# Patient Record
Sex: Male | Born: 1945
Health system: Southern US, Community
[De-identification: ages and names within clinical notes are randomized; demographics above are authoritative.]

## PROBLEM LIST (undated history)

## (undated) DIAGNOSIS — R7303 Prediabetes: Secondary | ICD-10-CM

## (undated) DIAGNOSIS — Z973 Presence of spectacles and contact lenses: Secondary | ICD-10-CM

## (undated) DIAGNOSIS — F319 Bipolar disorder, unspecified: Secondary | ICD-10-CM

## (undated) DIAGNOSIS — F32A Depression, unspecified: Secondary | ICD-10-CM

## (undated) DIAGNOSIS — Z8739 Personal history of other diseases of the musculoskeletal system and connective tissue: Secondary | ICD-10-CM

## (undated) DIAGNOSIS — Z9989 Dependence on other enabling machines and devices: Secondary | ICD-10-CM

## (undated) DIAGNOSIS — N401 Enlarged prostate with lower urinary tract symptoms: Secondary | ICD-10-CM

## (undated) DIAGNOSIS — F329 Major depressive disorder, single episode, unspecified: Secondary | ICD-10-CM

## (undated) DIAGNOSIS — C61 Malignant neoplasm of prostate: Secondary | ICD-10-CM

## (undated) DIAGNOSIS — I10 Essential (primary) hypertension: Secondary | ICD-10-CM

## (undated) DIAGNOSIS — G4733 Obstructive sleep apnea (adult) (pediatric): Secondary | ICD-10-CM

## (undated) DIAGNOSIS — G542 Cervical root disorders, not elsewhere classified: Secondary | ICD-10-CM

## (undated) DIAGNOSIS — M1990 Unspecified osteoarthritis, unspecified site: Secondary | ICD-10-CM

## (undated) HISTORY — DX: Essential (primary) hypertension: I10

## (undated) HISTORY — PX: OTHER SURGICAL HISTORY: SHX169

## (undated) HISTORY — PX: CIRCUMCISION: SUR203

## (undated) HISTORY — PX: TONSILLECTOMY: SUR1361

## (undated) HISTORY — PX: PROSTATE BIOPSY: SHX241

---

## 2006-03-04 HISTORY — PX: KNEE SURGERY: SHX244

## 2011-05-27 ENCOUNTER — Encounter: Payer: Self-pay | Admitting: Neurology

## 2011-05-27 ENCOUNTER — Ambulatory Visit (INDEPENDENT_AMBULATORY_CARE_PROVIDER_SITE_OTHER): Payer: Self-pay | Admitting: Neurology

## 2011-05-27 VITALS — BP 148/88 | HR 88 | Wt 213.0 lb

## 2011-05-27 DIAGNOSIS — M5412 Radiculopathy, cervical region: Secondary | ICD-10-CM

## 2011-05-27 DIAGNOSIS — M5410 Radiculopathy, site unspecified: Secondary | ICD-10-CM

## 2011-05-27 NOTE — Patient Instructions (Addendum)
Your NCV/EMG is scheduled at [facility] located at [address] in [place] on Monday, April 15th at 9:30 am. Please arrive 15 minutes prior to your scheduled appointment.  [phone]    Your MRI is scheduled at [facility] located at [address] (beside the hospital) on Wednesday, March 27th at 11:45.  Please arrive 15 minutes prior to your appointment.  [phone].
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
## 2011-05-29 ENCOUNTER — Ambulatory Visit (HOSPITAL_COMMUNITY)
Admission: RE | Admit: 2011-05-29 | Discharge: 2011-05-29 | Disposition: A | Discharge status: Home / Self Care | Payer: Medicare Other | Source: Ambulatory Visit | Attending: Neurology | Admitting: Neurology

## 2011-05-29 ENCOUNTER — Other Ambulatory Visit: Payer: Self-pay | Admitting: Neurology

## 2011-05-29 DIAGNOSIS — Z9889 Other specified postprocedural states: Secondary | ICD-10-CM

## 2011-05-29 DIAGNOSIS — M5020 Other cervical disc displacement, unspecified cervical region: Secondary | ICD-10-CM | POA: Insufficient documentation

## 2011-05-29 DIAGNOSIS — M79609 Pain in unspecified limb: Secondary | ICD-10-CM | POA: Insufficient documentation

## 2011-05-29 DIAGNOSIS — R209 Unspecified disturbances of skin sensation: Secondary | ICD-10-CM | POA: Insufficient documentation

## 2011-05-29 DIAGNOSIS — M5410 Radiculopathy, site unspecified: Secondary | ICD-10-CM

## 2011-06-21 ENCOUNTER — Telehealth: Payer: Self-pay | Admitting: Neurology

## 2011-06-21 NOTE — Progress Notes (Signed)
EMG/NCS revealed left C7-C8 chronic radic.

## 2011-06-21 NOTE — Telephone Encounter (Signed)
Message copied by Benay Spice on Fri Jun 21, 2011  2:52 PM ------      Message from: Milas Gain      Created: Fri Jun 21, 2011  8:37 AM       Let pt know that his MRI of his C-spine revealed arthritis of the neck and EMG confirmed that that arthritis is likely pushing on a nerve root in his neck.  If he is still having pain I would recommend epidural steroid injections - would he like to have [place] set that up.

## 2011-06-21 NOTE — Telephone Encounter (Signed)
Left a message for the patient to return my call.  

## 2011-06-24 ENCOUNTER — Other Ambulatory Visit: Payer: Self-pay | Admitting: Neurology

## 2011-06-24 DIAGNOSIS — M79602 Pain in left arm: Secondary | ICD-10-CM

## 2011-06-24 DIAGNOSIS — R200 Anesthesia of skin: Secondary | ICD-10-CM

## 2011-06-24 NOTE — Telephone Encounter (Signed)
Called and spoke with the patient earlier today. He does wish to proceed with the ESI. Order placed in EPIC and then called Danielle at [facility] to schedule. She will call the patient to set that up and then call me back with the appointment date and time.

## 2011-06-24 NOTE — Telephone Encounter (Signed)
The patient is scheduled for the [facility] tomorrow at 0815.

## 2011-06-25 ENCOUNTER — Other Ambulatory Visit: Payer: Medicare Other

## 2011-06-25 ENCOUNTER — Ambulatory Visit
Admission: RE | Admit: 2011-06-25 | Discharge: 2011-06-25 | Disposition: A | Discharge status: Home / Self Care | Payer: Medicare Other | Source: Ambulatory Visit | Attending: Neurology | Admitting: Neurology

## 2011-06-25 VITALS — BP 144/74 | HR 79

## 2011-06-25 DIAGNOSIS — M79602 Pain in left arm: Secondary | ICD-10-CM

## 2011-06-25 DIAGNOSIS — M5126 Other intervertebral disc displacement, lumbar region: Secondary | ICD-10-CM

## 2011-06-25 DIAGNOSIS — M5020 Other cervical disc displacement, unspecified cervical region: Secondary | ICD-10-CM

## 2011-06-25 DIAGNOSIS — R200 Anesthesia of skin: Secondary | ICD-10-CM

## 2011-06-25 MED ORDER — IOHEXOL 300 MG/ML  SOLN
1.0000 mL | Freq: Once | INTRAMUSCULAR | Status: AC | PRN
  Administered 2011-06-25: 1 mL via EPIDURAL

## 2011-06-25 MED ORDER — TRIAMCINOLONE ACETONIDE 40 MG/ML IJ SUSP (RADIOLOGY)
60.0000 mg | Freq: Once | INTRAMUSCULAR | Status: AC
  Administered 2011-06-25: 60 mg via EPIDURAL

## 2011-06-25 NOTE — Discharge Instructions (Signed)

## 2011-06-26 ENCOUNTER — Encounter: Payer: Self-pay | Admitting: Neurology

## 2011-07-01 ENCOUNTER — Other Ambulatory Visit: Payer: Medicare Other

## 2011-07-10 ENCOUNTER — Encounter: Payer: Self-pay | Admitting: Neurology

## 2011-07-10 ENCOUNTER — Ambulatory Visit (INDEPENDENT_AMBULATORY_CARE_PROVIDER_SITE_OTHER): Payer: Medicare Other | Admitting: Neurology

## 2011-07-10 VITALS — BP 144/70 | HR 92 | Ht 68.5 in | Wt 211.0 lb

## 2011-07-10 DIAGNOSIS — M5412 Radiculopathy, cervical region: Secondary | ICD-10-CM
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
## 2011-08-04 ENCOUNTER — Emergency Department (HOSPITAL_COMMUNITY): Payer: Medicare Other

## 2011-08-04 ENCOUNTER — Encounter (HOSPITAL_COMMUNITY): Payer: Self-pay | Admitting: Emergency Medicine

## 2011-08-04 ENCOUNTER — Emergency Department (HOSPITAL_COMMUNITY)
Admission: EM | Admit: 2011-08-04 | Discharge: 2011-08-04 | Disposition: A | Discharge status: Home / Self Care | Payer: Medicare Other | Attending: Emergency Medicine | Admitting: Emergency Medicine

## 2011-08-04 DIAGNOSIS — Z79899 Other long term (current) drug therapy: Secondary | ICD-10-CM | POA: Insufficient documentation

## 2011-08-04 DIAGNOSIS — R109 Unspecified abdominal pain: Secondary | ICD-10-CM

## 2011-08-04 DIAGNOSIS — I10 Essential (primary) hypertension: Secondary | ICD-10-CM | POA: Insufficient documentation

## 2011-08-04 MED ORDER — PROMETHAZINE HCL 25 MG PO TABS: 25.0000 mg | ORAL_TABLET | Freq: Four times a day (QID) | ORAL | Status: DC | PRN

## 2011-08-04 NOTE — ED Notes (Signed)
Pt c/o abdominal pain and nausea that began around 7pm. Pt states he feels bloated. Pt denies vomiting or diarrhea at this time. Pt states he attempted to have BM but was unable. Pt states he feels weak.

## 2011-08-04 NOTE — Discharge Instructions (Signed)
Drink plenty of fluids (clear liquids) the next 12-24 hours then start the BRAT diet. . Use the phenergan for nausea or vomiting. Take imodium OTC for diarrhea if it occurs. Avoid mild products until the diarrhea is gone. Recheck if you get worse. ]
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
## 2013-03-12 ENCOUNTER — Ambulatory Visit (HOSPITAL_BASED_OUTPATIENT_CLINIC_OR_DEPARTMENT_OTHER): Payer: Medicare Other | Attending: Neurology

## 2013-03-12 VITALS — Ht 68.0 in | Wt 225.0 lb

## 2013-03-12 DIAGNOSIS — R0989 Other specified symptoms and signs involving the circulatory and respiratory systems: Secondary | ICD-10-CM | POA: Insufficient documentation

## 2013-03-12 DIAGNOSIS — Z9989 Dependence on other enabling machines and devices: Secondary | ICD-10-CM

## 2013-03-12 DIAGNOSIS — G4733 Obstructive sleep apnea (adult) (pediatric): Secondary | ICD-10-CM | POA: Insufficient documentation

## 2013-03-12 DIAGNOSIS — R0609 Other forms of dyspnea: Secondary | ICD-10-CM | POA: Insufficient documentation

## 2013-03-14 DIAGNOSIS — G4733 Obstructive sleep apnea (adult) (pediatric): Secondary | ICD-10-CM

## 2013-03-14 NOTE — Sleep Study (Signed)
   NAME: Paul Moon DATE OF BIRTH:  [birth year] MEDICAL RECORD NUMBER [id number]  LOCATION: [facility]  PHYSICIAN: Mahitha Hickling D  DATE OF STUDY: 03/12/2013  SLEEP STUDY TYPE: Nocturnal Polysomnogram               REFERRING PHYSICIAN: Rosamaria Lints, MD  INDICATION FOR STUDY: Hypersomnia with sleep apnea   EPWORTH SLEEPINESS SCORE:   7/24 HEIGHT: 5\' 8"  (172.7 cm)  WEIGHT: 102.059 kg (225 lb)    Body mass index is 34.22 kg/(m^2).  NECK SIZE: 17 in.  MEDICATIONS: Charted for review  SLEEP ARCHITECTURE: Split study protocol. During the diagnostic phase total sleep time 141 minutes with sleep efficiency 87.9%. Stage I was 2.5%, stage II 76.2%, stage III absent, REM 21.3% of total sleep time. Sleep latency 7 minutes, REM latency 62 minutes, awake after sleep onset 12.5 minutes, arousal index 1.7. Bedtime medication: None  RESPIRATORY DATA: Split study protocol. Apnea hypopneas syndrome (AHI) 26.8 per hour. A total of 63 events was scored including 7 obstructive apneas, 1 central apnea, 3 mixed apneas, 52 hypopneas. He events were not positional. REM AHI 62 per hour. CPAP was titrated to 13 CWP, AHI 0 per hour. He wore a medium ResMed air fit F10 mask with heated humidifier.  OXYGEN DATA: Moderate snoring before CPAP with oxygen desaturation to a nadir of 79% on room. With CPAP control, snoring was prevented and mean oxygen saturation of 92.3%  CARDIAC DATA: Sinus rhythm  MOVEMENT/PARASOMNIA: No significant movement disturbance. Bathroom x1  IMPRESSION/ RECOMMENDATION:   1) Moderate obstructive sleep apnea/hypopneassyndrome, AHI 26.8 per hour with non-positional events. Moderate snoring with oxygen desaturation to a nadir of 79% on room air. 2) Successful CPAP titration to 13 CWP, AHI 0 per hour. He wore a medium ResMed air fit F10 fullface mask with heated humidifier. Snoring was prevented and mean oxygen saturation held 92.3% on room air with CPAP.    Signed Baird Lyons M.D. Deneise Lever Diplomate, American Board of Sleep Medicine  ELECTRONICALLY SIGNED ON:  03/14/2013, 4:08 PM [place] PH: [phone]   FX: [phone] [place]

## 2013-03-22 ENCOUNTER — Ambulatory Visit (INDEPENDENT_AMBULATORY_CARE_PROVIDER_SITE_OTHER): Payer: Medicare Other | Admitting: Diagnostic Neuroimaging

## 2013-03-22 ENCOUNTER — Encounter: Payer: Self-pay | Admitting: Diagnostic Neuroimaging

## 2013-03-22 ENCOUNTER — Encounter (INDEPENDENT_AMBULATORY_CARE_PROVIDER_SITE_OTHER): Payer: Self-pay

## 2013-03-22 VITALS — BP 118/66 | HR 70 | Temp 97.7°F | Ht 68.5 in | Wt 231.5 lb

## 2013-03-22 DIAGNOSIS — R51 Headache: Secondary | ICD-10-CM

## 2013-03-22 DIAGNOSIS — R259 Unspecified abnormal involuntary movements: Secondary | ICD-10-CM

## 2013-03-22 DIAGNOSIS — R251 Tremor, unspecified: Secondary | ICD-10-CM

## 2013-03-22 DIAGNOSIS — R519 Headache, unspecified: Secondary | ICD-10-CM | POA: Insufficient documentation

## 2013-03-22 NOTE — Patient Instructions (Signed)
I will check additional testing. Continue propranolol.

## 2013-03-22 NOTE — Progress Notes (Signed)
[facility]  PATIENT: Paul Moon DOB: 08-Apr-1945  REFERRING CLINICIAN: Noah Delaine HISTORY FROM: patient  REASON FOR VISIT: new consult   HISTORICAL  CHIEF COMPLAINT:  Chief Complaint  Patient presents with  . Tremors    HISTORY OF PRESENT ILLNESS:   68 old right-handed male with history of A., hypertension, here for evaluation of tremor.  6-8 weeks ago patient noted intermittent postural and action tremor left hand. 4 weeks ago this has progressed to his right hand. His handwriting, fine coordination, and is very happy affected. He also notes mild voice tremor, especially at the end of a sentence. Voice tremor also worse at end of day. No change with alcohol use. No significant triggering or alleviating factors. He drinks 2 drinks of caffeine per month.  Patient hasn't some mild balance and walking difficulty, such as uneven surfaces. He has a tendency to stumble. Patient has been talking in his sleep more according to his significant other. No change in smell or taste.   Patient having some nonspecific, intermittent headaches, mild to moderate, new onset for the last 2-3 weeks. No photophobia phonophobia nausea or vomiting.   REVIEW OF SYSTEMS: Full 14 system review of systems performed and notable only for headache weakness slurred speech tremor snoring apnea joint swelling skin moles.  ALLERGIES: No Known Allergies  HOME MEDICATIONS: Outpatient Prescriptions Prior to Visit  Medication Sig Dispense Refill  . aspirin 81 MG tablet Take 81 mg by mouth daily.      . Cyanocobalamin (VITAMIN B 12 PO) Take by mouth.      . dutasteride (AVODART) 0.5 MG capsule Take 0.5 mg by mouth daily.      . fish oil-omega-3 fatty acids 1000 MG capsule Take 2 g by mouth daily.      Marland Kitchen losartan (COZAAR) 100 MG tablet Take 100 mg by mouth daily.      . Multiple Vitamin (MULITIVITAMIN WITH MINERALS) TABS Take 1 tablet by mouth daily.      . tadalafil (CIALIS) 5 MG  tablet Take 5 mg by mouth daily.       . verapamil (VERELAN PM) 180 MG 24 hr capsule Take 180 mg by mouth at bedtime.      . promethazine (PHENERGAN) 25 MG tablet Take 1 tablet (25 mg total) by mouth every 6 (six) hours as needed for nausea.  6 tablet  0   No facility-administered medications prior to visit.    PAST MEDICAL HISTORY: Past Medical History  Diagnosis Date  . Hypertension   . Pneumonia   . Enlarged prostate     PAST SURGICAL HISTORY: Past Surgical History  Procedure Laterality Date  . Knee surgery  2008    right  . Tonsillectomy    . Circumcision      FAMILY HISTORY: Family History  Problem Relation Age of Onset  . Renal cancer Father   . Heart attack Paternal Grandmother   . Heart attack Paternal Grandfather     SOCIAL HISTORY:  History   Social History  . Marital Status: Divorced    Spouse Name: N/A    Number of Children: 2  . Years of Education: College   Occupational History  . Semi-Retired    Social History Main Topics  . Smoking status: Former Smoker    Quit date: 05/26/1985  . Smokeless tobacco: Never Used  . Alcohol Use: Yes     Comment: wine from time to time  . Drug Use: Not on file  .  Sexual Activity: Not on file   Other Topics Concern  . Not on file   Social History Narrative   Patient lives at home alone.   Caffeine Use: occasionally     PHYSICAL EXAM  Filed Vitals:   03/22/13 0856  BP: 118/66  Pulse: 70  Temp: 97.7 F (36.5 C)  TempSrc: Oral  Height: 5' 8.5" (1.74 m)  Weight: 231 lb 8 oz (105.008 kg)    Not recorded    Body mass index is 34.68 kg/(m^2).  GENERAL EXAM: Patient is in no distress; well developed, nourished and groomed; neck is supple  CARDIOVASCULAR: Regular rate and rhythm, no murmurs, no carotid bruits  NEUROLOGIC: MENTAL STATUS: awake, alert, oriented to person, place and time, recent and remote memory intact, normal attention and concentration, language fluent, comprehension intact,  naming intact, fund of knowledge appropriate; NO FRONTAL RELEASE SINGS. CRANIAL NERVE: no papilledema on fundoscopic exam, pupils equal and reactive to light, visual fields full to confrontation, extraocular muscles intact, no nystagmus, facial sensation and strength symmetric, hearing intact, palate elevates symmetrically, uvula midline, shoulder shrug symmetric, tongue midline. MOTOR: normal bulk and tone, full strength in the BUE, BLE; MILD BRADYKINESIA IN BUE. HANDWRITING SAMPLE NOTABLE FOR TREMOR, HESITANCY. SENSORY: normal and symmetric to light touch, pinprick, temperature, vibration; EXCEPT SLIGHTLY DECR PP IN LEFT HAND DIGITS 3-5. COORDINATION: finger-nose-finger, fine finger movements normal REFLEXES: deep tendon reflexes TRACE and symmetric GAIT/STATION: narrow based gait; able to walk on toes, heels and tandem; romberg is negative    DIAGNOSTIC DATA (LABS, IMAGING, TESTING) - I reviewed patient records, labs, notes, testing and imaging myself where available.  No results found for this basename: WBC, HGB, HCT, MCV, PLT   No results found for this basename: na, k, cl, co2, glucose, bun, creatinine, calcium, prot, albumin, ast, alt, alkphos, bilitot, gfrnonaa, gfraa   No results found for this basename: CHOL, HDL, LDLCALC, LDLDIRECT, TRIG, CHOLHDL   No results found for this basename: HGBA1C   No results found for this basename: VITAMINB12   No results found for this basename: TSH     ASSESSMENT AND PLAN  68 y.o. year old male here with postural and action tremor (BUE) x 6-8 weeks. Exam notable for action and postural tremor of bilateral upper extremities with mild bradykinesia in upper extremities. Otherwise unremarkable neurologic exam. We'll proceed with additional testing. New-onset headache + tremor needs MRI scan for evaluation. Continue propranolol for now. May need to increase propranolol to the future or possibly add on primidone.  Ddx: essential tremor vs secondary  tremor (CNS vascular, structural, inflamm, medication side effect, metabolic, neuromuscular junction)  PLAN: Orders Placed This Encounter  Procedures  . MR Brain W Wo Contrast  . TSH  . Acetylcholine Receptor, Binding  . Valproic acid level  . Ammonia  . Lamotrigine level   Return in about 2 months (around 05/20/2013).    Penni Bombard, MD 08/18/735, 1:06 AM Certified in Neurology, Neurophysiology and Neuroimaging  [facility] [address] [place], [place] [address] [phone]

## 2013-03-31 ENCOUNTER — Ambulatory Visit
Admission: RE | Admit: 2013-03-31 | Discharge: 2013-03-31 | Disposition: A | Discharge status: Home / Self Care | Payer: Medicare Other | Source: Ambulatory Visit | Attending: Diagnostic Neuroimaging | Admitting: Diagnostic Neuroimaging

## 2013-03-31 DIAGNOSIS — R51 Headache: Secondary | ICD-10-CM

## 2013-03-31 DIAGNOSIS — R259 Unspecified abnormal involuntary movements: Secondary | ICD-10-CM

## 2013-03-31 DIAGNOSIS — R251 Tremor, unspecified: Secondary | ICD-10-CM

## 2013-03-31 MED ORDER — GADOBENATE DIMEGLUMINE 529 MG/ML IV SOLN
20.0000 mL | Freq: Once | INTRAVENOUS | Status: AC | PRN
  Administered 2013-03-31: 20 mL via INTRAVENOUS

## 2013-04-01 LAB — TSH: TSH: 2.62 u[IU]/mL (ref 0.450–4.500)

## 2013-04-01 LAB — ACETYLCHOLINE RECEPTOR, BINDING: ACHR BINDING AB, SERUM: 0.04 nmol/L (ref 0.00–0.24)

## 2013-04-01 LAB — AMMONIA: Ammonia: 105 ug/dL — ABNORMAL HIGH (ref 27–102)

## 2013-04-01 LAB — VALPROIC ACID LEVEL: VALPROIC ACID LVL: 76 ug/mL (ref 50–100)

## 2013-04-01 LAB — LAMOTRIGINE LEVEL: LAMOTRIGINE LVL: 5.5 ug/mL (ref 2.0–20.0)

## 2013-04-09 ENCOUNTER — Encounter: Payer: Self-pay | Admitting: Internal Medicine

## 2013-04-09 ENCOUNTER — Ambulatory Visit (INDEPENDENT_AMBULATORY_CARE_PROVIDER_SITE_OTHER): Payer: Medicare Other | Admitting: Internal Medicine

## 2013-04-09 VITALS — BP 118/80 | HR 62 | Ht 68.0 in | Wt 231.0 lb

## 2013-04-09 DIAGNOSIS — G4733 Obstructive sleep apnea (adult) (pediatric): Secondary | ICD-10-CM | POA: Insufficient documentation

## 2013-04-09 NOTE — Progress Notes (Signed)
Subjective:    Patient ID: Paul Moon, male    DOB: 1945-03-19, 68 y.o.   MRN: [id number]  HPI 04/09/13- 68 yoM former smoker Referred by Dr Dustin Flock for sleep medicine evaluation. Epworth Score: 3/ 24 NPSG 03/12/13- Moderate obstructive sleep apnea, AHI 26.8/ hr, desat to 79%. CPAP titrated to 13/ AHI 0/ hr, weight 225 lbs. He gives a history of chronic loud snoring, witnessed apneas and daytime tiredness. Consent has fall asleep easily. Little caffeine. Bedtime between 10 and 12 PM, sleep latency 15 minutes, waking once or twice before up between 6 and 7 AM. ENT surgery limited to tonsils. Treated for hypertension. He denies heart lung or thyroid problems, or significant nasal congestion. Family history negative for sleep apnea.  Prior to Admission medications   Medication Sig Start Date End Date Taking? Authorizing Provider  aspirin 81 MG tablet Take 81 mg by mouth daily.   Yes Historical Provider, MD  Cyanocobalamin (VITAMIN B 12 PO) Take by mouth.   Yes Historical Provider, MD  divalproex (DEPAKOTE ER) 500 MG 24 hr tablet Take 1 tablet by mouth daily. 03/14/13  Yes Historical Provider, MD  dutasteride (AVODART) 0.5 MG capsule Take 0.5 mg by mouth daily.   Yes Historical Provider, MD  fish oil-omega-3 fatty acids 1000 MG capsule Take 2 g by mouth daily.   Yes Historical Provider, MD  lamoTRIgine (LAMICTAL) 25 MG tablet Take 25 mg by mouth daily.   Yes Historical Provider, MD  losartan (COZAAR) 100 MG tablet Take 100 mg by mouth daily.   Yes Historical Provider, MD  Multiple Vitamin (MULITIVITAMIN WITH MINERALS) TABS Take 1 tablet by mouth daily.   Yes Historical Provider, MD  promethazine (PHENERGAN) 25 MG tablet Take 1 tablet (25 mg total) by mouth every 6 (six) hours as needed for nausea. 08/04/11 04/09/14 Yes Janice Norrie, MD  tadalafil (CIALIS) 5 MG tablet Take 5 mg by mouth daily.    Yes Historical Provider, MD  verapamil (VERELAN PM) 180 MG 24 hr capsule Take 180 mg by mouth at  bedtime.   Yes Historical Provider, MD   Past Medical History  Diagnosis Date  . Hypertension   . Pneumonia   . Enlarged prostate    Past Surgical History  Procedure Laterality Date  . Knee surgery  2008    right  . Tonsillectomy    . Circumcision     Family History  Problem Relation Age of Onset  . Renal cancer Father   . Heart attack Paternal Grandmother   . Heart attack Paternal Grandfather    History   Social History  . Marital Status: Divorced    Spouse Name: N/A    Number of Children: 2  . Years of Education: College   Occupational History  . Semi-Retired    Social History Main Topics  . Smoking status: Former Smoker -- 1.50 packs/day for 20 years    Types: Cigarettes    Quit date: 05/26/1985  . Smokeless tobacco: Never Used  . Alcohol Use: Yes     Comment: wine from time to time  . Drug Use: Not on file  . Sexual Activity: Not on file   Other Topics Concern  . Not on file   Social History Narrative   Patient lives at home alone.   Caffeine Use: occasionally    Review of Systems  Constitutional: Negative for fever, chills, diaphoresis, activity change, appetite change, fatigue and unexpected weight change.  HENT: Negative for congestion,  dental problem, ear discharge, ear pain, facial swelling, hearing loss, mouth sores, nosebleeds, postnasal drip, rhinorrhea, sinus pressure, sneezing, sore throat, tinnitus, trouble swallowing and voice change.   Eyes: Negative for photophobia, discharge, itching and visual disturbance.  Respiratory: Negative for apnea, cough, choking, chest tightness, shortness of breath, wheezing and stridor.   Cardiovascular: Negative for chest pain, palpitations and leg swelling.  Gastrointestinal: Negative for nausea, vomiting, abdominal pain, constipation, blood in stool and abdominal distention.  Genitourinary: Negative for dysuria, urgency, frequency, hematuria, flank pain, decreased urine volume and difficulty urinating.   Musculoskeletal: Negative for arthralgias, back pain, gait problem, joint swelling, myalgias, neck pain and neck stiffness.  Skin: Negative for color change, pallor and rash.  Neurological: Negative for dizziness, tremors, seizures, syncope, speech difficulty, weakness, light-headedness, numbness and headaches.  Hematological: Negative for adenopathy. Does not bruise/bleed easily.  Psychiatric/Behavioral: Negative for confusion, sleep disturbance and agitation. The patient is not nervous/anxious.        Objective:   Physical Exam  OBJ- Physical Exam General- Alert, Oriented, Affect-appropriate/ reserved, Distress- none acute, overweight Skin- rash-none, lesions- none, excoriation- none Lymphadenopathy- none Head- atraumatic            Eyes- Gross vision intact, PERRLA, conjunctivae and secretions clear            Ears- Hearing, canals-normal            Nose- Clear, no-Septal dev, mucus, polyps, erosion, perforation             Throat- Mallampati III , mucosa clear , drainage- none, tonsils- atrophic Neck- flexible , trachea midline, no stridor , thyroid nl, carotid no bruit Chest - symmetrical excursion , unlabored           Heart/CV- RRR , no murmur , no gallop  , no rub, nl s1 s2                           - JVD- none , edema- none, stasis changes- none, varices- none           Lung- + few crackles (recent bronchitis), wheeze- none, cough- none , dullness-none,                  rub- none           Chest wall-  Abd- tender-no, distended-no, bowel sounds-present, HSM- no Br/ Gen/ Rectal- Not done, not indicated Extrem- cyanosis- none, clubbing, none, atrophy- none, strength- nl Neuro- grossly intact to observation    Assessment & Plan:

## 2013-04-09 NOTE — Assessment & Plan Note (Signed)
Extended discussion lasting 30 minutes, revealing physiology of obstructive sleep apnea, recognition issues, medical consequences, good sleep hygiene, responsibility to drive safely, treatment options. He responded well to CPAP at 13. Plan-start home trial of CPAP at 13 CWP as discussed

## 2013-04-09 NOTE — Patient Instructions (Signed)
Order- New DME new CPAP 13, mask of choice, humidifier, supplies   Please call as needed

## 2013-04-20 ENCOUNTER — Telehealth: Payer: Self-pay | Admitting: Diagnostic Neuroimaging

## 2013-04-20 ENCOUNTER — Other Ambulatory Visit: Payer: Self-pay | Admitting: Orthopedic Surgery

## 2013-04-20 NOTE — Telephone Encounter (Signed)
I called patient. No answer. Trying to review lab results. -VRP

## 2013-04-20 NOTE — Progress Notes (Signed)
Please put orders in Epic surgery 04-28-13 pre op visit [phone] Thank you!

## 2013-04-22 ENCOUNTER — Encounter (HOSPITAL_COMMUNITY): Payer: Self-pay | Admitting: Pharmacy Technician

## 2013-04-23 ENCOUNTER — Institutional Professional Consult (permissible substitution): Payer: Medicare Other | Admitting: Internal Medicine

## 2013-04-26 ENCOUNTER — Encounter (HOSPITAL_COMMUNITY)
Admission: RE | Admit: 2013-04-26 | Discharge: 2013-04-26 | Disposition: A | Discharge status: Home / Self Care | Payer: Medicare Other | Source: Ambulatory Visit | Attending: Orthopedic Surgery | Admitting: Orthopedic Surgery

## 2013-04-26 ENCOUNTER — Encounter (HOSPITAL_COMMUNITY): Payer: Self-pay

## 2013-04-26 HISTORY — DX: Unspecified osteoarthritis, unspecified site: M19.90

## 2013-04-26 HISTORY — DX: Cervical root disorders, not elsewhere classified: G54.2

## 2013-04-26 LAB — BASIC METABOLIC PANEL
BUN: 13 mg/dL (ref 6–23)
CO2: 30 mEq/L (ref 19–32)
Calcium: 10 mg/dL (ref 8.4–10.5)
Chloride: 100 mEq/L (ref 96–112)
Creatinine, Ser: 1.44 mg/dL — ABNORMAL HIGH (ref 0.50–1.35)
GFR, EST AFRICAN AMERICAN: 57 mL/min — AB (ref >90)
GFR, EST NON AFRICAN AMERICAN: 49 mL/min — AB (ref >90)
GLUCOSE: 116 mg/dL — AB (ref 70–99)
POTASSIUM: 4.4 meq/L (ref 3.7–5.3)
SODIUM: 140 meq/L (ref 137–147)

## 2013-04-26 LAB — CBC
HCT: 40.2 % (ref 39.0–52.0)
HEMOGLOBIN: 13.1 g/dL (ref 13.0–17.0)
MCH: 30.5 pg (ref 26.0–34.0)
MCHC: 32.6 g/dL (ref 30.0–36.0)
MCV: 93.7 fL (ref 78.0–100.0)
PLATELETS: 273 10*3/uL (ref 150–400)
RBC: 4.29 MIL/uL (ref 4.22–5.81)
RDW: 15.9 % — AB (ref 11.5–15.5)
WBC: 4.4 10*3/uL (ref 4.0–10.5)

## 2013-04-26 NOTE — Pre-Procedure Instructions (Signed)
CXR REPORT IN EPIC FROM 04-05-13. EKG REPORT IN EPIC FROM 06/01/12

## 2013-04-26 NOTE — Patient Instructions (Signed)
   YOUR SURGERY IS SCHEDULED AT [facility]  ON:  Wednesday  2/25  REPORT TO  SHORT STAY CENTER AT:  1:15 PM      PHONE # FOR SHORT STAY IS [phone]  DO NOT EAT  ANYTHING AFTER MIDNIGHT THE NIGHT BEFORE YOUR SURGERY.  YOU MAY BRUSH YOUR TEETH,  NO FOOD, NO CHEWING GUM, NO MINTS, NO CANDIES, NO CHEWING TOBACCO. YOU MAY HAVE CLEAR LIQUIDS TO DRINK FROM MIDNIGHT UNTIL 9:45 AM DAY OF SURGERY  - LIKE WATER,  APPLE , GRAPE OR CRANBERRY JUICE.   NOTHING TO DRINK AFTER 9:45 AM DAY OF SURGERY.  PLEASE TAKE THE FOLLOWING MEDICATIONS THE AM OF YOUR SURGERY WITH A FEW SIPS OF WATER:  DEPAKOTE ( DIVALPROEX ), PROPRANOLOL ( INDERAL ).   IF YOU HAVE SLEEP APNEA AND USE CPAP OR BIPAP--PLEASE BRING THE MASK AND THE TUBING.  DO NOT BRING YOUR MACHINE.  DO NOT BRING VALUABLES, MONEY, CREDIT CARDS.  DO NOT WEAR JEWELRY, MAKE-UP, NAIL POLISH AND NO METAL PINS OR CLIPS IN YOUR HAIR. CONTACT LENS, DENTURES / PARTIALS, GLASSES SHOULD NOT BE WORN TO SURGERY AND IN MOST CASES-HEARING AIDS WILL NEED TO BE REMOVED.  BRING YOUR GLASSES CASE, ANY EQUIPMENT NEEDED FOR YOUR CONTACT LENS. FOR PATIENTS ADMITTED TO THE HOSPITAL--CHECK OUT TIME THE DAY OF DISCHARGE IS 11:00 AM.  ALL INPATIENT ROOMS ARE PRIVATE - WITH BATHROOM, TELEPHONE, TELEVISION AND WIFI INTERNET.  IF YOU ARE BEING DISCHARGED THE SAME DAY OF YOUR SURGERY--YOU CAN NOT DRIVE YOURSELF HOME--AND SHOULD NOT GO HOME ALONE BY TAXI OR BUS.  NO DRIVING OR OPERATING MACHINERY, OR MAKING LEGAL DECISIONS FOR 24 HOURS FOLLOWING ANESTHESIA / PAIN MEDICATIONS.  PLEASE MAKE ARRANGEMENTS FOR SOMEONE TO BE WITH YOU AT HOME THE FIRST 24 HOURS AFTER SURGERY. RESPONSIBLE DRIVER'S NAME / PHONE                     VERNON Masterson - PT'S BROTHER  [place] ANY  FACT SHEETS THAT YOU WERE GIVEN:  INCENTIVE SPIROMETER INFORMATION.  FAILURE TO FOLLOW THESE INSTRUCTIONS MAY RESULT IN THE CANCELLATION OF  YOUR SURGERY. PLEASE BE AWARE THAT YOU MAY NEED ADDITIONAL BLOOD DRAWN DAY OF YOUR SURGERY  PATIENT SIGNATURE_________________________________

## 2013-04-26 NOTE — Pre-Procedure Instructions (Signed)
PT'S BMET REPORT FAXED TO DR. Anne Fu OFFICE - CREATININE 1.44

## 2013-04-27 DIAGNOSIS — S83249A Other tear of medial meniscus, current injury, unspecified knee, initial encounter: Secondary | ICD-10-CM | POA: Diagnosis present

## 2013-04-27 NOTE — Telephone Encounter (Signed)
Called patient to schedule an appt on 05/25/13 per Dr. Leta Baptist. I advised the patient that if he has any other problems, questions or concerns to call the office. Patient verbalized understanding.

## 2013-04-27 NOTE — H&P (Signed)
CC- Paul Moon is a 68 y.o. male who presents with left knee pain.  HPI- . Knee Pain: Patient presents with knee pain involving the  left knee. Onset of the symptoms was several months ago. Inciting event: none known. Current symptoms include giving out, locking, pain located medially and stiffness. Pain is aggravated by lateral movements, pivoting, rising after sitting, squatting and walking.  Patient has had no prior knee problems. Evaluation to date: MRI: abnormal medial meniscal tear. Treatment to date: OTC analgesics which are not very effective.  Past Medical History  Diagnosis Date  . Hypertension   . Enlarged prostate   . Mental disorder     BIPOLAR  . H/O bronchitis JAN 2015    PT FEELS MUCH BETTER - STILL HAS SOME COUGH - BUT NO CHEST CONGESTION  . Pneumonia     2 YRS AGO  . Sleep apnea     USES CPAP  SETTING 4-13  . Arthritis     INDEX FINGER JOINT LEFT HAND  . Pinched cervical nerve root     PT STATES C 7-8 PINCHED NERVE CAUSING NUMBNESS MIDDLE, RING AND LITTLE FINGER LEFT HAND - NO PAIN - STATES HE IS TRYING TO AVOID SURGERY.    Past Surgical History  Procedure Laterality Date  . Knee surgery  2008    right  . Tonsillectomy    . Circumcision      Prior to Admission medications   Medication Sig Start Date End Date Taking? Authorizing Provider  aspirin EC 81 MG tablet Take 81 mg by mouth daily.    Historical Provider, MD  cholecalciferol (VITAMIN D) 1000 UNITS tablet Take 1,000 Units by mouth daily.    Historical Provider, MD  Cyanocobalamin (HM SUPER VITAMIN B12) 2500 MCG CHEW Chew 2,500 mcg by mouth daily.    Historical Provider, MD  divalproex (DEPAKOTE ER) 500 MG 24 hr tablet Take 1 tablet by mouth See admin instructions. Alternates between taking twice daily and three times daily 03/14/13   Historical Provider, MD  dutasteride (AVODART) 0.5 MG capsule Take 0.5 mg by mouth at bedtime.     Historical Provider, MD  lamoTRIgine (LAMICTAL) 200 MG tablet Take 200  mg by mouth every evening.    Historical Provider, MD  losartan (COZAAR) 100 MG tablet Take 100 mg by mouth at bedtime.     Historical Provider, MD  Multiple Vitamin (MULTIVITAMIN WITH MINERALS) TABS tablet Take 1 tablet by mouth daily.    Historical Provider, MD  Omega-3 Fatty Acids (FISH OIL) 1200 MG CAPS Take 1,200 mg by mouth daily.    Historical Provider, MD  propranolol (INDERAL) 60 MG tablet Take 60 mg by mouth 2 (two) times daily.    Historical Provider, MD  tadalafil (CIALIS) 5 MG tablet Take 5 mg by mouth daily as needed for erectile dysfunction.     Historical Provider, MD  verapamil (VERELAN PM) 180 MG 24 hr capsule Take 180 mg by mouth at bedtime.    Historical Provider, MD   KNEE EXAM antalgic gait, soft tissue tenderness over medial joint line, no effusion, negative pivot-shift, collateral ligaments intact  Physical Examination: General appearance - alert, well appearing, and in no distress Mental status - alert, oriented to person, place, and time Chest - clear to auscultation, no wheezes, rales or rhonchi, symmetric air entry Heart - normal rate, regular rhythm, normal S1, S2, no murmurs, rubs, clicks or gallops Abdomen - soft, nontender, nondistended, no masses or organomegaly Neurological - alert,  oriented, normal speech, no focal findings or movement disorder noted   Asessment/Plan--- Left knee medial meniscal tear- - Plan left knee arthroscopy with meniscal debridement. Procedure risks and potential comps discussed with patient who elects to proceed. Goals are decreased pain and increased function with a high likelihood of achieving both

## 2013-04-27 NOTE — Telephone Encounter (Signed)
I called patient with labs and MRI. Borderline elevated ammonia. Needs repeat level at next visit. Please setup follow up visit for 1 month.   Penni Bombard, MD 3/89/3734, 2:87 PM Certified in Neurology, Neurophysiology and Neuroimaging  [facility] [address], [place], [place] [address] [phone]

## 2013-04-28 ENCOUNTER — Encounter (HOSPITAL_COMMUNITY): Payer: Medicare Other | Admitting: *Deleted

## 2013-04-28 ENCOUNTER — Ambulatory Visit (HOSPITAL_COMMUNITY): Payer: Medicare Other | Admitting: *Deleted

## 2013-04-28 ENCOUNTER — Ambulatory Visit (HOSPITAL_COMMUNITY)
Admission: RE | Admit: 2013-04-28 | Discharge: 2013-04-28 | Disposition: A | Discharge status: Home / Self Care | Payer: Medicare Other | Source: Ambulatory Visit | Attending: Orthopedic Surgery | Admitting: Orthopedic Surgery

## 2013-04-28 ENCOUNTER — Encounter (HOSPITAL_COMMUNITY)
Admission: RE | Disposition: A | Discharge status: Home / Self Care | Payer: Self-pay | Source: Ambulatory Visit | Attending: Orthopedic Surgery

## 2013-04-28 ENCOUNTER — Encounter (HOSPITAL_COMMUNITY): Payer: Self-pay | Admitting: *Deleted

## 2013-04-28 DIAGNOSIS — I10 Essential (primary) hypertension: Secondary | ICD-10-CM | POA: Insufficient documentation

## 2013-04-28 DIAGNOSIS — S83249A Other tear of medial meniscus, current injury, unspecified knee, initial encounter: Secondary | ICD-10-CM | POA: Diagnosis present

## 2013-04-28 DIAGNOSIS — G4730 Sleep apnea, unspecified: Secondary | ICD-10-CM | POA: Insufficient documentation

## 2013-04-28 DIAGNOSIS — G589 Mononeuropathy, unspecified: Secondary | ICD-10-CM | POA: Insufficient documentation

## 2013-04-28 DIAGNOSIS — R059 Cough, unspecified: Secondary | ICD-10-CM | POA: Insufficient documentation

## 2013-04-28 DIAGNOSIS — Z87891 Personal history of nicotine dependence: Secondary | ICD-10-CM | POA: Insufficient documentation

## 2013-04-28 DIAGNOSIS — X58XXXA Exposure to other specified factors, initial encounter: Secondary | ICD-10-CM | POA: Insufficient documentation

## 2013-04-28 DIAGNOSIS — Z79899 Other long term (current) drug therapy: Secondary | ICD-10-CM | POA: Insufficient documentation

## 2013-04-28 DIAGNOSIS — F319 Bipolar disorder, unspecified: Secondary | ICD-10-CM | POA: Insufficient documentation

## 2013-04-28 DIAGNOSIS — IMO0002 Reserved for concepts with insufficient information to code with codable children: Secondary | ICD-10-CM | POA: Insufficient documentation

## 2013-04-28 DIAGNOSIS — M2240 Chondromalacia patellae, unspecified knee: Secondary | ICD-10-CM | POA: Insufficient documentation

## 2013-04-28 DIAGNOSIS — Z7982 Long term (current) use of aspirin: Secondary | ICD-10-CM | POA: Insufficient documentation

## 2013-04-28 DIAGNOSIS — Z8701 Personal history of pneumonia (recurrent): Secondary | ICD-10-CM | POA: Insufficient documentation

## 2013-04-28 DIAGNOSIS — M19049 Primary osteoarthritis, unspecified hand: Secondary | ICD-10-CM | POA: Insufficient documentation

## 2013-04-28 DIAGNOSIS — N400 Enlarged prostate without lower urinary tract symptoms: Secondary | ICD-10-CM | POA: Insufficient documentation

## 2013-04-28 DIAGNOSIS — R05 Cough: Secondary | ICD-10-CM | POA: Insufficient documentation

## 2013-04-28 HISTORY — PX: KNEE ARTHROSCOPY: SHX127

## 2013-04-28 SURGERY — ARTHROSCOPY, KNEE
Anesthesia: General | Site: Knee | Laterality: Left

## 2013-04-28 MED ORDER — ONDANSETRON HCL 4 MG/2ML IJ SOLN
INTRAMUSCULAR | Status: AC
  Filled 2013-04-28: qty 2

## 2013-04-28 MED ORDER — CEFAZOLIN SODIUM-DEXTROSE 2-3 GM-% IV SOLR
INTRAVENOUS | Status: AC
  Filled 2013-04-28: qty 50

## 2013-04-28 MED ORDER — DEXAMETHASONE SODIUM PHOSPHATE 10 MG/ML IJ SOLN: 10.0000 mg | Freq: Once | INTRAMUSCULAR | Status: DC

## 2013-04-28 MED ORDER — BUPIVACAINE-EPINEPHRINE 0.25% -1:200000 IJ SOLN
INTRAMUSCULAR | Status: DC | PRN
  Administered 2013-04-28: 20 mL

## 2013-04-28 MED ORDER — CEFAZOLIN SODIUM-DEXTROSE 2-3 GM-% IV SOLR
2.0000 g | INTRAVENOUS | Status: AC
  Administered 2013-04-28: 2 g via INTRAVENOUS

## 2013-04-28 MED ORDER — ACETAMINOPHEN 10 MG/ML IV SOLN
1000.0000 mg | Freq: Once | INTRAVENOUS | Status: AC
  Administered 2013-04-28: 1000 mg via INTRAVENOUS
  Filled 2013-04-28: qty 100

## 2013-04-28 MED ORDER — PROPOFOL 10 MG/ML IV BOLUS
INTRAVENOUS | Status: AC
  Filled 2013-04-28: qty 20

## 2013-04-28 MED ORDER — MIDAZOLAM HCL 2 MG/2ML IJ SOLN
INTRAMUSCULAR | Status: AC
  Filled 2013-04-28: qty 2

## 2013-04-28 MED ORDER — BUPIVACAINE-EPINEPHRINE PF 0.25-1:200000 % IJ SOLN
INTRAMUSCULAR | Status: AC
  Filled 2013-04-28: qty 30

## 2013-04-28 MED ORDER — HYDROCODONE-ACETAMINOPHEN 5-325 MG PO TABS: 1.0000 | ORAL_TABLET | Freq: Four times a day (QID) | ORAL | Status: DC | PRN

## 2013-04-28 MED ORDER — LACTATED RINGERS IR SOLN
Status: DC | PRN
  Administered 2013-04-28: 2

## 2013-04-28 MED ORDER — PROPOFOL 10 MG/ML IV BOLUS
INTRAVENOUS | Status: DC | PRN
  Administered 2013-04-28: 200 mg via INTRAVENOUS

## 2013-04-28 MED ORDER — EPHEDRINE SULFATE 50 MG/ML IJ SOLN
INTRAMUSCULAR | Status: DC | PRN
  Administered 2013-04-28: 10 mg via INTRAVENOUS

## 2013-04-28 MED ORDER — CHLORHEXIDINE GLUCONATE 4 % EX LIQD: 60.0000 mL | Freq: Once | CUTANEOUS | Status: DC

## 2013-04-28 MED ORDER — ONDANSETRON HCL 4 MG/2ML IJ SOLN
INTRAMUSCULAR | Status: DC | PRN
  Administered 2013-04-28: 4 mg via INTRAVENOUS

## 2013-04-28 MED ORDER — LIDOCAINE HCL 1 % IJ SOLN
INTRAMUSCULAR | Status: DC | PRN
  Administered 2013-04-28: 50 mg via INTRADERMAL

## 2013-04-28 MED ORDER — SODIUM CHLORIDE 0.9 % IV SOLN: INTRAVENOUS | Status: DC

## 2013-04-28 MED ORDER — FENTANYL CITRATE 0.05 MG/ML IJ SOLN
INTRAMUSCULAR | Status: AC
  Filled 2013-04-28: qty 2

## 2013-04-28 MED ORDER — FENTANYL CITRATE 0.05 MG/ML IJ SOLN: 25.0000 ug | INTRAMUSCULAR | Status: DC | PRN

## 2013-04-28 MED ORDER — LIDOCAINE HCL (CARDIAC) 20 MG/ML IV SOLN
INTRAVENOUS | Status: AC
  Filled 2013-04-28: qty 5

## 2013-04-28 MED ORDER — FENTANYL CITRATE 0.05 MG/ML IJ SOLN
INTRAMUSCULAR | Status: DC | PRN
  Administered 2013-04-28: 100 ug via INTRAVENOUS

## 2013-04-28 MED ORDER — LACTATED RINGERS IV SOLN
INTRAVENOUS | Status: DC | PRN
  Administered 2013-04-28 (×2): via INTRAVENOUS

## 2013-04-28 MED ORDER — MIDAZOLAM HCL 5 MG/5ML IJ SOLN
INTRAMUSCULAR | Status: DC | PRN
  Administered 2013-04-28: 2 mg via INTRAVENOUS

## 2013-04-28 MED ORDER — LACTATED RINGERS IV SOLN
INTRAVENOUS | Status: DC
  Administered 2013-04-28: 1000 mL via INTRAVENOUS

## 2013-04-28 MED ORDER — METHOCARBAMOL 500 MG PO TABS: 500.0000 mg | ORAL_TABLET | Freq: Four times a day (QID) | ORAL | Status: DC

## 2013-04-28 MED ORDER — PHENYLEPHRINE HCL 10 MG/ML IJ SOLN
INTRAMUSCULAR | Status: DC | PRN
  Administered 2013-04-28: 80 ug via INTRAVENOUS

## 2013-04-28 MED ORDER — DEXAMETHASONE SODIUM PHOSPHATE 10 MG/ML IJ SOLN
INTRAMUSCULAR | Status: AC
  Filled 2013-04-28: qty 1

## 2013-04-28 SURGICAL SUPPLY — 29 items
BANDAGE ELASTIC 6 VELCRO ST LF (GAUZE/BANDAGES/DRESSINGS) ×2 IMPLANT
BLADE 4.2CUDA (BLADE) ×2 IMPLANT
CLOTH BEACON ORANGE TIMEOUT ST (SAFETY) ×2 IMPLANT
COUNTER NEEDLE 20 DBL MAG RED (NEEDLE) ×2 IMPLANT
CUFF TOURN SGL QUICK 34 (TOURNIQUET CUFF) ×1
CUFF TRNQT CYL 34X4X40X1 (TOURNIQUET CUFF) ×1 IMPLANT
DRAPE U-SHAPE 47X51 STRL (DRAPES) ×2 IMPLANT
DRSG ADAPTIC 3X8 NADH LF (GAUZE/BANDAGES/DRESSINGS) ×2 IMPLANT
DRSG EMULSION OIL 3X3 NADH (GAUZE/BANDAGES/DRESSINGS) ×2 IMPLANT
DURAPREP 26ML APPLICATOR (WOUND CARE) ×2 IMPLANT
GLOVE BIO SURGEON STRL SZ8 (GLOVE) ×2 IMPLANT
GLOVE BIOGEL PI IND STRL 8 (GLOVE) ×1 IMPLANT
GLOVE BIOGEL PI INDICATOR 8 (GLOVE) ×1
GOWN STRL REUS W/TWL LRG LVL3 (GOWN DISPOSABLE) ×2 IMPLANT
MANIFOLD NEPTUNE II (INSTRUMENTS) ×2 IMPLANT
PACK ARTHROSCOPY WL (CUSTOM PROCEDURE TRAY) ×2 IMPLANT
PACK ICE MAXI GEL EZY WRAP (MISCELLANEOUS) ×6 IMPLANT
PAD ABD 8X10 STRL (GAUZE/BANDAGES/DRESSINGS) ×2 IMPLANT
PADDING CAST ABS 6INX4YD NS (CAST SUPPLIES) ×1
PADDING CAST ABS COTTON 6X4 NS (CAST SUPPLIES) ×1 IMPLANT
PADDING CAST COTTON 6X4 STRL (CAST SUPPLIES) ×4 IMPLANT
POSITIONER SURGICAL ARM (MISCELLANEOUS) ×2 IMPLANT
SET ARTHROSCOPY TUBING (MISCELLANEOUS) ×1
SET ARTHROSCOPY TUBING LN (MISCELLANEOUS) ×1 IMPLANT
SPONGE GAUZE 4X4 12PLY (GAUZE/BANDAGES/DRESSINGS) ×2 IMPLANT
SUT ETHILON 4 0 PS 2 18 (SUTURE) ×2 IMPLANT
TOWEL OR 17X26 10 PK STRL BLUE (TOWEL DISPOSABLE) ×2 IMPLANT
WAND 90 DEG TURBOVAC W/CORD (SURGICAL WAND) IMPLANT
WRAP KNEE MAXI GEL POST OP (GAUZE/BANDAGES/DRESSINGS) ×2 IMPLANT

## 2013-04-28 NOTE — Op Note (Signed)
Preoperative diagnosis-  Left knee medial meniscal tear  Postoperative diagnosis Left- knee medial meniscal tear plus Left medial femoral chondral defect  Procedure- Left knee arthroscopy with medial meniscal debridement and chondroplasty   Surgeon- Dione Plover. Paul Vancuren, MD  Anesthesia-General  EBL-  minimal Complications- None  Condition- PACU - hemodynamically stable.  Brief clinical note- -Paul Moon is a 68 y.o.  male with a several month history of left knee pain and mechanical symptoms. Exam and history suggested medial meniscal tear confirmed by MRI. The patient presents now for arthroscopy and debridement   Procedure in detail -       After successful administration of General anesthetic, a tourmiquet is placed high on the Left  thigh and the Left lower extremity is prepped and draped in the usual sterile fashion. Time out is performed by the surgical team. Standard superomedial and inferolateral portal sites are marked and incisions made with an 11 blade. The inflow cannula is passed through the superomedial portal and camera through the inferolateral portal and inflow is initiated. Arthroscopic visualization proceeds.      The undersurface of the patella and trochlea are visualized and there is mild chondromalacia of the trochlea without any unstable defects. The medial and lateral gutters are visualized and there are  no loose bodies. Flexion and valgus force is applied to the knee and the medial compartment is entered. A spinal needle is passed into the joint through the site marked for the inferomedial portal. A small incision is made and the dilator passed into the joint. The findings for the medial compartment are unstable tear body and posterior horn medial meniscus and a 1 x 1 cm chondral defect medial femoral condyle . The tear is debrided to a stable base with baskets and a shaver and sealed off with the Arthrocare. The shaver is used to debride the unstable cartilage to a  stable cartilaginous base with stable edges. It is probed and found to be stable.    The intercondylar notch is visualized and the ACL appears normal. The lateral compartment is entered and the findings are normal .       The joint is again inspected and there are no other tears, defects or loose bodies identified. The arthroscopic equipment is then removed from the inferior portals which are closed with interrupted 4-0 nylon. 20 ml of .25% Marcaine with epinephrine are injected through the inflow cannula and the cannula is then removed and the portal closed with nylon. The incisions are cleaned and dried and a bulky sterile dressing is applied. The patient is then awakened and transported to recovery in stable condition.   04/28/2013, 4:48 PM

## 2013-04-28 NOTE — Anesthesia Preprocedure Evaluation (Addendum)
Anesthesia Evaluation  Patient identified by MRN, date of birth, ID band Patient awake    Reviewed: Allergy & Precautions, H&P , NPO status , Patient's Chart, lab work & pertinent test results  Airway Mallampati: II TM Distance: >3 FB Neck ROM: Full    Dental no notable dental hx.    Pulmonary sleep apnea and Continuous Positive Airway Pressure Ventilation , pneumonia -, former smoker,  breath sounds clear to auscultation  Pulmonary exam normal       Cardiovascular hypertension, negative cardio ROS  Rhythm:Regular Rate:Normal     Neuro/Psych  Headaches, PSYCHIATRIC DISORDERS  Neuromuscular disease    GI/Hepatic negative GI ROS, Neg liver ROS,   Endo/Other  negative endocrine ROS  Renal/GU negative Renal ROS  negative genitourinary   Musculoskeletal negative musculoskeletal ROS (+)   Abdominal (+) + obese,   Peds negative pediatric ROS (+)  Hematology negative hematology ROS (+)   Anesthesia Other Findings   Reproductive/Obstetrics negative OB ROS                          Anesthesia Physical Anesthesia Plan  ASA: II  Anesthesia Plan: General   Post-op Pain Management:    Induction: Intravenous  Airway Management Planned: LMA  Additional Equipment:   Intra-op Plan:   Post-operative Plan: Extubation in OR  Informed Consent: I have reviewed the patients History and Physical, chart, labs and discussed the procedure including the risks, benefits and alternatives for the proposed anesthesia with the patient or authorized representative who has indicated his/her understanding and acceptance.   Dental advisory given  Plan Discussed with: CRNA  Anesthesia Plan Comments:         Anesthesia Quick Evaluation

## 2013-04-28 NOTE — Anesthesia Postprocedure Evaluation (Signed)
  Anesthesia Post-op Note  Patient: Paul Moon  Procedure(s) Performed: Procedure(s) (LRB): LEFT ARTHROSCOPY KNEE WITH DEBRIDEMENT meniscal debridement (Left)  Patient Location: PACU  Anesthesia Type: General  Level of Consciousness: awake and alert   Airway and Oxygen Therapy: Patient Spontanous Breathing  Post-op Pain: mild  Post-op Assessment: Post-op Vital signs reviewed, Patient's Cardiovascular Status Stable, Respiratory Function Stable, Patent Airway and No signs of Nausea or vomiting  Last Vitals:  Filed Vitals:   04/28/13 1827  BP: 123/67  Pulse: 64  Temp: 36.2 C  Resp: 16    Post-op Vital Signs: stable   Complications: No apparent anesthesia complications

## 2013-04-28 NOTE — Discharge Instructions (Signed)

## 2013-04-28 NOTE — Interval H&P Note (Signed)
History and Physical Interval Note:  04/28/2013 3:51 PM  Paul Moon  has presented today for surgery, with the diagnosis of LEFT MEDIAL MENISCUS TEAR  The various methods of treatment have been discussed with the patient and family. After consideration of risks, benefits and other options for treatment, the patient has consented to  Procedure(s): LEFT ARTHROSCOPY KNEE WITH DEBRIDEMENT (Left) as a surgical intervention .  The patient's history has been reviewed, patient examined, no change in status, stable for surgery.  I have reviewed the patient's chart and labs.  Questions were answered to the patient's satisfaction.     Gearlean Alf

## 2013-04-28 NOTE — Transfer of Care (Signed)
Immediate Anesthesia Transfer of Care Note  Patient: Paul Moon  Procedure(s) Performed: Procedure(s): LEFT ARTHROSCOPY KNEE WITH DEBRIDEMENT meniscal debridement (Left)  Patient Location: PACU  Anesthesia Type:General  Level of Consciousness: oriented, sedated and patient cooperative  Airway & Oxygen Therapy: Patient Spontanous Breathing and Patient connected to face mask oxygen  Post-op Assessment: Report given to PACU RN, Post -op Vital signs reviewed and stable and Patient moving all extremities  Post vital signs: Reviewed and stable  Complications: No apparent anesthesia complications

## 2013-04-30 ENCOUNTER — Encounter (HOSPITAL_COMMUNITY): Payer: Self-pay | Admitting: Orthopedic Surgery

## 2013-05-21 ENCOUNTER — Encounter: Payer: Self-pay | Admitting: Internal Medicine

## 2013-05-21 ENCOUNTER — Ambulatory Visit (INDEPENDENT_AMBULATORY_CARE_PROVIDER_SITE_OTHER): Payer: Medicare Other | Admitting: Internal Medicine

## 2013-05-21 VITALS — BP 124/68 | HR 66 | Ht 68.0 in | Wt 228.8 lb

## 2013-05-21 DIAGNOSIS — G4733 Obstructive sleep apnea (adult) (pediatric): Secondary | ICD-10-CM

## 2013-05-21 NOTE — Patient Instructions (Signed)
Order- DME [facility] reduce CPAP to 12   DME  Consider trying otc Biotene treatment for dry mouth- ask at drug store  Please call as needed

## 2013-05-21 NOTE — Progress Notes (Signed)
Subjective:    Patient ID: Paul Moon, male    DOB: [birth year], 68 y.o.   MRN: [id number]  HPI 04/09/13- 32 yoM former smoker Referred by Dr Dustin Flock for sleep medicine evaluation. Epworth Score: 3/ 24 NPSG 03/12/13- Moderate obstructive sleep apnea, AHI 26.8/ hr, desat to 79%. CPAP titrated to 13/ AHI 0/ hr, weight 225 lbs. He gives a history of chronic loud snoring, witnessed apneas and daytime tiredness. Consent has fall asleep easily. Little caffeine. Bedtime between 10 and 12 PM, sleep latency 15 minutes, waking once or twice before up between 6 and 7 AM. ENT surgery limited to tonsils. Treated for hypertension. He denies heart lung or thyroid problems, or significant nasal congestion. Family history negative for sleep apnea.  05/21/13- 67 yoM former smoker followed for OSA, complicated by HTN, bipolar FOLLOWS FOR:  Pt states he is back to wearing his CPAP 13/ [facility]  every night(he had lost his cord after knee surgery) Ends up taking off during th enight as his mouth gets dry(feels like cotton). Recent arthroscopy without respiratory complication. Not snoring with CPAP. We are reducing pressure to 12 because of complaint of dry mouth  ROS-see HPI Constitutional:   No-   weight loss, night sweats, fevers, chills, fatigue, lassitude. HEENT:   No-  headaches, difficulty swallowing, tooth/dental problems, sore throat,       No-  sneezing, itching, ear ache, nasal congestion, post nasal drip,  CV:  No-   chest pain, orthopnea, PND, swelling in lower extremities, anasarca,                                  dizziness, palpitations Resp: No-   shortness of breath with exertion or at rest.              No-   productive cough,  No non-productive cough,  No- coughing up of blood.              No-   change in color of mucus.  No- wheezing.   Skin: No-   rash or lesions. GI:  No-   heartburn, indigestion, abdominal pain, nausea, vomiting,  GU:  MS:  No-   joint pain or swelling.   . Neuro-     nothing unusual Psych:  No- change in mood or affect. No depression or anxiety.  No memory loss.  Objective:   Physical Exam  OBJ- Physical Exam General- Alert, Oriented, Affect-appropriate/ reserved, Distress- none acute, overweight Skin- rash-none, lesions- none, excoriation- none Lymphadenopathy- none Head- atraumatic            Eyes- Gross vision intact, PERRLA, conjunctivae and secretions clear            Ears- Hearing, canals-normal            Nose- Clear, no-Septal dev, mucus, polyps, erosion, perforation             Throat- Mallampati III , mucosa clear- Not dry , drainage- none, tonsils- atrophic Neck- flexible , trachea midline, no stridor , thyroid nl, carotid no bruit Chest - symmetrical excursion , unlabored           Heart/CV- RRR , no murmur , no gallop  , no rub, nl s1 s2                           - JVD- none ,  edema- none, stasis changes- none, varices- none           Lung- + few crackles (recent bronchitis), wheeze- none, cough- none , dullness-none,                  rub- none           Chest wall-  Abd-  Br/ Gen/ Rectal- Not done, not indicated Extrem- cyanosis- none, clubbing, none, atrophy- none, strength- nl Neuro- grossly intact to observation    Assessment & Plan:

## 2013-05-25 ENCOUNTER — Ambulatory Visit: Payer: Self-pay | Admitting: Diagnostic Neuroimaging

## 2013-06-12 NOTE — Assessment & Plan Note (Signed)
Good compliance and control with CPAP 13/[facility]. Because he complains of dry mouth we will try to reduce flow little, down to 12. Also encouraged trial of Biotene.

## 2013-06-21 ENCOUNTER — Ambulatory Visit: Payer: Medicare Other | Admitting: Diagnostic Neuroimaging

## 2013-08-05 ENCOUNTER — Encounter: Payer: Self-pay | Admitting: Internal Medicine

## 2013-08-25 ENCOUNTER — Ambulatory Visit: Payer: Medicare Other | Admitting: Internal Medicine

## 2014-02-18 ENCOUNTER — Ambulatory Visit (INDEPENDENT_AMBULATORY_CARE_PROVIDER_SITE_OTHER): Payer: Medicare Other | Admitting: Neurology

## 2014-02-18 ENCOUNTER — Encounter: Payer: Self-pay | Admitting: Neurology

## 2014-02-18 VITALS — BP 142/83 | HR 80 | Ht 69.0 in | Wt 221.0 lb

## 2014-02-18 DIAGNOSIS — G5711 Meralgia paresthetica, right lower limb: Secondary | ICD-10-CM | POA: Insufficient documentation
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
## 2014-03-29 DIAGNOSIS — I10 Essential (primary) hypertension: Secondary | ICD-10-CM | POA: Diagnosis not present

## 2014-03-29 DIAGNOSIS — E559 Vitamin D deficiency, unspecified: Secondary | ICD-10-CM | POA: Diagnosis not present

## 2014-03-29 DIAGNOSIS — E538 Deficiency of other specified B group vitamins: Secondary | ICD-10-CM | POA: Diagnosis not present

## 2014-04-15 DIAGNOSIS — E559 Vitamin D deficiency, unspecified: Secondary | ICD-10-CM | POA: Diagnosis not present

## 2014-04-15 DIAGNOSIS — N182 Chronic kidney disease, stage 2 (mild): Secondary | ICD-10-CM | POA: Diagnosis not present

## 2014-04-15 DIAGNOSIS — I10 Essential (primary) hypertension: Secondary | ICD-10-CM | POA: Diagnosis not present

## 2014-04-15 DIAGNOSIS — G4733 Obstructive sleep apnea (adult) (pediatric): Secondary | ICD-10-CM | POA: Diagnosis not present

## 2014-07-04 DIAGNOSIS — J209 Acute bronchitis, unspecified: Secondary | ICD-10-CM | POA: Diagnosis not present

## 2014-07-04 DIAGNOSIS — R05 Cough: Secondary | ICD-10-CM | POA: Diagnosis not present

## 2014-07-14 DIAGNOSIS — D81818 Other biotin-dependent carboxylase deficiency: Secondary | ICD-10-CM | POA: Diagnosis not present

## 2014-07-14 DIAGNOSIS — E559 Vitamin D deficiency, unspecified: Secondary | ICD-10-CM | POA: Diagnosis not present

## 2014-07-14 DIAGNOSIS — I10 Essential (primary) hypertension: Secondary | ICD-10-CM | POA: Diagnosis not present

## 2014-07-14 DIAGNOSIS — Z23 Encounter for immunization: Secondary | ICD-10-CM | POA: Diagnosis not present

## 2014-07-14 DIAGNOSIS — Z1389 Encounter for screening for other disorder: Secondary | ICD-10-CM | POA: Diagnosis not present

## 2014-07-14 DIAGNOSIS — Z0000 Encounter for general adult medical examination without abnormal findings: Secondary | ICD-10-CM | POA: Diagnosis not present

## 2014-07-14 DIAGNOSIS — Z125 Encounter for screening for malignant neoplasm of prostate: Secondary | ICD-10-CM | POA: Diagnosis not present

## 2014-07-14 DIAGNOSIS — R739 Hyperglycemia, unspecified: Secondary | ICD-10-CM | POA: Diagnosis not present

## 2014-07-14 DIAGNOSIS — Z13820 Encounter for screening for osteoporosis: Secondary | ICD-10-CM | POA: Diagnosis not present

## 2014-07-21 DIAGNOSIS — L918 Other hypertrophic disorders of the skin: Secondary | ICD-10-CM | POA: Diagnosis not present

## 2014-07-21 DIAGNOSIS — R938 Abnormal findings on diagnostic imaging of other specified body structures: Secondary | ICD-10-CM | POA: Diagnosis not present

## 2014-07-21 DIAGNOSIS — E559 Vitamin D deficiency, unspecified: Secondary | ICD-10-CM | POA: Diagnosis not present

## 2014-07-21 DIAGNOSIS — G4733 Obstructive sleep apnea (adult) (pediatric): Secondary | ICD-10-CM | POA: Diagnosis not present

## 2014-07-21 DIAGNOSIS — N182 Chronic kidney disease, stage 2 (mild): Secondary | ICD-10-CM | POA: Diagnosis not present

## 2014-07-21 DIAGNOSIS — R7309 Other abnormal glucose: Secondary | ICD-10-CM | POA: Diagnosis not present

## 2014-07-21 DIAGNOSIS — J189 Pneumonia, unspecified organism: Secondary | ICD-10-CM | POA: Diagnosis not present

## 2014-07-21 DIAGNOSIS — I10 Essential (primary) hypertension: Secondary | ICD-10-CM | POA: Diagnosis not present

## 2014-08-25 DIAGNOSIS — H25813 Combined forms of age-related cataract, bilateral: Secondary | ICD-10-CM | POA: Diagnosis not present

## 2014-09-20 DIAGNOSIS — N182 Chronic kidney disease, stage 2 (mild): Secondary | ICD-10-CM | POA: Diagnosis not present

## 2014-09-20 DIAGNOSIS — F319 Bipolar disorder, unspecified: Secondary | ICD-10-CM | POA: Diagnosis not present

## 2014-09-20 DIAGNOSIS — I10 Essential (primary) hypertension: Secondary | ICD-10-CM | POA: Diagnosis not present

## 2015-01-16 DIAGNOSIS — E559 Vitamin D deficiency, unspecified: Secondary | ICD-10-CM | POA: Diagnosis not present

## 2015-01-16 DIAGNOSIS — E538 Deficiency of other specified B group vitamins: Secondary | ICD-10-CM | POA: Diagnosis not present

## 2015-01-16 DIAGNOSIS — R7309 Other abnormal glucose: Secondary | ICD-10-CM | POA: Diagnosis not present

## 2015-01-16 DIAGNOSIS — I10 Essential (primary) hypertension: Secondary | ICD-10-CM | POA: Diagnosis not present

## 2015-01-20 DIAGNOSIS — R7309 Other abnormal glucose: Secondary | ICD-10-CM | POA: Diagnosis not present

## 2015-01-20 DIAGNOSIS — F319 Bipolar disorder, unspecified: Secondary | ICD-10-CM | POA: Diagnosis not present

## 2015-01-20 DIAGNOSIS — N182 Chronic kidney disease, stage 2 (mild): Secondary | ICD-10-CM | POA: Diagnosis not present

## 2015-01-20 DIAGNOSIS — F17211 Nicotine dependence, cigarettes, in remission: Secondary | ICD-10-CM | POA: Diagnosis not present

## 2015-01-20 DIAGNOSIS — I129 Hypertensive chronic kidney disease with stage 1 through stage 4 chronic kidney disease, or unspecified chronic kidney disease: Secondary | ICD-10-CM | POA: Diagnosis not present

## 2015-02-23 DIAGNOSIS — I509 Heart failure, unspecified: Secondary | ICD-10-CM | POA: Diagnosis not present

## 2015-03-01 DIAGNOSIS — R600 Localized edema: Secondary | ICD-10-CM | POA: Diagnosis not present

## 2015-03-01 DIAGNOSIS — I509 Heart failure, unspecified: Secondary | ICD-10-CM | POA: Diagnosis not present

## 2015-03-23 DIAGNOSIS — J209 Acute bronchitis, unspecified: Secondary | ICD-10-CM | POA: Diagnosis not present

## 2015-04-28 DIAGNOSIS — R350 Frequency of micturition: Secondary | ICD-10-CM | POA: Diagnosis not present

## 2015-04-28 DIAGNOSIS — R7309 Other abnormal glucose: Secondary | ICD-10-CM | POA: Diagnosis not present

## 2015-06-06 DIAGNOSIS — R1030 Lower abdominal pain, unspecified: Secondary | ICD-10-CM | POA: Diagnosis not present

## 2015-06-13 DIAGNOSIS — N419 Inflammatory disease of prostate, unspecified: Secondary | ICD-10-CM | POA: Diagnosis not present

## 2015-06-13 DIAGNOSIS — N400 Enlarged prostate without lower urinary tract symptoms: Secondary | ICD-10-CM | POA: Diagnosis not present

## 2015-06-19 DIAGNOSIS — Z1212 Encounter for screening for malignant neoplasm of rectum: Secondary | ICD-10-CM | POA: Diagnosis not present

## 2015-07-11 DIAGNOSIS — R7309 Other abnormal glucose: Secondary | ICD-10-CM | POA: Diagnosis not present

## 2015-07-11 DIAGNOSIS — R1030 Lower abdominal pain, unspecified: Secondary | ICD-10-CM | POA: Diagnosis not present

## 2015-07-11 DIAGNOSIS — N419 Inflammatory disease of prostate, unspecified: Secondary | ICD-10-CM | POA: Diagnosis not present

## 2015-07-21 DIAGNOSIS — Z125 Encounter for screening for malignant neoplasm of prostate: Secondary | ICD-10-CM | POA: Diagnosis not present

## 2015-07-21 DIAGNOSIS — R7309 Other abnormal glucose: Secondary | ICD-10-CM | POA: Diagnosis not present

## 2015-07-21 DIAGNOSIS — E538 Deficiency of other specified B group vitamins: Secondary | ICD-10-CM | POA: Diagnosis not present

## 2015-07-21 DIAGNOSIS — E559 Vitamin D deficiency, unspecified: Secondary | ICD-10-CM | POA: Diagnosis not present

## 2015-07-21 DIAGNOSIS — I129 Hypertensive chronic kidney disease with stage 1 through stage 4 chronic kidney disease, or unspecified chronic kidney disease: Secondary | ICD-10-CM | POA: Diagnosis not present

## 2015-07-25 DIAGNOSIS — Z0000 Encounter for general adult medical examination without abnormal findings: Secondary | ICD-10-CM | POA: Diagnosis not present

## 2015-07-25 DIAGNOSIS — Z1389 Encounter for screening for other disorder: Secondary | ICD-10-CM | POA: Diagnosis not present

## 2015-08-01 DIAGNOSIS — D709 Neutropenia, unspecified: Secondary | ICD-10-CM | POA: Diagnosis not present

## 2015-08-01 DIAGNOSIS — I5032 Chronic diastolic (congestive) heart failure: Secondary | ICD-10-CM | POA: Diagnosis not present

## 2015-08-01 DIAGNOSIS — I129 Hypertensive chronic kidney disease with stage 1 through stage 4 chronic kidney disease, or unspecified chronic kidney disease: Secondary | ICD-10-CM | POA: Diagnosis not present

## 2015-08-14 DIAGNOSIS — B029 Zoster without complications: Secondary | ICD-10-CM | POA: Diagnosis not present

## 2015-08-21 DIAGNOSIS — B029 Zoster without complications: Secondary | ICD-10-CM | POA: Diagnosis not present

## 2015-08-28 DIAGNOSIS — R9431 Abnormal electrocardiogram [ECG] [EKG]: Secondary | ICD-10-CM | POA: Diagnosis not present

## 2015-08-30 DIAGNOSIS — H04123 Dry eye syndrome of bilateral lacrimal glands: Secondary | ICD-10-CM | POA: Diagnosis not present

## 2015-08-30 DIAGNOSIS — E119 Type 2 diabetes mellitus without complications: Secondary | ICD-10-CM | POA: Diagnosis not present

## 2015-08-30 DIAGNOSIS — H2513 Age-related nuclear cataract, bilateral: Secondary | ICD-10-CM | POA: Diagnosis not present

## 2015-09-26 DIAGNOSIS — Z8619 Personal history of other infectious and parasitic diseases: Secondary | ICD-10-CM | POA: Diagnosis not present

## 2015-09-26 DIAGNOSIS — N400 Enlarged prostate without lower urinary tract symptoms: Secondary | ICD-10-CM | POA: Diagnosis not present

## 2015-09-26 DIAGNOSIS — Z7689 Persons encountering health services in other specified circumstances: Secondary | ICD-10-CM | POA: Diagnosis not present

## 2015-09-26 DIAGNOSIS — Z125 Encounter for screening for malignant neoplasm of prostate: Secondary | ICD-10-CM | POA: Diagnosis not present

## 2015-10-26 DIAGNOSIS — R350 Frequency of micturition: Secondary | ICD-10-CM | POA: Diagnosis not present

## 2015-10-26 DIAGNOSIS — N401 Enlarged prostate with lower urinary tract symptoms: Secondary | ICD-10-CM | POA: Diagnosis not present

## 2015-10-26 DIAGNOSIS — R972 Elevated prostate specific antigen [PSA]: Secondary | ICD-10-CM | POA: Diagnosis not present

## 2015-12-15 DIAGNOSIS — R972 Elevated prostate specific antigen [PSA]: Secondary | ICD-10-CM | POA: Diagnosis not present

## 2015-12-15 DIAGNOSIS — C61 Malignant neoplasm of prostate: Secondary | ICD-10-CM | POA: Diagnosis not present

## 2015-12-22 DIAGNOSIS — C61 Malignant neoplasm of prostate: Secondary | ICD-10-CM | POA: Diagnosis not present

## 2016-01-18 DIAGNOSIS — R102 Pelvic and perineal pain: Secondary | ICD-10-CM | POA: Diagnosis not present

## 2016-01-22 DIAGNOSIS — R102 Pelvic and perineal pain: Secondary | ICD-10-CM | POA: Diagnosis not present

## 2016-01-22 DIAGNOSIS — C61 Malignant neoplasm of prostate: Secondary | ICD-10-CM | POA: Diagnosis not present

## 2016-01-29 DIAGNOSIS — E559 Vitamin D deficiency, unspecified: Secondary | ICD-10-CM | POA: Diagnosis not present

## 2016-01-29 DIAGNOSIS — R7309 Other abnormal glucose: Secondary | ICD-10-CM | POA: Diagnosis not present

## 2016-01-29 DIAGNOSIS — I129 Hypertensive chronic kidney disease with stage 1 through stage 4 chronic kidney disease, or unspecified chronic kidney disease: Secondary | ICD-10-CM | POA: Diagnosis not present

## 2016-01-29 DIAGNOSIS — N400 Enlarged prostate without lower urinary tract symptoms: Secondary | ICD-10-CM | POA: Diagnosis not present

## 2016-01-29 DIAGNOSIS — E538 Deficiency of other specified B group vitamins: Secondary | ICD-10-CM | POA: Diagnosis not present

## 2016-01-31 DIAGNOSIS — Z87891 Personal history of nicotine dependence: Secondary | ICD-10-CM | POA: Diagnosis not present

## 2016-01-31 DIAGNOSIS — R51 Headache: Secondary | ICD-10-CM | POA: Diagnosis not present

## 2016-01-31 DIAGNOSIS — S199XXA Unspecified injury of neck, initial encounter: Secondary | ICD-10-CM | POA: Diagnosis not present

## 2016-01-31 DIAGNOSIS — I10 Essential (primary) hypertension: Secondary | ICD-10-CM | POA: Diagnosis not present

## 2016-01-31 DIAGNOSIS — M542 Cervicalgia: Secondary | ICD-10-CM | POA: Diagnosis not present

## 2016-01-31 DIAGNOSIS — S139XXA Sprain of joints and ligaments of unspecified parts of neck, initial encounter: Secondary | ICD-10-CM | POA: Diagnosis not present

## 2016-01-31 DIAGNOSIS — I739 Peripheral vascular disease, unspecified: Secondary | ICD-10-CM | POA: Diagnosis not present

## 2016-01-31 DIAGNOSIS — R202 Paresthesia of skin: Secondary | ICD-10-CM | POA: Diagnosis not present

## 2016-01-31 DIAGNOSIS — S0990XA Unspecified injury of head, initial encounter: Secondary | ICD-10-CM | POA: Diagnosis not present

## 2016-01-31 DIAGNOSIS — M5136 Other intervertebral disc degeneration, lumbar region: Secondary | ICD-10-CM | POA: Diagnosis not present

## 2016-01-31 DIAGNOSIS — M5412 Radiculopathy, cervical region: Secondary | ICD-10-CM | POA: Diagnosis not present

## 2016-01-31 DIAGNOSIS — G319 Degenerative disease of nervous system, unspecified: Secondary | ICD-10-CM | POA: Diagnosis not present

## 2016-02-01 DIAGNOSIS — I10 Essential (primary) hypertension: Secondary | ICD-10-CM | POA: Diagnosis not present

## 2016-02-01 DIAGNOSIS — Z23 Encounter for immunization: Secondary | ICD-10-CM | POA: Diagnosis not present

## 2016-02-01 DIAGNOSIS — I5030 Unspecified diastolic (congestive) heart failure: Secondary | ICD-10-CM | POA: Diagnosis not present

## 2016-02-01 DIAGNOSIS — E119 Type 2 diabetes mellitus without complications: Secondary | ICD-10-CM | POA: Diagnosis not present

## 2016-03-04 DIAGNOSIS — Z8739 Personal history of other diseases of the musculoskeletal system and connective tissue: Secondary | ICD-10-CM

## 2016-03-04 HISTORY — DX: Personal history of other diseases of the musculoskeletal system and connective tissue: Z87.39

## 2016-04-04 DIAGNOSIS — C61 Malignant neoplasm of prostate: Secondary | ICD-10-CM | POA: Diagnosis not present

## 2016-05-21 DIAGNOSIS — M25531 Pain in right wrist: Secondary | ICD-10-CM | POA: Diagnosis not present

## 2016-05-21 DIAGNOSIS — M79645 Pain in left finger(s): Secondary | ICD-10-CM | POA: Diagnosis not present

## 2016-05-24 DIAGNOSIS — D075 Carcinoma in situ of prostate: Secondary | ICD-10-CM | POA: Diagnosis not present

## 2016-05-24 DIAGNOSIS — R42 Dizziness and giddiness: Secondary | ICD-10-CM | POA: Diagnosis not present

## 2016-05-24 DIAGNOSIS — C61 Malignant neoplasm of prostate: Secondary | ICD-10-CM | POA: Diagnosis not present

## 2016-05-30 DIAGNOSIS — C61 Malignant neoplasm of prostate: Secondary | ICD-10-CM | POA: Diagnosis not present

## 2016-06-18 ENCOUNTER — Encounter: Payer: Self-pay | Admitting: Radiation Oncology

## 2016-06-18 DIAGNOSIS — C61 Malignant neoplasm of prostate: Secondary | ICD-10-CM | POA: Insufficient documentation

## 2016-06-18 NOTE — Progress Notes (Addendum)
GU Location of Tumor / Histology: prostatic adenocarcinoma  If Prostate Cancer, Gleason Score is (3 + 3) and PSA is (5.0). Prostate volume:   [name] was evaluated by Delmar Landau, NP at his PCP's office following an episode of urgency. Reports he was treated for proctitis three times before being referred to Dr. Alyson Ingles at [facility] in September 2017.   Biopsies of prostate (if applicable) revealed:    Past/Anticipated interventions by urology, if any: biopsy, referral to Dr. Tammi Klippel  Past/Anticipated interventions by medical oncology, if any: no  Weight changes, if any: no  Bowel/Bladder complaints, if any: Difficulty emptying his bladder, urinary frequency, intermittent stream, urgency, weak stream, hesitancy, nocturia x 2. Denies dysuria, hematuria, leakage, or incontinence.  Nausea/Vomiting, if any: no  Pain issues, if any:  Heaviness or pressure in low abdomen  SAFETY ISSUES:  Prior radiation? no  Pacemaker/ICD? no  Possible current pregnancy? no  Is the patient on methotrexate? no  Current Complaints / other details:  71 year old male. Divorced. One son and one daughter. Retired/some real estate.  Scheduled for a consultation at [facility] for arthritis in fingers.

## 2016-06-19 ENCOUNTER — Ambulatory Visit
Admission: RE | Admit: 2016-06-19 | Discharge: 2016-06-19 | Disposition: A | Discharge status: Home / Self Care | Payer: Medicare Other | Source: Ambulatory Visit | Attending: Radiation Oncology | Admitting: Radiation Oncology

## 2016-06-19 ENCOUNTER — Encounter: Payer: Self-pay | Admitting: Radiation Oncology

## 2016-06-19 ENCOUNTER — Encounter: Payer: Self-pay | Admitting: Medical Oncology

## 2016-06-19 DIAGNOSIS — Z9889 Other specified postprocedural states: Secondary | ICD-10-CM | POA: Diagnosis not present

## 2016-06-19 DIAGNOSIS — Z7982 Long term (current) use of aspirin: Secondary | ICD-10-CM | POA: Insufficient documentation

## 2016-06-19 DIAGNOSIS — C61 Malignant neoplasm of prostate: Secondary | ICD-10-CM

## 2016-06-19 DIAGNOSIS — Z79899 Other long term (current) drug therapy: Secondary | ICD-10-CM | POA: Insufficient documentation

## 2016-06-19 DIAGNOSIS — I10 Essential (primary) hypertension: Secondary | ICD-10-CM | POA: Diagnosis not present

## 2016-06-19 DIAGNOSIS — Z8249 Family history of ischemic heart disease and other diseases of the circulatory system: Secondary | ICD-10-CM | POA: Diagnosis not present

## 2016-06-19 DIAGNOSIS — Z808 Family history of malignant neoplasm of other organs or systems: Secondary | ICD-10-CM | POA: Diagnosis not present

## 2016-06-19 DIAGNOSIS — Z87891 Personal history of nicotine dependence: Secondary | ICD-10-CM | POA: Insufficient documentation

## 2016-06-19 DIAGNOSIS — R972 Elevated prostate specific antigen [PSA]: Secondary | ICD-10-CM | POA: Diagnosis not present

## 2016-06-19 HISTORY — DX: Malignant neoplasm of prostate: C61

## 2016-06-19 NOTE — Progress Notes (Signed)
Radiation Oncology         [phone] ________________________________  Initial Outpatient Consultation  Name: Paul Moon MRN: [id number]  Date: 06/19/2016  DOB: 1945/06/21  RX:[id number], MD  Thressa Sheller, MD   REFERRING PHYSICIAN: Thressa Sheller, MD  DIAGNOSIS: 71 y.o. gentleman with Stage T1c adenocarcinoma of the prostate with Gleason Score of 3+4, and PSA of 13.10    ICD-9-CM ICD-10-CM   1. Prostate cancer ([place]) [facility] referral to Social Work   HISTORY OF PRESENT ILLNESS: Paul Moon is a 71 y.o. male with a diagnosis of prostate cancer. He was noted to have an elevated PSA of 5.0 by his primary care physician, Dr. Thressa Sheller.  Accordingly, he was referred for evaluation in urology by Dr. [name],  digital rectal examination was performed at that time and per patient, revealed no nodules.  He had a TRUSPBx on 12/15/15 which revealed Gleason 3+3 in 2/12 cores.  He opted to proceed with active surveillance.  Repeat PSA on 04/04/16 was 13.10. The patient proceeded to repeat transrectal ultrasound with 12 biopsies of the prostate on 05/24/16.  The prostate volume measured 65.5 cc.  Out of 12 core biopsies, 4 were positive.  The maximum Gleason score was 3+3, and this was seen in the left base, left mid gland, right apex and right apex lateral.  Of note regarding the biopsies obtained on 05/24/16: The prostate diagram showed Gleason score 3+3=6 of the left base, but the diagnosis section mentioned 3+4=7 of the left base.  CT A/P on 01/22/16 revealed grossly normal prostate and seminal vesicles and small, sub-centimeter iliac lymph nodes.  PSA 06/2015: 4.8 07/2015: 5.0 09/2015: 4.9 04/2016: 13.1  The patient reviewed the biopsy results with his urologist and he has kindly been referred today for discussion of potential radiation treatment options.   PREVIOUS RADIATION THERAPY: No  PAST MEDICAL HISTORY:  Past Medical History:  Diagnosis  Date  . Arthritis    INDEX FINGER JOINT LEFT HAND  . Enlarged prostate   . H/O bronchitis JAN 2015   PT FEELS MUCH BETTER - STILL HAS SOME COUGH - BUT NO CHEST CONGESTION  . Hypertension   . Mental disorder    BIPOLAR  . Pinched cervical nerve root    PT STATES C 7-8 PINCHED NERVE CAUSING NUMBNESS MIDDLE, RING AND LITTLE FINGER LEFT HAND - NO PAIN - STATES HE IS TRYING TO AVOID SURGERY.  . Pneumonia    2 YRS AGO  . Prostate cancer ([place])   . Sleep apnea    USES CPAP  SETTING 4-13      PAST SURGICAL HISTORY: Past Surgical History:  Procedure Laterality Date  . CIRCUMCISION    . KNEE ARTHROSCOPY Left 04/28/2013   Procedure: LEFT ARTHROSCOPY KNEE WITH DEBRIDEMENT meniscal debridement;  Surgeon: Gearlean Alf, MD;  Location: [facility];  Service: Orthopedics;  Laterality: Left;  . KNEE SURGERY  2008   right  . PROSTATE BIOPSY    . TONSILLECTOMY      FAMILY HISTORY:  Family History  Problem Relation Age of Onset  . Renal cancer Father   . Heart attack Paternal Grandmother   . Heart attack Paternal Grandfather     SOCIAL HISTORY:  Social History   Social History  . Marital status: Divorced    Spouse name: N/A  . Number of children: 2  . Years of education: College   Occupational History  . Semi-Retired    Social History Main Topics  .  Smoking status: Former Smoker    Packs/day: 1.50    Years: 20.00    Types: Cigarettes    Quit date: 05/26/1985  . Smokeless tobacco: Never Used  . Alcohol use Yes     Comment: wine from time to time  . Drug use: No  . Sexual activity: Yes   Other Topics Concern  . Not on file   Social History Narrative   Patient lives at home alone.   Caffeine Use: occasionally    ALLERGIES: Hctz [hydrochlorothiazide]; Lactose intolerance (gi); Lisinopril; Risperdal [risperidone]; Seroquel [quetiapine fumarate]; and Verapamil  MEDICATIONS:  Current Outpatient Prescriptions  Medication Sig Dispense Refill  . aspirin EC 81 MG tablet Take 81  mg by mouth daily.    . cholecalciferol (VITAMIN D) 1000 UNITS tablet Take 1,000 Units by mouth daily.    . finasteride (PROSCAR) 5 MG tablet Take 5 mg by mouth.    Marland Kitchen GARLIC PO Take by mouth.    . losartan (COZAAR) 100 MG tablet Take 100 mg by mouth at bedtime.     . Multiple Vitamin (MULTIVITAMIN WITH MINERALS) TABS tablet Take 1 tablet by mouth daily.    . Nutritional Supplements (JUICE PLUS FIBRE PO) Take by mouth.    . Omega-3 Fatty Acids (FISH OIL) 1200 MG CAPS Take 1,200 mg by mouth daily.    . Cyanocobalamin (HM SUPER VITAMIN B12) 2500 MCG CHEW Chew 2,500 mcg by mouth daily.    . tadalafil (CIALIS) 5 MG tablet Take 5 mg by mouth daily as needed for erectile dysfunction.      No current facility-administered medications for this encounter.     REVIEW OF SYSTEMS:  On review of systems, the patient reports that he is doing well overall. He denies any chest pain, shortness of breath, cough, fevers, chills, night sweats, unintended weight changes. He denies any bowel disturbances, and denies abdominal pain, nausea or vomiting. He denies any new musculoskeletal or joint aches or pains. His IPSS was 3, indicating mild urinary symptoms. Urinary symptoms include difficulty emptying his bladder, urinary frequency, intermittent stream, urgency, weak stream, hesitancy, and nocturia x2. He denies dysuria, hematuria, leakage, or incontinence. His SHIM was 20; mild ED. He uses Cialis for ED and is able to complete sexual activity with almost all attempts. A complete review of systems is obtained and is otherwise negative.  PHYSICAL EXAM:  Wt Readings from Last 3 Encounters:  06/19/16 219 lb 9.6 oz (99.6 kg)  02/18/14 221 lb (100.2 kg)  05/21/13 228 lb 12.8 oz (103.8 kg)   Temp Readings from Last 3 Encounters:  04/28/13 97.2 F (36.2 C) (Oral)  04/26/13 97.6 F (36.4 C) (Oral)  03/22/13 97.7 F (36.5 C) (Oral)   BP Readings from Last 3 Encounters:  06/19/16 (!) 145/76  02/18/14 (!) 142/83    05/21/13 124/68   Pulse Readings from Last 3 Encounters:  06/19/16 74  02/18/14 80  05/21/13 66   Pain Assessment Pain Score: 0-No pain/10  In general this is a well appearing African-American male in no acute distress. He is alert and oriented x4 and appropriate throughout the examination. HEENT reveals that the patient is normocephalic, atraumatic. EOMs are intact. PERRLA. Skin is intact without any evidence of gross lesions. Cardiovascular exam reveals a regular rate and rhythm, no clicks rubs or murmurs are auscultated. Chest is clear to auscultation bilaterally. Lymphatic assessment is performed and does not reveal any adenopathy in the cervical, supraclavicular, axillary, or inguinal chains. Abdomen has active bowel  sounds in all quadrants and is intact. The abdomen is soft, non tender, non distended. Lower extremities are negative for pretibial pitting edema, deep calf tenderness, cyanosis or clubbing.  KPS = 100  100 - Normal; no complaints; no evidence of disease. 90   - Able to carry on normal activity; minor signs or symptoms of disease. 80   - Normal activity with effort; some signs or symptoms of disease. 42   - Cares for self; unable to carry on normal activity or to do active work. 60   - Requires occasional assistance, but is able to care for most of his personal needs. 50   - Requires considerable assistance and frequent medical care. 26   - Disabled; requires special care and assistance. 36   - Severely disabled; hospital admission is indicated although death not imminent. 46   - Very sick; hospital admission necessary; active supportive treatment necessary. 10   - Moribund; fatal processes progressing rapidly. 0     - Dead  Karnofsky DA, Abelmann T[place], Craver LS and Burchenal [place] [phone]) The use of the nitrogen mustards in the palliative treatment of carcinoma: with particular reference to bronchogenic carcinoma Cancer 1 634-56  LABORATORY DATA:  Lab Results  Component  Value Date   WBC 4.4 04/26/2013   HGB 13.1 04/26/2013   HCT 40.2 04/26/2013   MCV 93.7 04/26/2013   PLT 273 04/26/2013   Lab Results  Component Value Date   NA 140 04/26/2013   K 4.4 04/26/2013   CL 100 04/26/2013   CO2 30 04/26/2013   No results found for: ALT, AST, GGT, ALKPHOS, BILITOT   RADIOGRAPHY: No results found.    IMPRESSION/PLAN: 1. 71 y.o. gentleman with Stage T1c adenocarcinoma of the prostate with Gleason Score of 3+4, and PSA of 13.10  We discussed the patient's workup and outlined the nature of prostate cancer in this setting. The patient's T stage, Gleason's score, and PSA put him into the intermediate risk group. Accordingly he is eligible for a variety of potential treatment options including external radiation, or brachytherapy. We discussed the available radiation techniques, and focused on the details of logistics and delivery. The patient may not be an ideal candidate for brachytherapy with a prostate volume of 65.5 cc, however, his urinary symptoms are minimal, suggesting that implant may be well tolerated. We discussed and outlined the risks, benefits, short and long-term effects associated with radiotherapy and compared and contrasted these with prostatectomy.   At the end of the conversation the patient is very interested in moving forward with brachytherapy, but, he has not finalized his decision. We will share our discussion with Dr. Alyson Ingles and await the patient decision.  In the interim, pathology will be contacted regarding the left base biopsy diagnosis question from 05/24/16.   We spent 60 minutes face to face with the patient and more than 50% of that time was spent in counseling and/or coordination of care.     Nicholos Johns, PA-C    Tyler Pita, MD  [facility] Direct Dial: [phone]  Fax: [phone] .com  Skype  LinkedIn   This document serves as a record of services personally performed by  Freeman Caldron, PA-C and Tyler Pita, MD. It was created on their behalf by Darcus Austin, a trained medical scribe. The creation of this record is based on the scribe's personal observations and the providers' statements to them. This document has been checked and approved by the attending provider.

## 2016-06-19 NOTE — Progress Notes (Signed)
See progress note under physician encounter. 

## 2016-06-25 DIAGNOSIS — M19031 Primary osteoarthritis, right wrist: Secondary | ICD-10-CM | POA: Diagnosis not present

## 2016-06-25 DIAGNOSIS — M19041 Primary osteoarthritis, right hand: Secondary | ICD-10-CM | POA: Diagnosis not present

## 2016-06-25 DIAGNOSIS — M19039 Primary osteoarthritis, unspecified wrist: Secondary | ICD-10-CM | POA: Diagnosis not present

## 2016-06-25 DIAGNOSIS — M19042 Primary osteoarthritis, left hand: Secondary | ICD-10-CM | POA: Diagnosis not present

## 2016-06-27 ENCOUNTER — Encounter: Payer: Self-pay | Admitting: *Deleted

## 2016-06-27 NOTE — Progress Notes (Signed)
[facility] Psychosocial Distress Screening Clinical Social Work  Clinical Social Work was referred by distress screening protocol.  The patient scored a 5 on the Psychosocial Distress Thermometer which indicates moderate distress. Clinical Social Worker contacted patient to assess for distress and other psychosocial needs.  Patient shared he was coping well with recent diagnosis of prostate cancer.  He reported he has been very pleased with the support and information provided by the cancer care team.  He requested a referral to [facility] for a second opinion before making a treatment decision.  CSW explained CSW role, but stated would relay that request to radiation oncology.   CSW briefly shared information on supportive services and encouraged patient to call as needed.  ONCBCN DISTRESS SCREENING 06/19/2016  Screening Type Initial Screening  Distress experienced in past week (1-10) 5  Practical problem type Work/school  Emotional problem type Nervousness/Anxiety  Spiritual/Religous concerns type Facing my mortality  Information Concerns Type Lack of info about treatment;Lack of info about complementary therapy choices  Physician notified of physical symptoms Yes  Referral to clinical psychology No  Referral to clinical social work Yes  Referral to dietition No  Referral to financial advocate No  Referral to support programs No  Referral to palliative care No    [place], MSW, LCSW, OSW-C Clinical Social Worker [place] [phone]

## 2016-06-28 ENCOUNTER — Encounter: Payer: Self-pay | Admitting: Urology

## 2016-06-28 ENCOUNTER — Other Ambulatory Visit: Payer: Self-pay | Admitting: Urology

## 2016-06-28 DIAGNOSIS — C61 Malignant neoplasm of prostate: Secondary | ICD-10-CM | POA: Diagnosis not present

## 2016-06-28 NOTE — Progress Notes (Signed)
Patient requests a referral to [facility] for second opinion.  Referral placed.

## 2016-07-02 ENCOUNTER — Telehealth: Payer: Self-pay | Admitting: Medical Oncology

## 2016-07-02 NOTE — Progress Notes (Signed)
Spoke with Paul Moon to see if he has gotten an appointment for second opinion regarding his prostate cancer. He states that he has not heard anything as of today. I will follow up and call him back.

## 2016-07-02 NOTE — Progress Notes (Signed)
I called Paul Moon to inform him that I did speak with scheduling at [facility] and they are working on the appointment for second opinion. He voiced understanding and thanked me for following up.

## 2016-07-02 NOTE — Progress Notes (Signed)
Called [facility] regarding second opinion for prostate cancer. I was informed they are working on this appointment and will contact patient as soon as possible.

## 2016-07-05 DIAGNOSIS — M19031 Primary osteoarthritis, right wrist: Secondary | ICD-10-CM | POA: Diagnosis not present

## 2016-07-09 DIAGNOSIS — M19032 Primary osteoarthritis, left wrist: Secondary | ICD-10-CM | POA: Diagnosis not present

## 2016-07-09 DIAGNOSIS — M19031 Primary osteoarthritis, right wrist: Secondary | ICD-10-CM | POA: Diagnosis not present

## 2016-07-11 ENCOUNTER — Telehealth: Payer: Self-pay | Admitting: Urology

## 2016-07-11 NOTE — Telephone Encounter (Signed)
I returned the patient's call and he is inquiring about his referral for 2nd opinion at [facility].  I advised that I will follow up with Cira Rue, Nurse navigator to see where we stand with this referral.  I will request that Robin follow up with patient directly regarding an update on status.

## 2016-07-12 ENCOUNTER — Telehealth: Payer: Self-pay | Admitting: Medical Oncology

## 2016-07-12 ENCOUNTER — Encounter: Payer: Self-pay | Admitting: Urology

## 2016-07-12 NOTE — Progress Notes (Signed)
Gave Mr. Greenberger appointment with Dr. Truman Hayward at [facility] 08/07/16 at 1:00 pm. He voiced understanding.

## 2016-07-12 NOTE — Progress Notes (Signed)
Patient has an appointment for second opinion at [facility]. Onc) for 08/07/16.  Patient was notified by Cira Rue, prostate nurse navigator.  I will plan to follow up with the patient shortly after this appointment to inquire about his final decision regarding treatment preference.

## 2016-07-12 NOTE — Progress Notes (Signed)
I called [facility] regarding a second opinion appointment for Mr. Nazzaro. I spoke with them on 07/02/16 and they were going to contact patient. As of today he has not been contacted. I spoke with Judeen Hammans at [facility] and received appointment for June 6 at 1:00 with Dr. Truman Hayward

## 2016-07-30 DIAGNOSIS — I129 Hypertensive chronic kidney disease with stage 1 through stage 4 chronic kidney disease, or unspecified chronic kidney disease: Secondary | ICD-10-CM | POA: Diagnosis not present

## 2016-07-30 DIAGNOSIS — Z0000 Encounter for general adult medical examination without abnormal findings: Secondary | ICD-10-CM | POA: Diagnosis not present

## 2016-07-30 DIAGNOSIS — E538 Deficiency of other specified B group vitamins: Secondary | ICD-10-CM | POA: Diagnosis not present

## 2016-07-30 DIAGNOSIS — E559 Vitamin D deficiency, unspecified: Secondary | ICD-10-CM | POA: Diagnosis not present

## 2016-08-05 DIAGNOSIS — Z0000 Encounter for general adult medical examination without abnormal findings: Secondary | ICD-10-CM | POA: Diagnosis not present

## 2016-08-05 DIAGNOSIS — D709 Neutropenia, unspecified: Secondary | ICD-10-CM | POA: Diagnosis not present

## 2016-08-05 DIAGNOSIS — I5032 Chronic diastolic (congestive) heart failure: Secondary | ICD-10-CM | POA: Diagnosis not present

## 2016-08-05 DIAGNOSIS — Z125 Encounter for screening for malignant neoplasm of prostate: Secondary | ICD-10-CM | POA: Diagnosis not present

## 2016-08-07 DIAGNOSIS — C61 Malignant neoplasm of prostate: Secondary | ICD-10-CM | POA: Diagnosis not present

## 2016-08-12 DIAGNOSIS — C61 Malignant neoplasm of prostate: Secondary | ICD-10-CM | POA: Diagnosis not present

## 2016-08-12 DIAGNOSIS — R972 Elevated prostate specific antigen [PSA]: Secondary | ICD-10-CM | POA: Diagnosis not present

## 2016-08-12 DIAGNOSIS — N401 Enlarged prostate with lower urinary tract symptoms: Secondary | ICD-10-CM | POA: Diagnosis not present

## 2016-08-20 ENCOUNTER — Other Ambulatory Visit: Payer: Self-pay | Admitting: Urology

## 2016-08-20 DIAGNOSIS — C61 Malignant neoplasm of prostate: Secondary | ICD-10-CM

## 2016-09-03 ENCOUNTER — Ambulatory Visit
Admission: RE | Admit: 2016-09-03 | Discharge: 2016-09-03 | Disposition: A | Discharge status: Home / Self Care | Payer: Medicare Other | Source: Ambulatory Visit | Attending: Urology | Admitting: Urology

## 2016-09-03 ENCOUNTER — Other Ambulatory Visit: Payer: Self-pay | Admitting: Urology

## 2016-09-03 DIAGNOSIS — C61 Malignant neoplasm of prostate: Secondary | ICD-10-CM

## 2016-09-03 MED ORDER — GADOBENATE DIMEGLUMINE 529 MG/ML IV SOLN
20.0000 mL | Freq: Once | INTRAVENOUS | Status: AC | PRN
  Administered 2016-09-03: 20 mL via INTRAVENOUS

## 2016-09-07 ENCOUNTER — Ambulatory Visit
Admission: RE | Admit: 2016-09-07 | Discharge: 2016-09-07 | Disposition: A | Discharge status: Home / Self Care | Payer: Medicare Other | Source: Ambulatory Visit | Attending: Urology | Admitting: Urology

## 2016-09-07 DIAGNOSIS — C61 Malignant neoplasm of prostate: Secondary | ICD-10-CM

## 2016-09-10 ENCOUNTER — Telehealth: Payer: Self-pay | Admitting: Medical Oncology

## 2016-09-10 NOTE — Telephone Encounter (Signed)
Spoke with Paul Moon to follow up with him regarding treatment plans. He saw Dr. Truman Hayward in June for second opinion at [facility]. Dr. Truman Hayward has recommended to continue active surveillance after reviewing his records and receiving his latest PSA. He states that he had a PSA drawn prior to his consult but results were not available that day. He states PSA was 5, decreasing from 13 that was drawn in February. He questioned if a mistake was made and I discussed things that could cause an elevation in the PSA.He also informed me that he has also sought another opinion and had a MRI of the prostate 09/07/16 but has not been contacted with results. I asked if he going to continue his care with Dr. Alyson Ingles at [facility] or with another physician. He states he is going to continue care with Dr. Alyson Ingles. I stressed to him the importance of being followed with monitoring of his PSA.  He voiced understanding and asked him to call me with questions or concerns.

## 2016-09-27 DIAGNOSIS — R609 Edema, unspecified: Secondary | ICD-10-CM | POA: Diagnosis not present

## 2016-09-27 DIAGNOSIS — M109 Gout, unspecified: Secondary | ICD-10-CM | POA: Diagnosis not present

## 2016-09-27 DIAGNOSIS — R5383 Other fatigue: Secondary | ICD-10-CM | POA: Diagnosis not present

## 2016-09-27 DIAGNOSIS — M1990 Unspecified osteoarthritis, unspecified site: Secondary | ICD-10-CM | POA: Diagnosis not present

## 2016-10-01 DIAGNOSIS — M19032 Primary osteoarthritis, left wrist: Secondary | ICD-10-CM | POA: Diagnosis not present

## 2016-10-01 DIAGNOSIS — M19031 Primary osteoarthritis, right wrist: Secondary | ICD-10-CM | POA: Diagnosis not present

## 2016-10-08 DIAGNOSIS — C61 Malignant neoplasm of prostate: Secondary | ICD-10-CM | POA: Diagnosis not present

## 2016-10-14 DIAGNOSIS — C61 Malignant neoplasm of prostate: Secondary | ICD-10-CM | POA: Diagnosis not present

## 2016-11-20 DIAGNOSIS — Z79899 Other long term (current) drug therapy: Secondary | ICD-10-CM | POA: Diagnosis not present

## 2016-12-04 DIAGNOSIS — R7303 Prediabetes: Secondary | ICD-10-CM | POA: Diagnosis not present

## 2016-12-04 DIAGNOSIS — I10 Essential (primary) hypertension: Secondary | ICD-10-CM | POA: Diagnosis not present

## 2016-12-04 DIAGNOSIS — Z23 Encounter for immunization: Secondary | ICD-10-CM | POA: Diagnosis not present

## 2016-12-10 DIAGNOSIS — C61 Malignant neoplasm of prostate: Secondary | ICD-10-CM | POA: Diagnosis not present

## 2016-12-10 DIAGNOSIS — Z01818 Encounter for other preprocedural examination: Secondary | ICD-10-CM | POA: Diagnosis not present

## 2016-12-10 DIAGNOSIS — R972 Elevated prostate specific antigen [PSA]: Secondary | ICD-10-CM | POA: Diagnosis not present

## 2016-12-10 DIAGNOSIS — N400 Enlarged prostate without lower urinary tract symptoms: Secondary | ICD-10-CM | POA: Diagnosis not present

## 2016-12-10 DIAGNOSIS — Z136 Encounter for screening for cardiovascular disorders: Secondary | ICD-10-CM | POA: Diagnosis not present

## 2016-12-27 DIAGNOSIS — H04123 Dry eye syndrome of bilateral lacrimal glands: Secondary | ICD-10-CM | POA: Diagnosis not present

## 2016-12-27 DIAGNOSIS — H25813 Combined forms of age-related cataract, bilateral: Secondary | ICD-10-CM | POA: Diagnosis not present

## 2016-12-27 DIAGNOSIS — E119 Type 2 diabetes mellitus without complications: Secondary | ICD-10-CM | POA: Diagnosis not present

## 2017-01-17 DIAGNOSIS — C61 Malignant neoplasm of prostate: Secondary | ICD-10-CM | POA: Diagnosis not present

## 2017-01-21 ENCOUNTER — Encounter: Payer: Self-pay | Admitting: Radiation Oncology

## 2017-02-04 NOTE — Progress Notes (Signed)
GU Location of Tumor / Histology: prostatic adenocarcinoma  If Prostate Cancer, Gleason Score is (4 + 3) and PSA is (5.4-5.9). Prostate volume: 45.5 cm on 09/07/2016  Paul Moon was evaluated by Delmar Landau, NP at his PCP's office following an episode of urgency. Reports he was treated for proctitis three times before being referred to Dr. Alyson Ingles at [facility] in September 2017.     Biopsies of prostate (if applicable) revealed:     Past/Anticipated interventions by urology, if any: biopsy, referral back to Dr. Tammi Klippel  Past/Anticipated interventions by medical oncology, if any: no  Weight changes, if any: no  Bowel/Bladder complaints, if any: Difficulty emptying his bladder, urinary frequency, intermittent stream, urgency, weak stream, hesitancy, nocturia x 1- 2. Denies dysuria, hematuria, leakage, or incontinence. IPSS 10. Mild ED with aid of Viagra. Reports urinary leakage prior to arousal. Denies blood in semen.   Nausea/Vomiting, if any: no  Pain issues, if any:  Heaviness or pressure in low abdomen has resolved. Reports an occasional "twinge" in his "urethra." Reports feeling abnormal (unable to describe) in low abdomen.   SAFETY ISSUES:  Prior radiation? no  Pacemaker/ICD? no  Possible current pregnancy? no  Is the patient on methotrexate? no  Current Complaints / other details:  71 year old male. Divorced. One son and one daughter. Retired/some real estate.  Father with hx of renal cancer. Paternal 1/2 aunt with hx of breast ca. Reports his maternal grandmother is living at the age of 56. Reports mother is active and drives at the age of 42.

## 2017-02-05 ENCOUNTER — Other Ambulatory Visit: Payer: Self-pay

## 2017-02-05 ENCOUNTER — Ambulatory Visit
Admission: RE | Admit: 2017-02-05 | Discharge: 2017-02-05 | Disposition: A | Discharge status: Home / Self Care | Payer: Medicare Other | Source: Ambulatory Visit | Attending: Radiation Oncology | Admitting: Radiation Oncology

## 2017-02-05 ENCOUNTER — Encounter: Payer: Self-pay | Admitting: Radiation Oncology

## 2017-02-05 VITALS — BP 134/66 | HR 89 | Resp 18 | Ht 67.0 in | Wt 218.8 lb

## 2017-02-05 DIAGNOSIS — C61 Malignant neoplasm of prostate: Secondary | ICD-10-CM

## 2017-02-05 DIAGNOSIS — G4730 Sleep apnea, unspecified: Secondary | ICD-10-CM | POA: Insufficient documentation

## 2017-02-05 DIAGNOSIS — Z87891 Personal history of nicotine dependence: Secondary | ICD-10-CM | POA: Diagnosis not present

## 2017-02-05 DIAGNOSIS — I10 Essential (primary) hypertension: Secondary | ICD-10-CM | POA: Insufficient documentation

## 2017-02-05 DIAGNOSIS — R972 Elevated prostate specific antigen [PSA]: Secondary | ICD-10-CM | POA: Diagnosis not present

## 2017-02-05 NOTE — Progress Notes (Signed)
See progress note under physician encounter. 

## 2017-02-05 NOTE — Progress Notes (Signed)
Radiation Oncology         [phone] ________________________________  Outpatient Follow-Up New Visit  Name: Paul Moon MRN: [id number]  Date: 02/05/2017  DOB: [birth year]  [id number], Paul Edelman, MD  Paul Ingles Candee Furbish, MD   REFERRING PHYSICIAN: Cleon Gustin, MD  DIAGNOSIS: 71 y.o. gentleman with Stage T1c adenocarcinoma of the prostate with Gleason Score of 4+3, and PSA of 5.49    ICD-10-CM   1. Malignant neoplasm of prostate ([place]) C61   2. Prostate cancer [facility]) C61    HISTORY OF PRESENT ILLNESS: Paul Moon is a 71 y.o. male with a diagnosis of prostate cancer. He was initially noted to have an elevated PSA of 5.0 by his primary care physician, Dr. Thressa Sheller.  Accordingly, he was referred for evaluation in urology by Dr. [name],  digital rectal examination was performed at that time and per patient, revealed no nodules.  He had a TRUSPBx on 12/15/15 which revealed Gleason 3+3 in 2/12 cores.  He opted to proceed with active surveillance.  Repeat PSA on 04/04/16 was 13.10. The patient proceeded to repeat transrectal ultrasound with 12 biopsies of the prostate on 05/24/16.  The prostate volume measured 65.5 cc.  Out of 12 core biopsies, 4 were positive.  The maximum Gleason score was 3+3, and this was seen in the left base, left mid gland, right apex and right apex lateral.  Of note regarding the biopsies obtained on 05/24/16: The prostate diagram showed Gleason score 3+3=6 of the left base, but the diagnosis section mentioned 3+4=7 of the left base.  CT A/P on 01/22/16 revealed grossly normal prostate and seminal vesicles and small, sub-centimeter iliac lymph nodes.  PSA 06/2015: 4.8 07/2015: 5.0 09/2015: 4.9 04/2016: 13.1  INTERVAL HISTORY: Paul Moon returns today after seeking multiple second opinions from [place] [facility] since initially seeing [place] in April 2018. Most recently, he had an MRI fusion biopsy at [facility] on 12/12/2016 which  revealed 4 out of 16 cores positive with Gleason 4+3 in two of those cores. The volume of his prostate measured 45 cc shown on MRI in July 2018. He was hoping to have focal laser ablation at [facility] but was not found to be a candidate based on his most recent pathology. He has kindly been referred back to [place] today for discussion of potential radiation treatment options. His most recent PSA of record from 10/08/2016 was 5.49, though patient reports having a repeat PSA in 12/2016 with the [facility] clinic that was in the 5s as well.   PREVIOUS RADIATION THERAPY: No  PAST MEDICAL HISTORY:  Past Medical History:  Diagnosis Date  . Arthritis    INDEX FINGER JOINT LEFT HAND  . Enlarged prostate   . H/O bronchitis JAN 2015   PT FEELS MUCH BETTER - STILL HAS SOME COUGH - BUT NO CHEST CONGESTION  . Hypertension   . Mental disorder    BIPOLAR  . Pinched cervical nerve root    PT STATES C 7-8 PINCHED NERVE CAUSING NUMBNESS MIDDLE, RING AND LITTLE FINGER LEFT HAND - NO PAIN - STATES HE IS TRYING TO AVOID SURGERY.  . Pneumonia    2 YRS AGO  . Prostate cancer ([place])   . Sleep apnea    USES CPAP  SETTING 4-13      PAST SURGICAL HISTORY: Past Surgical History:  Procedure Laterality Date  . CIRCUMCISION    . KNEE ARTHROSCOPY Left 04/28/2013   Procedure: LEFT ARTHROSCOPY KNEE  WITH DEBRIDEMENT meniscal debridement;  Surgeon: Gearlean Alf, MD;  Location: [facility];  Service: Orthopedics;  Laterality: Left;  . KNEE SURGERY  2008   right  . PROSTATE BIOPSY    . TONSILLECTOMY      FAMILY HISTORY:  Family History  Problem Relation Age of Onset  . Renal cancer Father   . Heart attack Paternal Grandmother   . Heart attack Paternal Grandfather   . Cancer Paternal Aunt        breast    SOCIAL HISTORY:  Social History   Socioeconomic History  . Marital status: Divorced    Spouse name: Not on file  . Number of children: 2  . Years of education: College  . Highest education level: Not on file    Social Needs  . Financial resource strain: Not on file  . Food insecurity - worry: Not on file  . Food insecurity - inability: Not on file  . Transportation needs - medical: Not on file  . Transportation needs - non-medical: Not on file  Occupational History  . Occupation: Semi-Retired  Tobacco Use  . Smoking status: Former Smoker    Packs/day: 1.50    Years: 20.00    Pack years: 30.00    Types: Cigarettes    Last attempt to quit: 05/26/1985    Years since quitting: 31.7  . Smokeless tobacco: Never Used  Substance and Sexual Activity  . Alcohol use: Yes    Comment: wine from time to time  . Drug use: No  . Sexual activity: Yes  Other Topics Concern  . Not on file  Social History Narrative   Patient lives at home alone.   Caffeine Use: occasionally    ALLERGIES: Hctz [hydrochlorothiazide]; Lactose intolerance (gi); Lisinopril; Risperdal [risperidone]; Seroquel [quetiapine fumarate]; and Verapamil  MEDICATIONS:  Current Outpatient Medications  Medication Sig Dispense Refill  . cholecalciferol (VITAMIN D) 1000 UNITS tablet Take 1,000 Units by mouth daily.    . finasteride (PROSCAR) 5 MG tablet Take 5 mg by mouth.    Marland Kitchen GARLIC PO Take by mouth.    . losartan (COZAAR) 100 MG tablet Take 100 mg by mouth at bedtime.     . Omega-3 Fatty Acids (FISH OIL) 1200 MG CAPS Take 1,200 mg by mouth daily.    . sildenafil (REVATIO) 20 MG tablet Take 20 mg by mouth 3 (three) times daily. Reports taking 40-60 mg as needed.     No current facility-administered medications for this encounter.     REVIEW OF SYSTEMS:  On review of systems, the patient reports that he is doing well overall. He denies any chest pain, shortness of breath, cough, fevers, chills, night sweats, or unintended weight changes. He denies any bowel disturbances, and denies nausea or vomiting. He reports an abnormal feeling of heaviness/pressure in his low abdomen. He denies any new musculoskeletal or joint aches or pains.  His IPSS was 10, indicating moderate urinary symptoms with incomplete emptying, frequency, intermittency, urgency, weak stream, hesitancy, and nocturia x1-2. He denies dysuria, hematuria, leakage, or incontinence. He reports an occasional "twinge" in his urethra. He has ED but is able to complete sexual activity with Viagra. He reports urinary leakage prior to arousal. He denies hematospermia. A complete review of systems is obtained and is otherwise negative.   PHYSICAL EXAM:  Wt Readings from Last 3 Encounters:  02/05/17 218 lb 12.8 oz (99.2 kg)  06/19/16 219 lb 9.6 oz (99.6 kg)  02/18/14 221 lb (100.2 kg)  Temp Readings from Last 3 Encounters:  04/28/13 97.2 F (36.2 C) (Oral)  04/26/13 97.6 F (36.4 C) (Oral)  03/22/13 97.7 F (36.5 C) (Oral)   BP Readings from Last 3 Encounters:  02/05/17 134/66  06/19/16 (!) 145/76  02/18/14 (!) 142/83   Pulse Readings from Last 3 Encounters:  02/05/17 89  06/19/16 74  02/18/14 80   Pain Assessment Pain Score: 0-No pain/10  In general this is a well appearing African-American man in no acute distress. He is alert and oriented x4 and appropriate throughout the examination. HEENT reveals that the patient is normocephalic, atraumatic. Skin is intact without any evidence of gross lesions. Cardiovascular exam reveals a regular rate and rhythm, no clicks rubs or murmurs are auscultated. Chest is clear to auscultation bilaterally. Lymphatic assessment is performed and does not reveal any adenopathy in the cervical, supraclavicular, axillary, or inguinal chains. Abdomen is soft and nontender to palpation with normal BS in all 4 quadrants.  Lower extremities are negative for pretibial pitting edema, deep calf tenderness, cyanosis or clubbing.   KPS = 100  100 - Normal; no complaints; no evidence of disease. 90   - Able to carry on normal activity; minor signs or symptoms of disease. 80   - Normal activity with effort; some signs or symptoms of  disease. 70   - Cares for self; unable to carry on normal activity or to do active work. 60   - Requires occasional assistance, but is able to care for most of his personal needs. 50   - Requires considerable assistance and frequent medical care. 12   - Disabled; requires special care and assistance. 82   - Severely disabled; hospital admission is indicated although death not imminent. 40   - Very sick; hospital admission necessary; active supportive treatment necessary. 10   - Moribund; fatal processes progressing rapidly. 0     - Dead  Karnofsky DA, Abelmann [place], Craver LS and Burchenal [facility] [phone]) The use of the nitrogen mustards in the palliative treatment of carcinoma: with particular reference to bronchogenic carcinoma Cancer 1 634-56  LABORATORY DATA:  Lab Results  Component Value Date   WBC 4.4 04/26/2013   HGB 13.1 04/26/2013   HCT 40.2 04/26/2013   MCV 93.7 04/26/2013   PLT 273 04/26/2013   Lab Results  Component Value Date   NA 140 04/26/2013   K 4.4 04/26/2013   CL 100 04/26/2013   CO2 30 04/26/2013   No results found for: ALT, AST, GGT, ALKPHOS, BILITOT   RADIOGRAPHY: No results found.    IMPRESSION/PLAN: 1. 71 y.o. gentleman with Stage T1c adenocarcinoma of the prostate with Gleason Score of 4+3, and PSA of 5.49.  We discussed the patient's workup and outlined the nature of prostate cancer in this setting. The patient's T stage, Gleason's score, and PSA put him into the intermediate risk group. Accordingly, he is eligible for a variety of potential treatment options including brachytherapy, 8 weeks of external radiation or 5 weeks of external radiation followed by a brachytherapy boost. We discussed the available radiation techniques, and focused on the details and logistics and delivery. We discussed and outlined the risks, benefits, short and long-term effects associated with radiotherapy and compared and contrasted these with prostatectomy. We also discussed the role  of SpaceOAR in reducing the rectal toxicity associated with radiotherapy.   At the conclusion of our conversation, the patient is interested in moving forward with brachytherapy and use of SpaceOAR to reduce rectal toxicity  from radiotherapy.  We will share our discussion with Dr. Alyson Ingles and move forward with scheduling his CT [facility] planning appointment in the future.  He is most interested in having his procedure in 05/2017, as he is expecting a new granddaughter and grandson in 03/2017.  The patient met briefly with Romie Jumper in our office who will be working closely with him to coordinate OR scheduling and pre and post procedure appointments.  We will contact the pharmaceutical rep to ensure that [place] is available at the time of procedure.  He will have a prostate MRI following his post-seed CT SIM to confirm appropriate distribution of the [place].   We spent 65 minutes face to face with the patient and more than 50% of that time was spent in counseling and/or coordination of care.   Nicholos Johns, PA-C    Tyler Pita, MD  [facility] Direct Dial: [phone]  Fax: [phone] [email or web address]  Skype  [email or web address]  This document serves as a record of services personally performed by Tyler Pita, MD and Freeman Caldron, PA-C. It was created on their behalf by Rae Lips, a trained medical scribe. The creation of this record is based on the scribe's personal observations and the providers' statements to them. This document has been checked and approved by the attending providers.

## 2017-03-03 DIAGNOSIS — C61 Malignant neoplasm of prostate: Secondary | ICD-10-CM | POA: Diagnosis not present

## 2017-03-03 DIAGNOSIS — R350 Frequency of micturition: Secondary | ICD-10-CM | POA: Diagnosis not present

## 2017-03-19 ENCOUNTER — Telehealth: Payer: Self-pay | Admitting: *Deleted

## 2017-03-19 NOTE — Telephone Encounter (Signed)
Called patient to inform of implant date, spoke with patient and he is aware of this date. 

## 2017-03-24 ENCOUNTER — Encounter: Payer: Self-pay | Admitting: Medical Oncology

## 2017-03-25 ENCOUNTER — Other Ambulatory Visit: Payer: Self-pay | Admitting: Urology

## 2017-04-03 ENCOUNTER — Telehealth: Payer: Self-pay | Admitting: *Deleted

## 2017-04-03 NOTE — Telephone Encounter (Signed)
CALLED PATIENT TO REMIND OF APPTS. FOR 04-04-17, SPOKE WITH PATIENT AND HE IS AWARE OF THESE APPTS.

## 2017-04-04 ENCOUNTER — Other Ambulatory Visit: Payer: Self-pay | Admitting: Urology

## 2017-04-04 ENCOUNTER — Encounter (HOSPITAL_COMMUNITY)
Admission: RE | Admit: 2017-04-04 | Discharge: 2017-04-04 | Disposition: A | Discharge status: Home / Self Care | Payer: Medicare Other | Source: Ambulatory Visit | Attending: Urology | Admitting: Urology

## 2017-04-04 ENCOUNTER — Ambulatory Visit (HOSPITAL_COMMUNITY)
Admission: RE | Admit: 2017-04-04 | Discharge: 2017-04-04 | Disposition: A | Discharge status: Home / Self Care | Payer: Medicare Other | Source: Ambulatory Visit | Attending: Urology | Admitting: Urology

## 2017-04-04 ENCOUNTER — Ambulatory Visit
Admission: RE | Admit: 2017-04-04 | Discharge: 2017-04-04 | Disposition: A | Discharge status: Home / Self Care | Payer: Medicare Other | Source: Ambulatory Visit | Attending: Radiation Oncology | Admitting: Radiation Oncology

## 2017-04-04 ENCOUNTER — Other Ambulatory Visit (HOSPITAL_COMMUNITY): Payer: Self-pay

## 2017-04-04 ENCOUNTER — Encounter: Payer: Self-pay | Admitting: Medical Oncology

## 2017-04-04 DIAGNOSIS — Z01818 Encounter for other preprocedural examination: Secondary | ICD-10-CM | POA: Diagnosis not present

## 2017-04-04 DIAGNOSIS — C61 Malignant neoplasm of prostate: Secondary | ICD-10-CM | POA: Insufficient documentation

## 2017-04-04 DIAGNOSIS — Z01812 Encounter for preprocedural laboratory examination: Secondary | ICD-10-CM | POA: Insufficient documentation

## 2017-04-04 NOTE — Progress Notes (Signed)
Mr. Hands states he was seen at [facility] and the [facility] for 2nd opinions. He had a repeat biopsy at [facility] and PSA. He was hoping to have ablation therapy at [facility] but is not a candidate. He states he saw Dr. Tammi Klippel 02/05/17 to discuss brachytherapy with SpaceOar. He attend the prostate support group here at [facility] and after discussion among members he is reassured that he has chosen  the best treatment for him. I will continue to follow and asked him to call me with questions or concerns. He voiced understanding.

## 2017-04-04 NOTE — Progress Notes (Signed)
  Radiation Oncology         [phone] ________________________________  Name: Paul Moon MRN: [id number]  Date: 04/04/2017  DOB: [birth year]  SIMULATION AND TREATMENT PLANNING NOTE PUBIC ARCH STUDY  [id number], Aaron Edelman, MD  Cleon Gustin, MD  DIAGNOSIS: 72 y.o. gentleman with Stage T1c adenocarcinoma of the prostate with Gleason Score of 4+3, and PSA of 5.49.    ICD-10-CM   1. Prostate cancer ([place]) C61     COMPLEX SIMULATION:  The patient presented today for evaluation for possible prostate seed implant. He was brought to the radiation planning suite and placed supine on the CT couch. A 3-dimensional image study set was obtained in upload to the planning computer. There, on each axial slice, I contoured the prostate gland. Then, using three-dimensional radiation planning tools I reconstructed the prostate in view of the structures from the transperineal needle pathway to assess for possible pubic arch interference. In doing so, I did not appreciate any pubic arch interference. Also, the patient's prostate volume was estimated based on the drawn structure. The volume was 44 cc.  Given the pubic arch appearance and prostate volume, patient remains a good candidate to proceed with prostate seed implant. Today, he freely provided informed written consent to proceed.    PLAN: The patient will undergo prostate seed implant to 145 Gy.   ________________________________  Sheral Apley. Tammi Klippel, M.D.     This document serves as a record of services personally performed by Tyler Pita MD. It was created on his behalf by Delton Coombes, a trained medical scribe. The creation of this record is based on the scribe's personal observations and the provider's statements to them.

## 2017-05-05 DIAGNOSIS — R7309 Other abnormal glucose: Secondary | ICD-10-CM | POA: Diagnosis not present

## 2017-05-05 DIAGNOSIS — C61 Malignant neoplasm of prostate: Secondary | ICD-10-CM | POA: Diagnosis not present

## 2017-05-05 DIAGNOSIS — I10 Essential (primary) hypertension: Secondary | ICD-10-CM | POA: Diagnosis not present

## 2017-05-05 DIAGNOSIS — N400 Enlarged prostate without lower urinary tract symptoms: Secondary | ICD-10-CM | POA: Diagnosis not present

## 2017-05-05 DIAGNOSIS — I129 Hypertensive chronic kidney disease with stage 1 through stage 4 chronic kidney disease, or unspecified chronic kidney disease: Secondary | ICD-10-CM | POA: Diagnosis not present

## 2017-05-05 DIAGNOSIS — N182 Chronic kidney disease, stage 2 (mild): Secondary | ICD-10-CM | POA: Diagnosis not present

## 2017-05-05 DIAGNOSIS — R1030 Lower abdominal pain, unspecified: Secondary | ICD-10-CM | POA: Diagnosis not present

## 2017-05-05 DIAGNOSIS — R7303 Prediabetes: Secondary | ICD-10-CM | POA: Diagnosis not present

## 2017-05-13 DIAGNOSIS — C61 Malignant neoplasm of prostate: Secondary | ICD-10-CM | POA: Diagnosis not present

## 2017-05-16 ENCOUNTER — Telehealth: Payer: Self-pay | Admitting: *Deleted

## 2017-05-16 NOTE — Telephone Encounter (Signed)
Called patient to remind of lab work for 05-19-17 for implant on 05-26-17, pt. to arrive @ 9:45 am @ [facility], spoke with patient and he is aware of this and good with this

## 2017-05-19 ENCOUNTER — Encounter (HOSPITAL_COMMUNITY)
Admission: RE | Admit: 2017-05-19 | Discharge: 2017-05-19 | Disposition: A | Discharge status: Home / Self Care | Payer: Medicare Other | Source: Ambulatory Visit | Attending: Urology | Admitting: Urology

## 2017-05-19 ENCOUNTER — Encounter (HOSPITAL_BASED_OUTPATIENT_CLINIC_OR_DEPARTMENT_OTHER): Payer: Self-pay | Admitting: *Deleted

## 2017-05-19 DIAGNOSIS — Z01818 Encounter for other preprocedural examination: Secondary | ICD-10-CM | POA: Insufficient documentation

## 2017-05-19 LAB — COMPREHENSIVE METABOLIC PANEL
ALBUMIN: 3.8 g/dL (ref 3.5–5.0)
ALT: 25 U/L (ref 17–63)
AST: 26 U/L (ref 15–41)
Alkaline Phosphatase: 72 U/L (ref 38–126)
Anion gap: 7 (ref 5–15)
BILIRUBIN TOTAL: 0.8 mg/dL (ref 0.3–1.2)
BUN: 13 mg/dL (ref 6–20)
CHLORIDE: 105 mmol/L (ref 101–111)
CO2: 29 mmol/L (ref 22–32)
Calcium: 9.2 mg/dL (ref 8.9–10.3)
Creatinine, Ser: 1.19 mg/dL (ref 0.61–1.24)
GFR calc Af Amer: >60 mL/min (ref >60)
GFR calc non Af Amer: 60 mL/min — ABNORMAL LOW (ref >60)
GLUCOSE: 168 mg/dL — AB (ref 65–99)
POTASSIUM: 4 mmol/L (ref 3.5–5.1)
Sodium: 141 mmol/L (ref 135–145)
Total Protein: 7.3 g/dL (ref 6.5–8.1)

## 2017-05-19 LAB — CBC
HCT: 41.6 % (ref 39.0–52.0)
Hemoglobin: 13.7 g/dL (ref 13.0–17.0)
MCH: 31.1 pg (ref 26.0–34.0)
MCHC: 32.9 g/dL (ref 30.0–36.0)
MCV: 94.5 fL (ref 78.0–100.0)
Platelets: 288 10*3/uL (ref 150–400)
RBC: 4.4 MIL/uL (ref 4.22–5.81)
RDW: 15.6 % — ABNORMAL HIGH (ref 11.5–15.5)
WBC: 3.6 10*3/uL — AB (ref 4.0–10.5)

## 2017-05-19 LAB — APTT: aPTT: 30 seconds (ref 24–36)

## 2017-05-19 LAB — PROTIME-INR
INR: 1
Prothrombin Time: 13.1 seconds (ref 11.4–15.2)

## 2017-05-20 ENCOUNTER — Encounter (HOSPITAL_BASED_OUTPATIENT_CLINIC_OR_DEPARTMENT_OTHER): Payer: Self-pay | Admitting: *Deleted

## 2017-05-20 ENCOUNTER — Other Ambulatory Visit: Payer: Self-pay

## 2017-05-20 NOTE — Progress Notes (Addendum)
SPOKE W/ PT VIA PHONE FOR PRE-OP INTERVIEW.  NPO AFTER MN W/ EXCEPTION CLEAR LIQUIDS UNTIL 0700 (NO CREAM/MILK PRODUCTS).  ARRIVE AT 1100.  CURRENT LAB RESULTS (DATED 05-19-2017), CXR, AND EKG IN CHART AND Epic.  WILL TAKE FINASTERIDE AM DOS W/ SIPS OF WATER AND DO ONE FLEET ENEMA.  PT TO BRING CPAP.

## 2017-05-22 ENCOUNTER — Telehealth: Payer: Self-pay | Admitting: *Deleted

## 2017-05-22 NOTE — Telephone Encounter (Signed)
CALLED PATIENT TO REMIND OF IMPLANT FOR 05-26-17 , SPOKE WITH PATIENT AND HE IS AWARE OF THIS PROCEDURE.

## 2017-05-26 ENCOUNTER — Ambulatory Visit (HOSPITAL_BASED_OUTPATIENT_CLINIC_OR_DEPARTMENT_OTHER): Payer: Medicare Other | Admitting: Anesthesiology

## 2017-05-26 ENCOUNTER — Encounter (HOSPITAL_BASED_OUTPATIENT_CLINIC_OR_DEPARTMENT_OTHER): Payer: Self-pay

## 2017-05-26 ENCOUNTER — Encounter (HOSPITAL_BASED_OUTPATIENT_CLINIC_OR_DEPARTMENT_OTHER)
Admission: RE | Disposition: A | Discharge status: Home / Self Care | Payer: Self-pay | Source: Ambulatory Visit | Attending: Urology

## 2017-05-26 ENCOUNTER — Ambulatory Visit (HOSPITAL_COMMUNITY): Payer: Medicare Other

## 2017-05-26 ENCOUNTER — Other Ambulatory Visit: Payer: Self-pay

## 2017-05-26 ENCOUNTER — Ambulatory Visit (HOSPITAL_BASED_OUTPATIENT_CLINIC_OR_DEPARTMENT_OTHER)
Admission: RE | Admit: 2017-05-26 | Discharge: 2017-05-26 | Disposition: A | Discharge status: Home / Self Care | Payer: Medicare Other | Source: Ambulatory Visit | Attending: Urology | Admitting: Urology

## 2017-05-26 DIAGNOSIS — Z87891 Personal history of nicotine dependence: Secondary | ICD-10-CM | POA: Diagnosis not present

## 2017-05-26 DIAGNOSIS — C61 Malignant neoplasm of prostate: Secondary | ICD-10-CM | POA: Diagnosis not present

## 2017-05-26 DIAGNOSIS — M1712 Unilateral primary osteoarthritis, left knee: Secondary | ICD-10-CM | POA: Insufficient documentation

## 2017-05-26 DIAGNOSIS — Z6833 Body mass index (BMI) 33.0-33.9, adult: Secondary | ICD-10-CM | POA: Diagnosis not present

## 2017-05-26 DIAGNOSIS — E669 Obesity, unspecified: Secondary | ICD-10-CM | POA: Insufficient documentation

## 2017-05-26 DIAGNOSIS — I10 Essential (primary) hypertension: Secondary | ICD-10-CM | POA: Insufficient documentation

## 2017-05-26 DIAGNOSIS — Z888 Allergy status to other drugs, medicaments and biological substances status: Secondary | ICD-10-CM | POA: Insufficient documentation

## 2017-05-26 DIAGNOSIS — M5412 Radiculopathy, cervical region: Secondary | ICD-10-CM | POA: Diagnosis not present

## 2017-05-26 DIAGNOSIS — M19042 Primary osteoarthritis, left hand: Secondary | ICD-10-CM | POA: Diagnosis not present

## 2017-05-26 DIAGNOSIS — F319 Bipolar disorder, unspecified: Secondary | ICD-10-CM | POA: Diagnosis not present

## 2017-05-26 DIAGNOSIS — E739 Lactose intolerance, unspecified: Secondary | ICD-10-CM | POA: Insufficient documentation

## 2017-05-26 DIAGNOSIS — G4733 Obstructive sleep apnea (adult) (pediatric): Secondary | ICD-10-CM | POA: Insufficient documentation

## 2017-05-26 DIAGNOSIS — Z9989 Dependence on other enabling machines and devices: Secondary | ICD-10-CM | POA: Insufficient documentation

## 2017-05-26 DIAGNOSIS — Z8249 Family history of ischemic heart disease and other diseases of the circulatory system: Secondary | ICD-10-CM | POA: Diagnosis not present

## 2017-05-26 DIAGNOSIS — R7303 Prediabetes: Secondary | ICD-10-CM | POA: Insufficient documentation

## 2017-05-26 DIAGNOSIS — R51 Headache: Secondary | ICD-10-CM | POA: Diagnosis not present

## 2017-05-26 HISTORY — PX: RADIOACTIVE SEED IMPLANT: SHX5150

## 2017-05-26 HISTORY — DX: Benign prostatic hyperplasia with lower urinary tract symptoms: N40.1

## 2017-05-26 HISTORY — PX: SPACE OAR INSTILLATION: SHX6769

## 2017-05-26 HISTORY — DX: Bipolar disorder, unspecified: F31.9

## 2017-05-26 HISTORY — DX: Prediabetes: R73.03

## 2017-05-26 HISTORY — DX: Personal history of other diseases of the musculoskeletal system and connective tissue: Z87.39

## 2017-05-26 HISTORY — DX: Major depressive disorder, single episode, unspecified: F32.9

## 2017-05-26 HISTORY — DX: Dependence on other enabling machines and devices: Z99.89

## 2017-05-26 HISTORY — DX: Depression, unspecified: F32.A

## 2017-05-26 HISTORY — DX: Presence of spectacles and contact lenses: Z97.3

## 2017-05-26 HISTORY — DX: Obstructive sleep apnea (adult) (pediatric): G47.33

## 2017-05-26 SURGERY — INSERTION, RADIATION SOURCE, PROSTATE
Anesthesia: General | Site: Prostate

## 2017-05-26 MED ORDER — MIDAZOLAM HCL 5 MG/5ML IJ SOLN
INTRAMUSCULAR | Status: DC | PRN
  Administered 2017-05-26: 2 mg via INTRAVENOUS

## 2017-05-26 MED ORDER — MIDAZOLAM HCL 2 MG/2ML IJ SOLN
INTRAMUSCULAR | Status: AC
  Filled 2017-05-26: qty 2

## 2017-05-26 MED ORDER — FENTANYL CITRATE (PF) 100 MCG/2ML IJ SOLN
INTRAMUSCULAR | Status: AC
  Filled 2017-05-26: qty 2

## 2017-05-26 MED ORDER — MEPERIDINE HCL 25 MG/ML IJ SOLN
6.2500 mg | INTRAMUSCULAR | Status: DC | PRN
  Filled 2017-05-26: qty 1

## 2017-05-26 MED ORDER — CIPROFLOXACIN IN D5W 400 MG/200ML IV SOLN
INTRAVENOUS | Status: AC
  Filled 2017-05-26: qty 200

## 2017-05-26 MED ORDER — KETOROLAC TROMETHAMINE 30 MG/ML IJ SOLN
INTRAMUSCULAR | Status: AC
  Filled 2017-05-26: qty 1

## 2017-05-26 MED ORDER — LACTATED RINGERS IV SOLN
INTRAVENOUS | Status: DC
  Administered 2017-05-26 (×2): via INTRAVENOUS
  Filled 2017-05-26: qty 1000

## 2017-05-26 MED ORDER — DEXAMETHASONE SODIUM PHOSPHATE 4 MG/ML IJ SOLN
INTRAMUSCULAR | Status: DC | PRN
  Administered 2017-05-26: 10 mg via INTRAVENOUS

## 2017-05-26 MED ORDER — KETOROLAC TROMETHAMINE 30 MG/ML IJ SOLN
INTRAMUSCULAR | Status: DC | PRN
  Administered 2017-05-26: 30 mg via INTRAVENOUS

## 2017-05-26 MED ORDER — EPHEDRINE SULFATE-NACL 50-0.9 MG/10ML-% IV SOSY
PREFILLED_SYRINGE | INTRAVENOUS | Status: DC | PRN
  Administered 2017-05-26 (×3): 10 mg via INTRAVENOUS

## 2017-05-26 MED ORDER — IOHEXOL 300 MG/ML  SOLN
INTRAMUSCULAR | Status: DC | PRN
  Administered 2017-05-26: 7 mL

## 2017-05-26 MED ORDER — FLEET ENEMA 7-19 GM/118ML RE ENEM
1.0000 | ENEMA | Freq: Once | RECTAL | Status: DC
  Filled 2017-05-26: qty 1

## 2017-05-26 MED ORDER — FENTANYL CITRATE (PF) 100 MCG/2ML IJ SOLN
25.0000 ug | INTRAMUSCULAR | Status: DC | PRN
  Filled 2017-05-26: qty 1

## 2017-05-26 MED ORDER — PROPOFOL 10 MG/ML IV BOLUS
INTRAVENOUS | Status: DC | PRN
  Administered 2017-05-26: 20 mg via INTRAVENOUS
  Administered 2017-05-26: 30 mg via INTRAVENOUS
  Administered 2017-05-26: 200 mg via INTRAVENOUS

## 2017-05-26 MED ORDER — CIPROFLOXACIN IN D5W 400 MG/200ML IV SOLN
400.0000 mg | INTRAVENOUS | Status: AC
  Administered 2017-05-26: 400 mg via INTRAVENOUS
  Filled 2017-05-26: qty 200

## 2017-05-26 MED ORDER — ONDANSETRON HCL 4 MG/2ML IJ SOLN
INTRAMUSCULAR | Status: DC | PRN
  Administered 2017-05-26: 4 mg via INTRAVENOUS

## 2017-05-26 MED ORDER — LIDOCAINE 2% (20 MG/ML) 5 ML SYRINGE
INTRAMUSCULAR | Status: DC | PRN
  Administered 2017-05-26: 100 mg via INTRAVENOUS

## 2017-05-26 MED ORDER — ONDANSETRON HCL 4 MG/2ML IJ SOLN
INTRAMUSCULAR | Status: AC
  Filled 2017-05-26: qty 2

## 2017-05-26 MED ORDER — HYDROCODONE-ACETAMINOPHEN 5-325 MG PO TABS: 1.0000 | ORAL_TABLET | ORAL | 0 refills | Status: DC | PRN

## 2017-05-26 MED ORDER — EPHEDRINE 5 MG/ML INJ
INTRAVENOUS | Status: AC
  Filled 2017-05-26: qty 10

## 2017-05-26 MED ORDER — FENTANYL CITRATE (PF) 100 MCG/2ML IJ SOLN
INTRAMUSCULAR | Status: DC | PRN
  Administered 2017-05-26 (×8): 25 ug via INTRAVENOUS

## 2017-05-26 MED ORDER — DEXAMETHASONE SODIUM PHOSPHATE 10 MG/ML IJ SOLN
INTRAMUSCULAR | Status: AC
  Filled 2017-05-26: qty 1

## 2017-05-26 MED ORDER — LIDOCAINE 2% (20 MG/ML) 5 ML SYRINGE
INTRAMUSCULAR | Status: AC
  Filled 2017-05-26: qty 5

## 2017-05-26 MED ORDER — PROPOFOL 10 MG/ML IV BOLUS
INTRAVENOUS | Status: AC
  Filled 2017-05-26: qty 40

## 2017-05-26 SURGICAL SUPPLY — 28 items
BAG URINE DRAINAGE (UROLOGICAL SUPPLIES) ×2 IMPLANT
BLADE CLIPPER SURG (BLADE) ×2 IMPLANT
CATH FOLEY 2WAY SLVR  5CC 16FR (CATHETERS) ×1
CATH FOLEY 2WAY SLVR 5CC 16FR (CATHETERS) ×1 IMPLANT
CATH ROBINSON RED A/P 20FR (CATHETERS) ×2 IMPLANT
CLOTH BEACON ORANGE TIMEOUT ST (SAFETY) ×2 IMPLANT
COVER BACK TABLE 60X90IN (DRAPES) ×2 IMPLANT
COVER MAYO STAND STRL (DRAPES) ×2 IMPLANT
DRSG TEGADERM 4X4.75 (GAUZE/BANDAGES/DRESSINGS) ×2 IMPLANT
DRSG TEGADERM 8X12 (GAUZE/BANDAGES/DRESSINGS) ×2 IMPLANT
GAUZE SPONGE 4X4 12PLY STRL LF (GAUZE/BANDAGES/DRESSINGS) ×2 IMPLANT
GLOVE BIO SURGEON STRL SZ8 (GLOVE) ×2 IMPLANT
GLOVE ECLIPSE 8.0 STRL XLNG CF (GLOVE) ×8 IMPLANT
GOWN STRL REUS W/ TWL XL LVL3 (GOWN DISPOSABLE) ×2 IMPLANT
GOWN STRL REUS W/TWL XL LVL3 (GOWN DISPOSABLE) ×4 IMPLANT
HOLDER FOLEY CATH W/STRAP (MISCELLANEOUS) ×2 IMPLANT
IMPL SPACEOAR SYSTEM 10ML (MISCELLANEOUS) ×1 IMPLANT
IMPLANT SPACEOAR SYSTEM 10ML (MISCELLANEOUS) ×2
IV NS 1000ML (IV SOLUTION) ×1
IV NS 1000ML BAXH (IV SOLUTION) ×1 IMPLANT
KIT TURNOVER CYSTO (KITS) ×2 IMPLANT
PACK CYSTO (CUSTOM PROCEDURE TRAY) ×2 IMPLANT
SURGILUBE 2OZ TUBE FLIPTOP (MISCELLANEOUS) ×2 IMPLANT
SUT BONE WAX W31G (SUTURE) ×2 IMPLANT
SYRINGE 10CC LL (SYRINGE) ×4 IMPLANT
UNDERPAD 30X30 (UNDERPADS AND DIAPERS) ×4 IMPLANT
WATER STERILE IRR 500ML POUR (IV SOLUTION) ×2 IMPLANT
selectSeed I-125 ×146 IMPLANT

## 2017-05-26 NOTE — Op Note (Addendum)
PRE-OPERATIVE DIAGNOSIS:  Adenocarcinoma of the prostate  POST-OPERATIVE DIAGNOSIS:  Same  PROCEDURE:  Procedure(s): 1. I-125 radioactive seed implantation 2. Cystoscopy 3. Placement of SpaceOAR  SURGEON:  Surgeon(s): [name], MD  Radiation oncologist: Tyler Pita, MD  ANESTHESIA:  General  EBL:  Minimal  DRAINS: 1 French Foley catheter  INDICATION: Paul Moon is a 72 year old with a history of T1c prostate cancer. After discussing treatment options he has elected to proceed with brachytherapy  Description of procedure: After informed consent the patient was brought to the major OR, placed on the table and administered general anesthesia. He was then moved to the modified lithotomy position with his perineum perpendicular to the floor. His perineum and genitalia were then sterilely prepped. An official timeout was then performed. A 16 French Foley catheter was then placed in the bladder and filled with dilute contrast, a rectal tube was placed in the rectum and the transrectal ultrasound probe was placed in the rectum and affixed to the stand. He was then sterilely draped.  Real time ultrasonography was used along with the seed planning software Oncentra Prostate vs. 4.2.21. This was used to develop the seed plan including the number of needles as well as number of seeds required for complete and adequate coverage. Real-time ultrasonography was then used along with the previously developed plan and the Nucletron device to implant a total of 73 seeds using 21 needles. This proceeded without difficulty or complication.  A Foley catheter was then removed as well as the transrectal ultrasound probe and rectal probe. Flexible cystoscopy was then performed using the 17 French flexible scope which revealed a normal urethra throughout its length down to the sphincter which appeared intact. The prostatic urethra revealed bilobar hypertrophy but no evidence of obstruction, seeds,  spacers or lesions. The bladder was then entered and fully and systematically inspected. The ureteral orifices were noted to be of normal configuration and position. The mucosa revealed no evidence of tumors. There were also no stones identified within the bladder. I noted no seeds or spacers on the floor of the bladder and retroflexion of the scope revealed no seeds protruding from the base of the prostate.  The cystoscope was then removed and a new 38 French Foley catheter was then inserted and the balloon was filled with 10 cc of sterile water. This was connected to closed system drainage and the patient was awakened and taken to recovery room in stable and satisfactory condition. He tolerated procedure well and there were no intraoperative complications.

## 2017-05-26 NOTE — Anesthesia Postprocedure Evaluation (Signed)
Anesthesia Post Note  Patient: Paul Moon  Procedure(s) Performed: RADIOACTIVE SEED IMPLANT/BRACHYTHERAPY IMPLANT (N/A Prostate) SPACE OAR INSTILLATION (N/A Prostate)     Patient location during evaluation: PACU Anesthesia Type: General Level of consciousness: awake and alert Pain management: pain level controlled Vital Signs Assessment: post-procedure vital signs reviewed and stable Respiratory status: spontaneous breathing, nonlabored ventilation, respiratory function stable and patient connected to nasal cannula oxygen Cardiovascular status: blood pressure returned to baseline and stable Postop Assessment: no apparent nausea or vomiting Anesthetic complications: no    Last Vitals:  Vitals:   05/26/17 1545 05/26/17 1621  BP:  (!) 151/73  Pulse: 76 79  Resp: 17 16  Temp:  36.8 C  SpO2: 95% 95%    Last Pain:  Vitals:   05/26/17 1541  TempSrc:   PainSc: 0-No pain                 Clarece Drzewiecki S

## 2017-05-26 NOTE — Transfer of Care (Signed)
  Last Vitals:  Vitals Value Taken Time  BP 117/71 05/26/2017  2:41 PM  Temp    Pulse 81 05/26/2017  2:44 PM  Resp 17 05/26/2017  2:44 PM  SpO2 97 % 05/26/2017  2:44 PM  Vitals shown include unvalidated device data.  Last Pain:  Vitals:   05/26/17 1129  TempSrc:   PainSc: 1       Patients Stated Pain Goal: 5 (05/26/17 1129)  Immediate Anesthesia Transfer of Care Note  Patient: Paul Moon  Procedure(s) Performed: Procedure(s) (LRB): RADIOACTIVE SEED IMPLANT/BRACHYTHERAPY IMPLANT (N/A) SPACE OAR INSTILLATION (N/A)  Patient Location: PACU  Anesthesia Type: General  Level of Consciousness: awake, alert  and oriented  Airway & Oxygen Therapy: Patient Spontanous Breathing and Patient connected to nasal cannula oxygen  Post-op Assessment: Report given to PACU RN and Post -op Vital signs reviewed and stable  Post vital signs: Reviewed and stable  Complications: No apparent anesthesia complications

## 2017-05-26 NOTE — Progress Notes (Signed)
  Radiation Oncology         [phone] ________________________________  Name: Paul Moon MRN: [id number]  Date: 05/26/2017  DOB: 1945-10-06       Prostate Seed Implant  CC:Kim, Jeneen Rinks, MD  No ref. provider found  DIAGNOSIS: 72 y.o. gentleman with Stage T1c adenocarcinoma of the prostate with Gleason Score of 4+3, and PSA of 5.49.  PROCEDURE: Insertion of radioactive I-125 seeds into the prostate gland.  RADIATION DOSE: 145 Gy, definitive therapy.  TECHNIQUE: Paul Moon was brought to the operating room with the urologist. He was placed in the dorsolithotomy position. He was catheterized and a rectal tube was inserted. The perineum was shaved, prepped and draped. The ultrasound probe was then introduced into the rectum to see the prostate gland.  TREATMENT DEVICE: A needle grid was attached to the ultrasound probe stand and anchor needles were placed.  3D PLANNING: The prostate was imaged in 3D using a sagittal sweep of the prostate probe. These images were transferred to the planning computer. There, the prostate, urethra and rectum were defined on each axial reconstructed image. Then, the software created an optimized 3D plan and a few seed positions were adjusted. The quality of the plan was reviewed using [facility] information for the target and the following two organs at risk:  Urethra and Rectum.  Then the accepted plan was uploaded to the seed Selectron afterloading unit.  PROSTATE VOLUME STUDY:  Using transrectal ultrasound the volume of the prostate was verified to be 52.2 cc.  SPECIAL TREATMENT PROCEDURE/SUPERVISION AND HANDLING: The Nucletron FIRST system was used to place the needles under sagittal guidance. A total of 21 needles were used to deposit 73 seeds in the prostate gland. The individual seed activity was 0.521 mCi.  SpaceOAR:  Yes  COMPLEX SIMULATION: At the end of the procedure, an anterior radiograph of the pelvis was obtained to document seed positioning and  count. Cystoscopy was performed to check the urethra and bladder.  MICRODOSIMETRY: At the end of the procedure, the patient was emitting 0.12 mR/hr at 1 meter. Accordingly, he was considered safe for hospital discharge.  PLAN: The patient will return to the radiation oncology clinic for post implant CT dosimetry in three weeks.   ________________________________  Sheral Apley Tammi Klippel, M.D.

## 2017-05-26 NOTE — H&P (Signed)
Urology Admission H&P  Chief Complaint: prostate cancer  History of Present Illness: [name] is a 72yo with a h xof T1c intermediate risk prostate cancer here for definitive therapy,. No significant LUTS.  Past Medical History:  Diagnosis Date  . Arthritis    INDEX FINGER JOINT LEFT HAND, right knee  . Bipolar disorder ([place])   . Depression   . History of gout 2018   right hand 2 fingers and right wrist-- 05-20-2017 per pt resolved  . Hyperplasia of prostate with lower urinary tract symptoms (LUTS)   . Hypertension   . OSA on CPAP    per study 03-22-2013  moderate OSA w/ AHI 26.8/hr  . Pinched cervical nerve root    PT STATES C 7-8 PINCHED NERVE CAUSING NUMBNESS MIDDLE, RING AND LITTLE FINGER LEFT HAND - NO PAIN - STATES HE IS TRYING TO AVOID SURGERY.  . Pre-diabetes    followed by pcp  . Prostate cancer [facility]) urologist-  dr Alyson Ingles  oncologist-  dr Tammi Klippel   dx 05-24-2016  Stage T1c, Gleason 4+3, PSA 5.49, vol 65.5cc/  pt seek second opinion's at [facility] and [facility] not candidate for focal laser ablation/  scheduled for radiactive seed implants 05-26-2017  . Wears glasses    Past Surgical History:  Procedure Laterality Date  . CIRCUMCISION  1973 approx.  Marland Kitchen KNEE ARTHROSCOPY Left 04/28/2013   Procedure: LEFT ARTHROSCOPY KNEE WITH DEBRIDEMENT meniscal debridement;  Surgeon: Gearlean Alf, MD;  Location: [facility];  Service: Orthopedics;  Laterality: Left;  . KNEE SURGERY Right 2008  . MR GUIDED PROSTATE BIOPSY  12-12-2016    [facility], [place])   w/ general anesthesia  . PROSTATE BIOPSY  05-29-2016   dr Alyson Ingles office  . TONSILLECTOMY  1970s    Home Medications:  Current Facility-Administered Medications  Medication Dose Route Frequency Provider Last Rate Last Dose  . ciprofloxacin (CIPRO) IVPB 400 mg  400 mg Intravenous 60 min Pre-Op Cleon Gustin, MD      . lactated ringers infusion   Intravenous Continuous Janeece Riggers, MD 50 mL/hr at 05/26/17 1154    .  [START ON 05/27/2017] sodium phosphate (FLEET) 7-19 GM/118ML enema 1 enema  1 enema Rectal Once Jayon Matton, Candee Furbish, MD       Allergies:  Allergies  Allergen Reactions  . Hctz [Hydrochlorothiazide]     Unknown reaction  . Lactose Intolerance (Gi)   . Lisinopril Cough  . Risperdal [Risperidone] Other (See Comments)    "not be lucid, be foggy"  . Seroquel [Quetiapine Fumarate] Other (See Comments)    Joint swelling    Family History  Problem Relation Age of Onset  . Renal cancer Father   . Heart attack Paternal Grandmother   . Heart attack Paternal Grandfather   . Cancer Paternal Aunt        breast   Social History:  reports that he quit smoking about 32 years ago. His smoking use included cigarettes. He has a 30.00 pack-year smoking history. He has never used smokeless tobacco. He reports that he drinks alcohol. He reports that he does not use drugs.  Review of Systems  All other systems reviewed and are negative.   Physical Exam:  Vital signs in last 24 hours: Temp:  [97.7 F (36.5 C)] 97.7 F (36.5 C) (03/25 1052) Pulse Rate:  [83] 83 (03/25 1052) Resp:  [16] 16 (03/25 1052) BP: (143)/(78) 143/78 (03/25 1052) SpO2:  [98 %] 98 % (03/25 1052) Weight:  [  98.6 kg (217 lb 4.8 oz)] 98.6 kg (217 lb 4.8 oz) (03/25 1052) Physical Exam  Constitutional: He is oriented to person, place, and time. He appears well-developed and well-nourished.  HENT:  Head: Normocephalic and atraumatic.  Eyes: Pupils are equal, round, and reactive to light. EOM are normal.  Neck: Normal range of motion. No thyromegaly present.  Cardiovascular: Normal rate and regular rhythm.  Respiratory: Effort normal. No respiratory distress.  GI: Soft. He exhibits no distension.  Musculoskeletal: Normal range of motion. He exhibits no edema.  Neurological: He is alert and oriented to person, place, and time.  Skin: Skin is warm and dry.  Psychiatric: He has a normal mood and affect. His behavior is normal.  Judgment and thought content normal.    Laboratory Data:  No results found for this or any previous visit (from the past 24 hour(s)). No results found for this or any previous visit (from the past 240 hour(s)). Creatinine: No results for input(s): CREATININE in the last 168 hours. Baseline Creatinine: unknwon  Impression/Assessment:  71yo with T1c prostate cancer  Plan:  The risks/benefits/alternatives to brachytherapy and SpaceOAR was explained to the patient and he understands and wishes to proceed with surgery  [name] 05/26/2017, 12:31 PM

## 2017-05-26 NOTE — Anesthesia Procedure Notes (Signed)
Procedure Name: LMA Insertion Date/Time: 05/26/2017 12:57 PM Performed by: Janeece Riggers, MD Pre-anesthesia Checklist: Patient identified, Emergency Drugs available, Suction available and Patient being monitored Patient Re-evaluated:Patient Re-evaluated prior to induction Oxygen Delivery Method: Circle system utilized Preoxygenation: Pre-oxygenation with 100% oxygen Induction Type: IV induction Ventilation: Mask ventilation without difficulty LMA: LMA inserted LMA Size: 5.0 Number of attempts: 1 Airway Equipment and Method: Bite block Placement Confirmation: positive ETCO2 Tube secured with: Tape Dental Injury: Teeth and Oropharynx as per pre-operative assessment

## 2017-05-26 NOTE — Anesthesia Preprocedure Evaluation (Signed)
Anesthesia Evaluation  Patient identified by MRN, date of birth, ID band Patient awake    Reviewed: Allergy & Precautions, H&P , NPO status , Patient's Chart, lab work & pertinent test results  Airway Mallampati: II  TM Distance: >3 FB Neck ROM: Full    Dental no notable dental hx.    Pulmonary sleep apnea and Continuous Positive Airway Pressure Ventilation , pneumonia, resolved, former smoker,    Pulmonary exam normal breath sounds clear to auscultation       Cardiovascular hypertension, Pt. on medications Normal cardiovascular exam Rhythm:Regular Rate:Normal     Neuro/Psych  Headaches, PSYCHIATRIC DISORDERS Bipolar Disorder  Neuromuscular disease    GI/Hepatic negative GI ROS, Neg liver ROS,   Endo/Other  negative endocrine ROS  Renal/GU negative Renal ROS  negative genitourinary   Musculoskeletal  (+) Arthritis , Osteoarthritis,    Abdominal (+) + obese,   Peds negative pediatric ROS (+)  Hematology negative hematology ROS (+)   Anesthesia Other Findings   Reproductive/Obstetrics negative OB ROS                             Anesthesia Physical  Anesthesia Plan  ASA: II  Anesthesia Plan: General   Post-op Pain Management:    Induction: Intravenous  PONV Risk Score and Plan:   Airway Management Planned: LMA  Additional Equipment:   Intra-op Plan:   Post-operative Plan: Extubation in OR  Informed Consent: I have reviewed the patients History and Physical, chart, labs and discussed the procedure including the risks, benefits and alternatives for the proposed anesthesia with the patient or authorized representative who has indicated his/her understanding and acceptance.   Dental advisory given  Plan Discussed with: CRNA  Anesthesia Plan Comments:         Anesthesia Quick Evaluation

## 2017-05-26 NOTE — Discharge Instructions (Signed)

## 2017-05-27 ENCOUNTER — Encounter (HOSPITAL_BASED_OUTPATIENT_CLINIC_OR_DEPARTMENT_OTHER): Payer: Self-pay | Admitting: Urology

## 2017-06-05 DIAGNOSIS — G4733 Obstructive sleep apnea (adult) (pediatric): Secondary | ICD-10-CM | POA: Diagnosis not present

## 2017-06-05 DIAGNOSIS — R7309 Other abnormal glucose: Secondary | ICD-10-CM | POA: Diagnosis not present

## 2017-06-05 DIAGNOSIS — I10 Essential (primary) hypertension: Secondary | ICD-10-CM | POA: Diagnosis not present

## 2017-06-05 DIAGNOSIS — R413 Other amnesia: Secondary | ICD-10-CM | POA: Diagnosis not present

## 2017-06-09 DIAGNOSIS — C61 Malignant neoplasm of prostate: Secondary | ICD-10-CM | POA: Diagnosis not present

## 2017-06-11 ENCOUNTER — Telehealth: Payer: Self-pay | Admitting: *Deleted

## 2017-06-11 NOTE — Telephone Encounter (Signed)
CALLED PATIENT TO REMIND OF POST SEED APPTS. FOR 06-12-17 AND HIS MRI ON 06-13-17, LVM FOR A RETURN CALL

## 2017-06-12 ENCOUNTER — Ambulatory Visit (HOSPITAL_COMMUNITY)
Admission: EM | Admit: 2017-06-12 | Discharge: 2017-06-12 | Disposition: A | Discharge status: Home / Self Care | Payer: Medicare Other | Attending: Internal Medicine | Admitting: Internal Medicine

## 2017-06-12 ENCOUNTER — Encounter: Payer: Self-pay | Admitting: Medical Oncology

## 2017-06-12 ENCOUNTER — Ambulatory Visit
Admission: RE | Admit: 2017-06-12 | Discharge: 2017-06-12 | Disposition: A | Discharge status: Home / Self Care | Payer: Medicare Other | Source: Ambulatory Visit | Attending: Radiation Oncology | Admitting: Radiation Oncology

## 2017-06-12 ENCOUNTER — Encounter (HOSPITAL_COMMUNITY): Payer: Self-pay | Admitting: Emergency Medicine

## 2017-06-12 ENCOUNTER — Other Ambulatory Visit: Payer: Self-pay

## 2017-06-12 ENCOUNTER — Encounter: Payer: Self-pay | Admitting: Radiation Oncology

## 2017-06-12 VITALS — BP 154/77 | HR 105 | Temp 99.1°F | Resp 18 | Wt 215.0 lb

## 2017-06-12 DIAGNOSIS — J069 Acute upper respiratory infection, unspecified: Secondary | ICD-10-CM | POA: Diagnosis not present

## 2017-06-12 DIAGNOSIS — C61 Malignant neoplasm of prostate: Secondary | ICD-10-CM | POA: Insufficient documentation

## 2017-06-12 DIAGNOSIS — Z923 Personal history of irradiation: Secondary | ICD-10-CM | POA: Insufficient documentation

## 2017-06-12 DIAGNOSIS — Z888 Allergy status to other drugs, medicaments and biological substances status: Secondary | ICD-10-CM | POA: Insufficient documentation

## 2017-06-12 DIAGNOSIS — Z510 Encounter for antineoplastic radiation therapy: Secondary | ICD-10-CM | POA: Diagnosis not present

## 2017-06-12 DIAGNOSIS — Z79899 Other long term (current) drug therapy: Secondary | ICD-10-CM | POA: Insufficient documentation

## 2017-06-12 MED ORDER — AZITHROMYCIN 250 MG PO TABS: ORAL_TABLET | ORAL | 0 refills | Status: AC

## 2017-06-12 NOTE — ED Provider Notes (Signed)
[place]    CSN: [id number] Arrival date & time: 06/12/17  1307     History   Chief Complaint Chief Complaint  Patient presents with  . URI    HPI Paul Moon is a 72 y.o. male.   Paul Moon presents with complaints of 4 days of productive cough, shortness of breath , nasal drainage, sore throat, headache. Low grade temps, yesterday 100.3, today 99.1. Still with headache although maybe some mild improvement in symptoms today. Denies gi/gu symptoms. States his S/O was treated with azithromycin recently for cough symptoms. No other known ill contacts. Has been taking tylenol, last immediately prior to arrival to clinic, mucinex dm as well as allergy medication for symptoms which have not helped. Hx gout, htn, osa, prostate cancer.    ROS per HPI.      Past Medical History:  Diagnosis Date  . Arthritis    INDEX FINGER JOINT LEFT HAND, right knee  . Bipolar disorder ([place])   . Depression   . History of gout 2018   right hand 2 fingers and right wrist-- 05-20-2017 per pt resolved  . Hyperplasia of prostate with lower urinary tract symptoms (LUTS)   . Hypertension   . OSA on CPAP    per study 03-22-2013  moderate OSA w/ AHI 26.8/hr  . Pinched cervical nerve root    PT STATES C 7-8 PINCHED NERVE CAUSING NUMBNESS MIDDLE, RING AND LITTLE FINGER LEFT HAND - NO PAIN - STATES HE IS TRYING TO AVOID SURGERY.  . Pre-diabetes    followed by pcp  . Prostate cancer [facility]) urologist-  dr Alyson Ingles  oncologist-  dr Tammi Klippel   dx 05-24-2016  Stage T1c, Gleason 4+3, PSA 5.49, vol 65.5cc/  pt seek second opinion's at [facility] and [facility] not candidate for focal laser ablation/  scheduled for radiactive seed implants 05-26-2017  . Wears glasses     Patient Active Problem List   Diagnosis Date Noted  . Prostate cancer ([place]) 06/18/2016  . Meralgia paresthetica of right side 02/18/2014  . Acute medial meniscal tear 04/27/2013  . Obstructive sleep apnea 04/09/2013  . Tremor  03/22/2013  . Headache(784.0) 03/22/2013  . Cervical radiculopathy 07/10/2011    Past Surgical History:  Procedure Laterality Date  . CIRCUMCISION  1973 approx.  Marland Kitchen KNEE ARTHROSCOPY Left 04/28/2013   Procedure: LEFT ARTHROSCOPY KNEE WITH DEBRIDEMENT meniscal debridement;  Surgeon: Gearlean Alf, MD;  Location: [facility];  Service: Orthopedics;  Laterality: Left;  . KNEE SURGERY Right 2008  . MR GUIDED PROSTATE BIOPSY  12-12-2016    [facility], [place])   w/ general anesthesia  . PROSTATE BIOPSY  05-29-2016   dr Alyson Ingles office  . RADIOACTIVE SEED IMPLANT N/A 05/26/2017   Procedure: RADIOACTIVE SEED IMPLANT/BRACHYTHERAPY IMPLANT;  Surgeon: Cleon Gustin, MD;  Location: [facility];  Service: Urology;  Laterality: N/A;  . SPACE OAR INSTILLATION N/A 05/26/2017   Procedure: SPACE OAR INSTILLATION;  Surgeon: Cleon Gustin, MD;  Location: [facility];  Service: Urology;  Laterality: N/A;  . TONSILLECTOMY  1970s       Home Medications    Prior to Admission medications   Medication Sig Start Date End Date Taking? Authorizing Provider  cholecalciferol (VITAMIN D) 1000 UNITS tablet Take 1,000 Units by mouth daily.   Yes [provider]  finasteride (PROSCAR) 5 MG tablet Take 5 mg by mouth every morning.    Yes [provider]  losartan (COZAAR) 100 MG  tablet Take 100 mg by mouth at bedtime.    Yes [provider]  Mirabegron (MYRBETRIQ PO) Take by mouth.   Yes [provider]  sildenafil (REVATIO) 20 MG tablet Take 20 mg by mouth 3 (three) times daily as needed (ed). Reports taking 40-60 mg as needed.    Yes [provider]  azithromycin (ZITHROMAX) 250 MG tablet Take 2 tablets (500 mg total) by mouth daily for 1 day, THEN 1 tablet (250 mg total) daily for 4 days. 06/12/17 06/17/17  Zigmund Gottron, NP  GARLIC PO Take 1 capsule by mouth daily.     [provider]  HYDROcodone-acetaminophen  (NORCO) 5-325 MG tablet Take 1 tablet by mouth every 4 (four) hours as needed for moderate pain. Patient not taking: Reported on 06/12/2017 05/26/17   Cleon Gustin, MD  Omega-3 Fatty Acids (FISH OIL) 1200 MG CAPS Take 1,200 mg by mouth daily.    [provider]    Family History Family History  Problem Relation Age of Onset  . Renal cancer Father   . Heart attack Paternal Grandmother   . Heart attack Paternal Grandfather   . Cancer Paternal Aunt        breast    Social History Social History   Tobacco Use  . Smoking status: Former Smoker    Packs/day: 1.50    Years: 20.00    Pack years: 30.00    Types: Cigarettes    Last attempt to quit: 05/26/1985    Years since quitting: 32.0  . Smokeless tobacco: Never Used  Substance Use Topics  . Alcohol use: Yes    Comment: occasional wine  . Drug use: No     Allergies   Hctz [hydrochlorothiazide]; Lactose intolerance (gi); Lisinopril; Risperdal [risperidone]; and Seroquel [quetiapine fumarate]   Review of Systems Review of Systems   Physical Exam Triage Vital Signs ED Triage Vitals [06/12/17 1406]  Enc Vitals Group     BP (!) 168/79     Pulse Rate 84     Resp 16     Temp 98.5 F (36.9 C)     Temp src      SpO2 98 %     Weight      Height      Head Circumference      Peak Flow      Pain Score      Pain Loc      Pain Edu?      Excl. in [place]?    No data found.  Updated Vital Signs BP (!) 168/79   Pulse 84   Temp 98.5 F (36.9 C)   Resp 16   SpO2 98%   Visual Acuity Right Eye Distance:   Left Eye Distance:   Bilateral Distance:    Right Eye Near:   Left Eye Near:    Bilateral Near:     Physical Exam  Constitutional: He is oriented to person, place, and time. He appears well-developed and well-nourished.  HENT:  Head: Normocephalic and atraumatic.  Right Ear: Tympanic membrane, external ear and ear canal normal.  Left Ear: Tympanic membrane, external ear and ear canal normal.  Nose:  Rhinorrhea present. Right sinus exhibits no maxillary sinus tenderness and no frontal sinus tenderness. Left sinus exhibits no maxillary sinus tenderness and no frontal sinus tenderness.  Mouth/Throat: Uvula is midline, oropharynx is clear and moist and mucous membranes are normal.  Eyes: Pupils are equal, round, and reactive to light. Conjunctivae are  normal.  Neck: Normal range of motion.  Cardiovascular: Normal rate and regular rhythm.  Pulmonary/Chest: Effort normal. He has decreased breath sounds in the right lower field and the left lower field.  Occasional cough noted with slight decrease to lower lung sounds on exam  Lymphadenopathy:    He has no cervical adenopathy.  Neurological: He is alert and oriented to person, place, and time.  Skin: Skin is warm and dry.  Vitals reviewed.    UC Treatments / Results  Labs (all labs ordered are listed, but only abnormal results are displayed) Labs Reviewed - No data to display  EKG None Radiology No results found.  Procedures Procedures (including critical care time)  Medications Ordered in UC Medications - No data to display   Initial Impression / Assessment and Plan / UC Course  I have reviewed the triage vital signs and the nursing notes.  Pertinent labs & imaging results that were available during my care of the patient were reviewed by me and considered in my medical decision making (see chart for details).     Afebrile. Without hypoxia, tachypnea or tachypnea. Opted to initiate course of antibiotics for persistent productive cough, azithromycin provided. To continue with OTC treatments as needed. Discussed with patient that possibly viral in nature. No matter would expect improvement in the next week. Return precautions provided. Patient verbalized understanding and agreeable to plan.    Final Clinical Impressions(s) / UC Diagnoses   Final diagnoses:  Upper respiratory tract infection, unspecified type    ED  Discharge Orders        Ordered    azithromycin (ZITHROMAX) 250 MG tablet     06/12/17 1501       Controlled Substance Prescriptions [place] Controlled Substance Registry consulted? Not Applicable   Zigmund Gottron, NP 06/12/17 1506

## 2017-06-12 NOTE — Progress Notes (Signed)
  Radiation Oncology         [phone] ________________________________  Name: JAHKING LESSER MRN: [id number]  Date: 06/12/2017  DOB: [birth year]  COMPLEX SIMULATION NOTE  NARRATIVE:  The patient was brought to the [facility] today following prostate seed implantation approximately one month ago.  Identity was confirmed.  All relevant records and images related to the planned course of therapy were reviewed.  Then, the patient was set-up supine.  CT images were obtained.  The CT images were loaded into the planning software.  Then the prostate and rectum were contoured.  Treatment planning then occurred.  The implanted iodine 125 seeds were identified by the physics staff for projection of radiation distribution  I have requested : 3D Simulation  I have requested a DVH of the following structures: Prostate and rectum.    ________________________________  Sheral Apley Tammi Klippel, M.D.  This document serves as a record of services personally performed by Tyler Pita, MD. It was created on his behalf by Rae Lips, a trained medical scribe. The creation of this record is based on the scribe's personal observations and the provider's statements to them. This document has been checked and approved by the attending provider.

## 2017-06-12 NOTE — ED Triage Notes (Signed)
Pt c/o allergies, 100.3 temp yesterday morning, pt states he cant breathe well due to congestion, coughing x3-4 days

## 2017-06-12 NOTE — Addendum Note (Signed)
Encounter addended by: Heywood Footman, RN on: 06/12/2017 3:14 PM  Actions taken: Charge Capture section accepted

## 2017-06-12 NOTE — Discharge Instructions (Signed)
Push fluids to ensure adequate hydration and keep secretions thin.  Tylenol and/or ibuprofen as needed for pain or fevers.  Continue with over the counter treatments for symptoms such as allergy medications. Complete course of antibiotics.  Please schedule a recheck with primary care provider in the next week. If symptoms worsen or do not improve in the next week to return to be seen or go to ER

## 2017-06-12 NOTE — Progress Notes (Signed)
Radiation Oncology         [phone] ________________________________  Name: Paul Moon MRN: [id number]  Date: 06/12/2017  DOB: [birth year]  Post-Seed Follow-Up Visit Note  CC: Jani Gravel, MD  Cleon Gustin, MD  Diagnosis:   72 y.o. male with Stage T1c adenocarcinoma of the prostate with Gleason Score of 4+3, and PSA of5.49  No diagnosis found.  Interval Since Last Radiation:  2.5 weeks - Insertion of radioactive I-125 seeds into the prostate gland, 145 Gy definitive therapy, with SpaceOAR  Narrative:  The patient returns today for routine follow-up.  He is complaining of increased urinary frequency and urinary hesitation symptoms.  He reports a weaker stream but feels that he is emptying his bladder completely with voiding and does not feel the need to strain.  He filled out a questionnaire regarding urinary function today providing and overall IPSS score of 17 characterizing his symptoms as moderate.  His pre-implant score was 10. He was recently seen with Dr. Alyson Ingles and given samples of Myrbetriq which he started yesterday.  He specifically denies dysuria, gross hematuria or incontinence. He has noticed increased frequency of bowel movements but denies outright diarrhea.  He denies recent fevers, chills or night sweats.  Overall he is pleased with his progress.  ALLERGIES:  is allergic to hctz [hydrochlorothiazide]; lactose intolerance (gi); lisinopril; risperdal [risperidone]; and seroquel [quetiapine fumarate].  Meds: Current Outpatient Medications  Medication Sig Dispense Refill  . cholecalciferol (VITAMIN D) 1000 UNITS tablet Take 1,000 Units by mouth daily.    . finasteride (PROSCAR) 5 MG tablet Take 5 mg by mouth every morning.     Marland Kitchen GARLIC PO Take 1 capsule by mouth daily.     Marland Kitchen losartan (COZAAR) 100 MG tablet Take 100 mg by mouth at bedtime.     . Mirabegron (MYRBETRIQ PO) Take by mouth.    . Omega-3 Fatty Acids (FISH OIL) 1200 MG CAPS Take 1,200 mg by mouth  daily.    . sildenafil (REVATIO) 20 MG tablet Take 20 mg by mouth 3 (three) times daily as needed (ed). Reports taking 40-60 mg as needed.     Marland Kitchen HYDROcodone-acetaminophen (NORCO) 5-325 MG tablet Take 1 tablet by mouth every 4 (four) hours as needed for moderate pain. (Patient not taking: Reported on 06/12/2017) 30 tablet 0   No current facility-administered medications for this encounter.     Physical Findings: In general this is a well appearing African-American male in no acute distress. He's alert and oriented x4 and appropriate throughout the examination. Cardiopulmonary assessment is negative for acute distress and he exhibits normal effort.   Lab Findings: Lab Results  Component Value Date   WBC 3.6 (L) 05/19/2017   HGB 13.7 05/19/2017   HCT 41.6 05/19/2017   MCV 94.5 05/19/2017   PLT 288 05/19/2017    Radiographic Findings:  Patient underwent CT imaging in our clinic for post implant dosimetry. The CT was reviewed by Dr. Tammi Klippel and appears to demonstrate an adequate distribution of radioactive seeds throughout the prostate gland. There are no seeds in or near the rectum.  He is scheduled for prostate MRI on Friday, April 12 at 8:30 PM.  These images will be fused with the CT images for further evaluation.  We suspect the final radiation plan and dosimetry will show appropriate coverage of the prostate gland.   Impression/Plan: The patient is recovering from the effects of radiation. His urinary symptoms should gradually improve over the next 4-6 months. We talked  about this today. He is encouraged by his improvement already and is otherwise pleased with his outcome. We also talked about long-term follow-up for prostate cancer following seed implant. He understands that ongoing PSA determinations and digital rectal exams will help perform surveillance to rule out disease recurrence. He has a follow up appointment scheduled with [facility] in 3 months. He understands what to expect with his  PSA measures. Patient was also educated today about some of the long-term effects from radiation including a small risk for rectal bleeding and possibly erectile dysfunction. We talked about some of the general management approaches to these potential complications. However, I did encourage the patient to contact our office or return at any point if he has questions or concerns related to his previous radiation and prostate cancer.    Nicholos Johns, PA-C    Tyler Pita, MD  [facility] Direct Dial: [phone]  Fax: [phone] [email or web address]  Skype  LinkedIn    Page Me   This document serves as a record of services personally performed by Tyler Pita, MD and Freeman Caldron, PA-C. It was created on their behalf by Rae Lips, a trained medical scribe. The creation of this record is based on the scribe's personal observations and the providers' statements to them. This document has been checked and approved by the attending providers.

## 2017-06-12 NOTE — Progress Notes (Signed)
Weight and vitals stable. Reports sinus headache and pressure associated with allergies is causing pain and discomfort. Reports occasional sharp pain in the urethra. Pre seed IPSS 10. Post seed IPSS 17. Denies dysuria, hematuria, leakage or incontinence. Reports scant sediment in urine. Evaluated by Dr. Alyson Ingles on Monday and provided with samples of Myrbetriq.  BP (!) 154/77 (BP Location: Left Arm, Patient Position: Sitting, Cuff Size: Normal)   Pulse (!) 105   Temp 99.1 F (37.3 C) (Oral)   Resp 18   Wt 215 lb (97.5 kg)   SpO2 97%   BMI 32.69 kg/m  Wt Readings from Last 3 Encounters:  06/12/17 215 lb (97.5 kg)  05/26/17 217 lb 4.8 oz (98.6 kg)  02/05/17 218 lb 12.8 oz (99.2 kg)

## 2017-06-13 ENCOUNTER — Encounter (HOSPITAL_COMMUNITY): Payer: Self-pay

## 2017-06-13 ENCOUNTER — Ambulatory Visit (HOSPITAL_COMMUNITY)
Admission: RE | Admit: 2017-06-13 | Discharge: 2017-06-13 | Disposition: A | Discharge status: Home / Self Care | Payer: Medicare Other | Source: Ambulatory Visit | Attending: Urology | Admitting: Urology

## 2017-06-13 DIAGNOSIS — R05 Cough: Secondary | ICD-10-CM | POA: Diagnosis not present

## 2017-06-13 DIAGNOSIS — C61 Malignant neoplasm of prostate: Secondary | ICD-10-CM | POA: Diagnosis not present

## 2017-06-13 DIAGNOSIS — I10 Essential (primary) hypertension: Secondary | ICD-10-CM | POA: Diagnosis not present

## 2017-06-13 DIAGNOSIS — J329 Chronic sinusitis, unspecified: Secondary | ICD-10-CM | POA: Diagnosis not present

## 2017-06-23 ENCOUNTER — Encounter: Payer: Self-pay | Admitting: Radiation Oncology

## 2017-06-23 DIAGNOSIS — C61 Malignant neoplasm of prostate: Secondary | ICD-10-CM | POA: Diagnosis not present

## 2017-06-23 DIAGNOSIS — Z510 Encounter for antineoplastic radiation therapy: Secondary | ICD-10-CM | POA: Diagnosis not present

## 2017-06-23 NOTE — Progress Notes (Signed)
  Radiation Oncology         [phone] ________________________________  Name: Paul Moon MRN: [id number]  Date: 06/23/2017  DOB: 1945/08/10  3D Planning Note   Prostate Brachytherapy Post-Implant Dosimetry  Diagnosis: 72 y.o. gentleman with Stage T1c adenocarcinoma of the prostate with Gleason Score of 4+3, and PSA of 5.49  Narrative: On a previous date, Paul Moon returned following prostate seed implantation for post implant planning. He underwent CT scan complex simulation to delineate the three-dimensional structures of the pelvis and demonstrate the radiation distribution.  Since that time, the seed localization, and complex isodose planning with dose volume histograms have now been completed.  Results:   Prostate Coverage - The dose of radiation delivered to the 90% or more of the prostate gland (D90) was 104.38% of the prescription dose. This exceeds our goal of greater than 90%. Rectal Sparing - The volume of rectal tissue receiving the prescription dose or higher was 0.01 cc. This falls under our thresholds tolerance of 1.0 cc.  Impression: The prostate seed implant appears to show adequate target coverage and appropriate rectal sparing.  Plan:  The patient will continue to follow with urology for ongoing PSA determinations. I would anticipate a high likelihood for local tumor control with minimal risk for rectal morbidity.  ________________________________  Sheral Apley Tammi Klippel, M.D.

## 2017-07-10 ENCOUNTER — Telehealth: Payer: Self-pay | Admitting: Radiation Oncology

## 2017-07-10 NOTE — Telephone Encounter (Signed)
Received voicemail message from patient requesting a return call. Phoned patient to inquire. Patient questions what the report read from his MRI on 4/12. Explained the MRI confirmed his spaceoar is in its proper location. Then, patient requested a refill of his Mybetriq. Instructed patient to phone urologist (also initial prescriber) for refill. Patient verbalized understanding of all reviewed and expressed appreciation for the call back.

## 2017-08-05 ENCOUNTER — Telehealth: Payer: Self-pay | Admitting: *Deleted

## 2017-08-05 NOTE — Telephone Encounter (Signed)
Returned patient's phone call, lvm 

## 2017-08-07 ENCOUNTER — Telehealth: Payer: Self-pay | Admitting: Neurology

## 2017-08-07 ENCOUNTER — Encounter: Payer: Self-pay | Admitting: Neurology

## 2017-08-07 ENCOUNTER — Ambulatory Visit: Payer: Medicare Other | Admitting: Neurology

## 2017-08-07 VITALS — BP 142/76 | HR 82 | Wt 223.0 lb

## 2017-08-07 DIAGNOSIS — G3184 Mild cognitive impairment, so stated: Secondary | ICD-10-CM | POA: Diagnosis not present

## 2017-08-07 DIAGNOSIS — R413 Other amnesia: Secondary | ICD-10-CM

## 2017-08-07 MED ORDER — DONEPEZIL HCL 10 MG PO TABS: 10.0000 mg | ORAL_TABLET | Freq: Every day | ORAL | 11 refills | Status: DC

## 2017-08-07 MED ORDER — MEMANTINE HCL 10 MG PO TABS: 10.0000 mg | ORAL_TABLET | Freq: Two times a day (BID) | ORAL | 11 refills | Status: DC

## 2017-08-07 NOTE — Progress Notes (Signed)
PATIENT: Paul Moon DOB: January 24, 1946  Chief Complaint  Patient presents with  . New Patient (Initial Visit)    Patient here alone.      HISTORICAL  Paul Moon is a 72 years old male, seen in refer by his primary care physician Dr. Shelia Media, [place] Jew for evaluation of memory loss, initial evaluation was on August 07, 2017, he is alone at today's clinical visit.  I reviewed and summarized the referring note, he had a history of hypertension, prostate hypertrophy, B12, vitamin D deficiency, bipolar disorder, he was treated for couple years, could not tolerate the medication due to side effect,  He was noted to have slight memory loss since 2018, he works as a Forensic psychologist, he tends to overlook details, sometimes let things slide without taking care of a long time, he had prostate surgery in March 2019 under general anesthesia, was noted to have increased memory loss since then, he could not remember that he had put will into place.   He has gained 10 pounds over the last few months, was not able to keep up his exercise routine,  His maternal uncle suffered dementia, maternal grandmother suffered memory loss in her 72s   MRI of brain in 2015 showed mild generalized atrophy, mild supratentorium small vessel disease.  Laboratory evaluation in 2019, normal CMP with exception of mildly elevated glucose 168,Mildly decreased WBC 3.6,  REVIEW OF SYSTEMS: Full 14 system review of systems performed and notable only for incontinence, allergy, memory loss  ALLERGIES: Allergies  Allergen Reactions  . Hctz [Hydrochlorothiazide]     Unknown reaction  . Lactose Intolerance (Gi)   . Lisinopril Cough  . Risperdal [Risperidone] Other (See Comments)    "not be lucid, be foggy"  . Seroquel [Quetiapine Fumarate] Other (See Comments)    Joint swelling    HOME MEDICATIONS: Current Outpatient Medications  Medication Sig Dispense Refill  . cholecalciferol (VITAMIN D) 1000 UNITS tablet Take  1,000 Units by mouth daily.    . finasteride (PROSCAR) 5 MG tablet Take 5 mg by mouth every morning.     Marland Kitchen GARLIC PO Take 1 capsule by mouth daily.     Nyoka Cowden Tea, Camellia sinensis, (GREEN TEA EXTRACT PO) Take by mouth.    . losartan (COZAAR) 100 MG tablet Take 100 mg by mouth at bedtime.     . Mirabegron (MYRBETRIQ PO) Take by mouth.    . Omega-3 Fatty Acids (FISH OIL) 1200 MG CAPS Take 1,200 mg by mouth daily.    . sildenafil (REVATIO) 20 MG tablet Take 20 mg by mouth 3 (three) times daily as needed (ed). Reports taking 40-60 mg as needed.      No current facility-administered medications for this visit.     PAST MEDICAL HISTORY: Past Medical History:  Diagnosis Date  . Arthritis    INDEX FINGER JOINT LEFT HAND, right knee  . Bipolar disorder ([place])   . Depression   . History of gout 2018   right hand 2 fingers and right wrist-- 05-20-2017 per pt resolved  . Hyperplasia of prostate with lower urinary tract symptoms (LUTS)   . Hypertension   . OSA on CPAP    per study 03-22-2013  moderate OSA w/ AHI 26.8/hr  . Pinched cervical nerve root    PT STATES C 7-8 PINCHED NERVE CAUSING NUMBNESS MIDDLE, RING AND LITTLE FINGER LEFT HAND - NO PAIN - STATES HE IS TRYING TO AVOID SURGERY.  . Pre-diabetes  followed by pcp  . Prostate cancer [facility]) urologist-  dr Alyson Ingles  oncologist-  dr Tammi Klippel   dx 05-24-2016  Stage T1c, Gleason 4+3, PSA 5.49, vol 65.5cc/  pt seek [facility] opinion's at [facility] and [facility] not candidate for focal laser ablation/  scheduled for radiactive seed implants 05-26-2017  . Wears glasses     PAST SURGICAL HISTORY: Past Surgical History:  Procedure Laterality Date  . CIRCUMCISION  1973 approx.  Marland Kitchen KNEE ARTHROSCOPY Left 04/28/2013   Procedure: LEFT ARTHROSCOPY KNEE WITH DEBRIDEMENT meniscal debridement;  Surgeon: Gearlean Alf, MD;  Location: [facility];  Service: Orthopedics;  Laterality: Left;  . KNEE SURGERY Right 2008  . MR GUIDED PROSTATE BIOPSY  12-12-2016     [facility] [place]   w/ general anesthesia  . PROSTATE BIOPSY  05-29-2016   dr Alyson Ingles office  . RADIOACTIVE SEED IMPLANT N/A 05/26/2017   Procedure: RADIOACTIVE SEED IMPLANT/BRACHYTHERAPY IMPLANT;  Surgeon: Cleon Gustin, MD;  Location: [facility];  Service: Urology;  Laterality: N/A;  . SPACE OAR INSTILLATION N/A 05/26/2017   Procedure: SPACE OAR INSTILLATION;  Surgeon: Cleon Gustin, MD;  Location: [facility];  Service: Urology;  Laterality: N/A;  . TONSILLECTOMY  1970s    FAMILY HISTORY: Family History  Problem Relation Age of Onset  . Renal cancer Father   . Heart attack Paternal Grandmother   . Heart attack Paternal Grandfather   . Cancer Paternal Aunt        breast    SOCIAL HISTORY:  Social History   Socioeconomic History  . Marital status: Divorced    Spouse name: Not on file  . Number of children: 2  . Years of education: College  . Highest education level: Not on file  Occupational History  . Occupation: Semi-Retired  Social Needs  . Financial resource strain: Not on file  . Food insecurity:    Worry: Not on file    Inability: Not on file  . Transportation needs:    Medical: Not on file    Non-medical: Not on file  Tobacco Use  . Smoking status: Former Smoker    Packs/day: 1.50    Years: 20.00    Pack years: 30.00    Types: Cigarettes    Last attempt to quit: 05/26/1985    Years since quitting: 32.2  . Smokeless tobacco: Never Used  Substance and Sexual Activity  . Alcohol use: Yes    Comment: occasional wine  . Drug use: No  . Sexual activity: Yes  Lifestyle  . Physical activity:    Days per week: Not on file    Minutes per session: Not on file  . Stress: Not on file  Relationships  . Social connections:    Talks on phone: Not on file    Gets together: Not on file    Attends religious service: Not on file    Active member of club or organization: Not on file    Attends meetings of clubs or  organizations: Not on file    Relationship status: Not on file  . Intimate partner violence:    Fear of current or ex partner: Not on file    Emotionally abused: Not on file    Physically abused: Not on file    Forced sexual activity: Not on file  Other Topics Concern  . Not on file  Social History Narrative   Patient lives at home alone.   Caffeine Use: occasionally  PHYSICAL EXAM   Vitals:   08/07/17 0744  BP: (!) 142/76  Pulse: 82  Weight: 223 lb (101.2 kg)    Not recorded      Body mass index is 33.91 kg/m.  PHYSICAL EXAMNIATION:  Gen: NAD, conversant, well nourised, obese, well groomed                     Cardiovascular: Regular rate rhythm, no peripheral edema, warm, nontender. Eyes: Conjunctivae clear without exudates or hemorrhage Neck: Supple, no carotid bruits. Pulmonary: Clear to auscultation bilaterally   NEUROLOGICAL EXAM:  MMSE - Mini Mental State Exam 08/07/2017  Orientation to time 5  Orientation to Place 5  Registration 3  Attention/ Calculation 5  Recall 3  Language- name 2 objects 2  Language- repeat 1  Language- follow 3 step command 3  Language- read & follow direction 1  Write a sentence 1  Copy design 1  Total score 30     CRANIAL NERVES: CN II: Visual fields are full to confrontation. Fundoscopic exam is normal with sharp discs and no vascular changes. Pupils are round equal and briskly reactive to light. CN III, IV, VI: extraocular movement are normal. No ptosis. CN V: Facial sensation is intact to pinprick in all 3 divisions bilaterally. Corneal responses are intact.  CN VII: Face is symmetric with normal eye closure and smile. CN VIII: Hearing is normal to rubbing fingers CN IX, X: Palate elevates symmetrically. Phonation is normal. CN XI: Head turning and shoulder shrug are intact CN XII: Tongue is midline with normal movements and no atrophy.  MOTOR: There is no pronator drift of out-stretched arms. Muscle bulk and tone  are normal. Muscle strength is normal.  REFLEXES: Reflexes are 2+ and symmetric at the biceps, triceps, knees, and ankles. Plantar responses are flexor.  SENSORY: Intact to light touch, pinprick, positional sensation and vibratory sensation are intact in fingers and toes.  COORDINATION: Rapid alternating movements and fine finger movements are intact. There is no dysmetria on finger-to-nose and heel-knee-shin.    GAIT/STANCE: Posture is normal. Gait is steady with normal steps, base, arm swing, and turning. Heel and toe walking are normal. Tandem gait is normal.  Romberg is absent.   DIAGNOSTIC DATA (LABS, IMAGING, TESTING) - I reviewed patient records, labs, notes, testing and imaging myself where available.   ASSESSMENT AND PLAN  Paul Moon is a 72 y.o. male   Mild cognitive impairment  Acute worsening since his general anesthesia in January 2019  He wants to have a repeat MRI of brain without contrast  Get laboratory evaluations record from his primary care physician  Start Namenda 10 mg twice a day, Aricept 10 mg daily  I emphasized the importance of moderate daily exercise  Optimize vascular risk factor control, use CPAP machine     Marcial Pacas, M.D. Ph.D.  [facility] [address], [place], [place] [address] Ph: [phone] Fax: [phone]  CC: Deland Pretty, MD

## 2017-08-07 NOTE — Telephone Encounter (Signed)
[employer] Medicare order sent to [facility]. No auth they will reach out to the pt to schedule.  °

## 2017-08-14 ENCOUNTER — Ambulatory Visit
Admission: RE | Admit: 2017-08-14 | Discharge: 2017-08-14 | Disposition: A | Discharge status: Home / Self Care | Payer: Medicare Other | Source: Ambulatory Visit | Attending: Neurology | Admitting: Neurology

## 2017-08-14 DIAGNOSIS — R413 Other amnesia: Secondary | ICD-10-CM

## 2017-08-15 ENCOUNTER — Telehealth: Payer: Self-pay | Admitting: Neurology

## 2017-08-15 DIAGNOSIS — C61 Malignant neoplasm of prostate: Secondary | ICD-10-CM | POA: Diagnosis not present

## 2017-08-15 NOTE — Telephone Encounter (Signed)
Left patient a detailed message, with results, on voicemail (ok per DPR).  Provided our number to call back with any questions.  

## 2017-08-15 NOTE — Telephone Encounter (Signed)
MRI of brain showed moderate small vessel disease, mild progression compared to previous scan in 2015.  I will review films at his next follow-up visit,  The result was also released to my chart  IMPRESSION: This MRI of the brain without contrast shows the following: 1.    Moderate chronic microvascular ischemic change, mildly progressed when compared to the 03/31/2013 MRI. 2.    Minimal age-appropriate generalized cortical atrophy. 3.    There are no acute findings.

## 2017-09-02 DIAGNOSIS — M2480 Other specific joint derangements of unspecified joint, not elsewhere classified: Secondary | ICD-10-CM | POA: Diagnosis not present

## 2017-09-02 DIAGNOSIS — M79672 Pain in left foot: Secondary | ICD-10-CM | POA: Diagnosis not present

## 2017-09-17 DIAGNOSIS — M722 Plantar fascial fibromatosis: Secondary | ICD-10-CM | POA: Diagnosis not present

## 2017-09-17 DIAGNOSIS — M71572 Other bursitis, not elsewhere classified, left ankle and foot: Secondary | ICD-10-CM | POA: Diagnosis not present

## 2017-09-17 DIAGNOSIS — M79672 Pain in left foot: Secondary | ICD-10-CM | POA: Diagnosis not present

## 2017-09-17 DIAGNOSIS — M7732 Calcaneal spur, left foot: Secondary | ICD-10-CM | POA: Diagnosis not present

## 2017-10-02 DIAGNOSIS — B351 Tinea unguium: Secondary | ICD-10-CM | POA: Diagnosis not present

## 2017-10-02 DIAGNOSIS — M79672 Pain in left foot: Secondary | ICD-10-CM | POA: Diagnosis not present

## 2017-10-02 DIAGNOSIS — M722 Plantar fascial fibromatosis: Secondary | ICD-10-CM | POA: Diagnosis not present

## 2017-10-02 DIAGNOSIS — M71572 Other bursitis, not elsewhere classified, left ankle and foot: Secondary | ICD-10-CM | POA: Diagnosis not present

## 2017-10-07 DIAGNOSIS — Z125 Encounter for screening for malignant neoplasm of prostate: Secondary | ICD-10-CM | POA: Diagnosis not present

## 2017-10-07 DIAGNOSIS — I10 Essential (primary) hypertension: Secondary | ICD-10-CM | POA: Diagnosis not present

## 2017-10-07 DIAGNOSIS — Z0000 Encounter for general adult medical examination without abnormal findings: Secondary | ICD-10-CM | POA: Diagnosis not present

## 2017-10-10 DIAGNOSIS — R739 Hyperglycemia, unspecified: Secondary | ICD-10-CM | POA: Diagnosis not present

## 2017-10-10 DIAGNOSIS — I129 Hypertensive chronic kidney disease with stage 1 through stage 4 chronic kidney disease, or unspecified chronic kidney disease: Secondary | ICD-10-CM | POA: Diagnosis not present

## 2017-10-10 DIAGNOSIS — D709 Neutropenia, unspecified: Secondary | ICD-10-CM | POA: Diagnosis not present

## 2017-10-10 DIAGNOSIS — E559 Vitamin D deficiency, unspecified: Secondary | ICD-10-CM | POA: Diagnosis not present

## 2017-10-10 DIAGNOSIS — I10 Essential (primary) hypertension: Secondary | ICD-10-CM | POA: Diagnosis not present

## 2017-10-10 DIAGNOSIS — I5032 Chronic diastolic (congestive) heart failure: Secondary | ICD-10-CM | POA: Diagnosis not present

## 2017-10-10 DIAGNOSIS — Z0000 Encounter for general adult medical examination without abnormal findings: Secondary | ICD-10-CM | POA: Diagnosis not present

## 2017-10-13 DIAGNOSIS — N3281 Overactive bladder: Secondary | ICD-10-CM | POA: Diagnosis not present

## 2017-10-13 DIAGNOSIS — R350 Frequency of micturition: Secondary | ICD-10-CM | POA: Diagnosis not present

## 2017-10-30 DIAGNOSIS — B351 Tinea unguium: Secondary | ICD-10-CM | POA: Diagnosis not present

## 2017-10-30 DIAGNOSIS — M79674 Pain in right toe(s): Secondary | ICD-10-CM | POA: Diagnosis not present

## 2017-10-30 DIAGNOSIS — Z23 Encounter for immunization: Secondary | ICD-10-CM | POA: Diagnosis not present

## 2017-10-30 DIAGNOSIS — M79675 Pain in left toe(s): Secondary | ICD-10-CM | POA: Diagnosis not present

## 2017-11-10 DIAGNOSIS — R350 Frequency of micturition: Secondary | ICD-10-CM | POA: Diagnosis not present

## 2017-11-18 ENCOUNTER — Ambulatory Visit: Payer: Medicare Other | Admitting: Neurology

## 2017-11-18 ENCOUNTER — Encounter: Payer: Self-pay | Admitting: Neurology

## 2017-11-18 VITALS — BP 148/74 | HR 72 | Ht 68.0 in | Wt 218.4 lb

## 2017-11-18 DIAGNOSIS — G3184 Mild cognitive impairment, so stated: Secondary | ICD-10-CM

## 2017-11-18 MED ORDER — DONEPEZIL HCL 10 MG PO TABS: 10.0000 mg | ORAL_TABLET | Freq: Every day | ORAL | 11 refills | Status: DC

## 2017-11-18 MED ORDER — MEMANTINE HCL 10 MG PO TABS: 10.0000 mg | ORAL_TABLET | Freq: Two times a day (BID) | ORAL | 11 refills | Status: DC

## 2017-11-18 NOTE — Progress Notes (Signed)
PATIENT: Paul Moon DOB: 1945/10/03  Chief Complaint  Patient presents with  . Follow-up    Memory loss follow up room in back hallway patient alone, pt not using a walker or cane for mobility, Pt never took the medications for his memory but does recall dicussing it with Dr. Krista Blue     HISTORICAL  Paul Moon is a 72 years old male, seen in refer by his primary care physician Dr. Shelia Media, [place] Jew for evaluation of memory loss, initial evaluation was on August 07, 2017, he is alone at today's clinical visit.  I reviewed and summarized the referring note, he had a history of hypertension, prostate hypertrophy, B12, vitamin D deficiency, bipolar disorder, he was treated for couple years, could not tolerate the medication due to side effect,  He was noted to have slight memory loss since 2018, he works as a Forensic psychologist, he tends to overlook details, sometimes put things aside not taking care of it a long time, he had prostate surgery in March 2019 under general anesthesia, was noted to have increased memory loss since then, he could not remember that he had put will into place.   He has gained 10 pounds over the last few months, was not able to keep up his exercise routine,  His maternal uncle suffered dementia, maternal grandmother suffered memory loss in her 29s   MRI of brain in 2015 showed mild generalized atrophy, mild supratentorium small vessel disease.  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 Hctz [Hydrochlorothiazide]     Unknown reaction  . Lactose Intolerance (Gi)   . Lisinopril Cough  . Risperdal [Risperidone] Other (See Comments)    "not be lucid, be foggy"  . Seroquel [Quetiapine Fumarate] Other (See Comments)    Joint swelling    HOME MEDICATIONS: Current Outpatient Medications  Medication Sig Dispense Refill  . finasteride (PROSCAR) 5 MG tablet Take 5 mg by mouth every morning.     Marland Kitchen GARLIC PO Take 1 capsule by mouth daily.     Nyoka Cowden Tea, Camellia sinensis, (GREEN TEA EXTRACT PO) Take by mouth.    . losartan (COZAAR) 100 MG tablet Take 100 mg by mouth at bedtime.     . Mirabegron (MYRBETRIQ PO) Take by mouth.    . sildenafil (REVATIO) 20 MG tablet Take 20 mg by mouth 3 (three) times daily as needed (ed). Reports taking 40-60 mg as needed.     . tamsulosin (FLOMAX) 0.4 MG CAPS capsule TK 1 C PO QD  2   No current facility-administered medications for this visit.     PAST MEDICAL HISTORY: Past Medical History:  Diagnosis Date  . Arthritis    INDEX FINGER JOINT LEFT HAND, right knee  . Bipolar disorder ([place])   . Depression   . History of gout 2018   right hand 2  fingers and right wrist-- 05-20-2017 per pt resolved  . Hyperplasia of prostate with lower urinary tract symptoms (LUTS)   . Hypertension   . OSA on CPAP    per study 03-22-2013  moderate OSA w/ AHI 26.8/hr  . Pinched cervical nerve root    PT STATES C 7-8 PINCHED NERVE CAUSING NUMBNESS MIDDLE, RING AND LITTLE FINGER LEFT HAND - NO PAIN - STATES HE IS TRYING TO AVOID SURGERY.  . Pre-diabetes    followed by pcp  . Prostate cancer [facility]) urologist-  dr Alyson Ingles  oncologist-  dr Tammi Klippel   dx 05-24-2016  Stage T1c, Gleason 4+3, PSA 5.49, vol 65.5cc/  pt seek [facility]'s [facility]  not candidate for focal laser ablation/  scheduled for radiactive seed implants 05-26-2017  . Wears glasses     PAST SURGICAL HISTORY: Past Surgical History:  Procedure Laterality Date  . CIRCUMCISION  1973 approx.  Marland Kitchen KNEE ARTHROSCOPY Left 04/28/2013   Procedure: LEFT ARTHROSCOPY KNEE WITH DEBRIDEMENT meniscal debridement;  Surgeon: Gearlean Alf, MD;  Location: [facility];  Service: Orthopedics;  Laterality: Left;  . KNEE SURGERY Right 2008  . MR GUIDED PROSTATE BIOPSY  12-12-2016    [facility] [place]   w/ general anesthesia  . PROSTATE BIOPSY  05-29-2016   dr Alyson Ingles office  . RADIOACTIVE SEED IMPLANT N/A 05/26/2017   Procedure: RADIOACTIVE SEED IMPLANT/BRACHYTHERAPY IMPLANT;  Surgeon: Cleon Gustin, MD;  Location: [facility];  Service: Urology;  Laterality: N/A;  . SPACE OAR INSTILLATION N/A 05/26/2017   Procedure: SPACE OAR INSTILLATION;  Surgeon: Cleon Gustin, MD;  Location: [facility];  Service: Urology;  Laterality: N/A;  . TONSILLECTOMY  1970s    FAMILY HISTORY: Family History  Problem Relation Age of Onset  . Renal cancer Father   . Heart attack Paternal Grandmother   . Heart attack Paternal Grandfather   . Cancer Paternal Aunt        breast    SOCIAL HISTORY:  Social History   Socioeconomic History  . Marital status: Divorced    Spouse name: Not on file  . Number of children: 2  . Years of education: College  . Highest education level: Not on file  Occupational History  . Occupation: Semi-Retired  Social Needs  . Financial resource strain: Not on file  . Food insecurity:    Worry: Not on file    Inability: Not on file  . Transportation needs:    Medical: Not on file    Non-medical: Not on file  Tobacco Use  . Smoking status: Former Smoker    Packs/day: 1.50    Years: 20.00    Pack years: 30.00    Types: Cigarettes    Last attempt to quit: 05/26/1985    Years since quitting: 32.5  . Smokeless  tobacco: Never Used  Substance and Sexual Activity  . Alcohol use: Yes    Comment: occasional wine  . Drug use: No  . Sexual activity: Yes  Lifestyle  . Physical activity:    Days per week: Not on file    Minutes per session: Not on file  . Stress: Not on file  Relationships  . Social connections:    Talks on phone: Not on file    Gets together: Not on file    Attends religious service: Not on file    Active member of club or organization: Not on file    Attends meetings of  clubs or organizations: Not on file    Relationship status: Not on file  . Intimate partner violence:    Fear of current or ex partner: Not on file    Emotionally abused: Not on file    Physically abused: Not on file    Forced sexual activity: Not on file  Other Topics Concern  . Not on file  Social History Narrative   Patient lives at home alone.   Caffeine Use: occasionally     PHYSICAL EXAM   Vitals:   11/18/17 0721  BP: (!) 148/74  Pulse: 72  Weight: 218 lb 6.4 oz (99.1 kg)  Height: 5\' 8"  (1.727 m)    Not recorded      Body mass index is 33.21 kg/m.  PHYSICAL EXAMNIATION:  Gen: NAD, conversant, well nourised, obese, well groomed                     Cardiovascular: Regular rate rhythm, no peripheral edema, warm, nontender. Eyes: Conjunctivae clear without exudates or hemorrhage Neck: Supple, no carotid bruits. Pulmonary: Clear to auscultation bilaterally   NEUROLOGICAL EXAM:  MMSE - Mini Mental State Exam 11/18/2017 08/07/2017  Orientation to time 5 5  Orientation to Place 5 5  Registration 3 3  Attention/ Calculation 5 5  Recall 3 3  Language- name 2 objects 2 2  Language- repeat 1 1  Language- follow 3 step command 3 3  Language- read & follow direction 1 1  Write a sentence 1 1  Copy design 1 1  Total score 30 30  animal naming 6   CRANIAL NERVES: CN II: Visual fields are full to confrontation. Fundoscopic exam is normal with sharp discs and no vascular changes. Pupils  are round equal and briskly reactive to light. CN III, IV, VI: extraocular movement are normal. No ptosis. CN V: Facial sensation is intact to pinprick in all 3 divisions bilaterally. Corneal responses are intact.  CN VII: Face is symmetric with normal eye closure and smile. CN VIII: Hearing is normal to rubbing fingers CN IX, X: Palate elevates symmetrically. Phonation is normal. CN XI: Head turning and shoulder shrug are intact CN XII: Tongue is midline with normal movements and no atrophy.  MOTOR: There is no pronator drift of out-stretched arms. Muscle bulk and tone are normal. Muscle strength is normal.  REFLEXES: Reflexes are 2+ and symmetric at the biceps, triceps, knees, and ankles. Plantar responses are flexor.  SENSORY: Intact to light touch, pinprick, positional sensation and vibratory sensation are intact in fingers and toes.  COORDINATION: Rapid alternating movements and fine finger movements are intact. There is no dysmetria on finger-to-nose and heel-knee-shin.    GAIT/STANCE: Posture is normal. Gait is steady with normal steps, base, arm swing, and turning. Heel and toe walking are normal. Tandem gait is normal.  Romberg is absent.   DIAGNOSTIC DATA (LABS, IMAGING, TESTING) - I reviewed patient records, labs, notes, testing and imaging myself where available.   ASSESSMENT AND PLAN  Paul Moon is a 72 y.o. male   Mild cognitive impairment  MRI of the brain showed generalized atrophy supratentorium small vessel disease  Continue t Namenda 10 mg twice a day, Aricept 10 mg daily  I emphasized the importance of moderate daily exercise  Optimize vascular risk factor control, use CPAP machine     Marcial Pacas, M.D. Ph.D.  [facility] [address], [place], [place] [address] Ph: [phone] Fax: [phone]  CC:  Deland Pretty, MD

## 2017-11-19 DIAGNOSIS — H25813 Combined forms of age-related cataract, bilateral: Secondary | ICD-10-CM | POA: Diagnosis not present

## 2017-11-19 DIAGNOSIS — E119 Type 2 diabetes mellitus without complications: Secondary | ICD-10-CM | POA: Diagnosis not present

## 2017-11-19 DIAGNOSIS — H04123 Dry eye syndrome of bilateral lacrimal glands: Secondary | ICD-10-CM | POA: Diagnosis not present

## 2017-11-19 DIAGNOSIS — H43813 Vitreous degeneration, bilateral: Secondary | ICD-10-CM | POA: Diagnosis not present

## 2017-11-21 ENCOUNTER — Encounter: Payer: Self-pay | Admitting: Neurology

## 2017-11-27 DIAGNOSIS — B351 Tinea unguium: Secondary | ICD-10-CM | POA: Diagnosis not present

## 2017-11-27 DIAGNOSIS — L600 Ingrowing nail: Secondary | ICD-10-CM | POA: Diagnosis not present

## 2017-11-27 DIAGNOSIS — M79674 Pain in right toe(s): Secondary | ICD-10-CM | POA: Diagnosis not present

## 2017-11-27 DIAGNOSIS — M79675 Pain in left toe(s): Secondary | ICD-10-CM | POA: Diagnosis not present

## 2017-12-25 DIAGNOSIS — M79675 Pain in left toe(s): Secondary | ICD-10-CM | POA: Diagnosis not present

## 2017-12-25 DIAGNOSIS — B351 Tinea unguium: Secondary | ICD-10-CM | POA: Diagnosis not present

## 2017-12-25 DIAGNOSIS — L600 Ingrowing nail: Secondary | ICD-10-CM | POA: Diagnosis not present

## 2017-12-25 DIAGNOSIS — M79674 Pain in right toe(s): Secondary | ICD-10-CM | POA: Diagnosis not present

## 2017-12-25 DIAGNOSIS — G4733 Obstructive sleep apnea (adult) (pediatric): Secondary | ICD-10-CM | POA: Diagnosis not present

## 2018-01-06 DIAGNOSIS — R739 Hyperglycemia, unspecified: Secondary | ICD-10-CM | POA: Diagnosis not present

## 2018-01-06 DIAGNOSIS — I10 Essential (primary) hypertension: Secondary | ICD-10-CM | POA: Diagnosis not present

## 2018-01-12 DIAGNOSIS — I10 Essential (primary) hypertension: Secondary | ICD-10-CM | POA: Diagnosis not present

## 2018-01-12 DIAGNOSIS — R739 Hyperglycemia, unspecified: Secondary | ICD-10-CM | POA: Diagnosis not present

## 2018-01-12 DIAGNOSIS — G4733 Obstructive sleep apnea (adult) (pediatric): Secondary | ICD-10-CM | POA: Diagnosis not present

## 2018-01-13 DIAGNOSIS — R413 Other amnesia: Secondary | ICD-10-CM | POA: Diagnosis not present

## 2018-01-13 DIAGNOSIS — I10 Essential (primary) hypertension: Secondary | ICD-10-CM | POA: Diagnosis not present

## 2018-01-13 DIAGNOSIS — Z0000 Encounter for general adult medical examination without abnormal findings: Secondary | ICD-10-CM | POA: Diagnosis not present

## 2018-01-22 DIAGNOSIS — R350 Frequency of micturition: Secondary | ICD-10-CM | POA: Diagnosis not present

## 2018-02-04 DIAGNOSIS — M79675 Pain in left toe(s): Secondary | ICD-10-CM | POA: Diagnosis not present

## 2018-02-04 DIAGNOSIS — M79674 Pain in right toe(s): Secondary | ICD-10-CM | POA: Diagnosis not present

## 2018-02-04 DIAGNOSIS — L600 Ingrowing nail: Secondary | ICD-10-CM | POA: Diagnosis not present

## 2018-02-11 DIAGNOSIS — G8929 Other chronic pain: Secondary | ICD-10-CM | POA: Diagnosis not present

## 2018-02-11 DIAGNOSIS — M25511 Pain in right shoulder: Secondary | ICD-10-CM | POA: Diagnosis not present

## 2018-02-12 DIAGNOSIS — Z79899 Other long term (current) drug therapy: Secondary | ICD-10-CM | POA: Diagnosis not present

## 2018-02-12 DIAGNOSIS — M109 Gout, unspecified: Secondary | ICD-10-CM | POA: Diagnosis not present

## 2018-02-12 DIAGNOSIS — Z1159 Encounter for screening for other viral diseases: Secondary | ICD-10-CM | POA: Diagnosis not present

## 2018-02-12 DIAGNOSIS — I10 Essential (primary) hypertension: Secondary | ICD-10-CM | POA: Diagnosis not present

## 2018-02-12 DIAGNOSIS — Z0001 Encounter for general adult medical examination with abnormal findings: Secondary | ICD-10-CM | POA: Diagnosis not present

## 2018-02-12 DIAGNOSIS — R011 Cardiac murmur, unspecified: Secondary | ICD-10-CM | POA: Diagnosis not present

## 2018-02-20 DIAGNOSIS — M1990 Unspecified osteoarthritis, unspecified site: Secondary | ICD-10-CM | POA: Diagnosis not present

## 2018-02-20 DIAGNOSIS — M25511 Pain in right shoulder: Secondary | ICD-10-CM | POA: Diagnosis not present

## 2018-02-20 DIAGNOSIS — M7581 Other shoulder lesions, right shoulder: Secondary | ICD-10-CM | POA: Diagnosis not present

## 2018-03-17 ENCOUNTER — Encounter

## 2018-03-17 ENCOUNTER — Ambulatory Visit: Payer: Medicare Other | Admitting: Neurology

## 2018-03-17 ENCOUNTER — Encounter: Payer: Self-pay | Admitting: Neurology

## 2018-03-17 VITALS — BP 148/70 | HR 71 | Ht 68.0 in | Wt 217.0 lb

## 2018-03-17 DIAGNOSIS — G3184 Mild cognitive impairment, so stated: Secondary | ICD-10-CM

## 2018-03-17 MED ORDER — DONEPEZIL HCL 10 MG PO TABS: 10.0000 mg | ORAL_TABLET | Freq: Every day | ORAL | 4 refills | Status: DC

## 2018-03-17 MED ORDER — MEMANTINE HCL 10 MG PO TABS: 10.0000 mg | ORAL_TABLET | Freq: Two times a day (BID) | ORAL | 4 refills | Status: DC

## 2018-03-17 NOTE — Progress Notes (Signed)
PATIENT: Paul Moon DOB: [birth year]  Chief Complaint  Patient presents with  . Mild Cognitive Impairment    Last MMSE on 11/18/17 was 30/30.  Feels with the combination of memantine, donepezil and more recently Lexapro (from PCP) has improved his memory/mood.     HISTORICAL  Paul Moon is a 73 years old male, seen in refer by his primary care physician Dr. Shelia Media, [facility] Jew for evaluation of memory loss, initial evaluation was on August 07, 2017, he is alone at today's clinical visit.  I reviewed and summarized the referring note, he had a history of hypertension, prostate hypertrophy, B12, vitamin D deficiency, bipolar disorder, he was treated for couple years, could not tolerate the medication due to side effect,  He was noted to have slight memory loss since 2018, he works as a Forensic psychologist, he tends to overlook details, sometimes put things aside not taking care of it a long time, he had prostate surgery in March 2019 under general anesthesia, was noted to have increased memory loss since then, he could not remember that he had put will into place.   He has gained 10 pounds over the last few months, was not able to keep up his exercise routine,  His maternal uncle suffered dementia, maternal grandmother suffered memory loss in her 56s   MRI of brain in 2015 showed mild generalized atrophy, mild supratentorium small vessel disease.  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  He exercise regularly, still works as Midwife.   His mother at age 1, also has mild memory loss.   REVIEW OF SYSTEMS: Full 14 system review of systems performed and notable only for as above All rest review of systems were negative   ALLERGIES: Allergies  Allergen Reactions  . Hctz [Hydrochlorothiazide]     Unknown reaction  . Lactose Intolerance (Gi)   . Lisinopril Cough  . Risperdal [Risperidone] Other (See Comments)    "not be lucid, be foggy"  . Seroquel [Quetiapine Fumarate] Other (See Comments)    Joint swelling    HOME MEDICATIONS: Current Outpatient Medications  Medication Sig Dispense Refill  . amLODipine (NORVASC) 5 MG tablet Take 5 mg by mouth daily.    Marland Kitchen donepezil (ARICEPT) 10 MG tablet Take 1 tablet (10 mg total) by mouth at bedtime. 30 tablet 11  . escitalopram (LEXAPRO) 10 MG tablet Take 10 mg by mouth daily.    . finasteride (PROSCAR) 5 MG tablet Take 5 mg by mouth every morning.     Marland Kitchen GARLIC PO Take 1 capsule by mouth daily.     Nyoka Cowden Tea, Camellia sinensis, (GREEN TEA EXTRACT PO) Take by mouth.    . losartan (COZAAR) 100 MG tablet Take 100 mg by mouth at bedtime.     . memantine (NAMENDA) 10 MG tablet Take 1 tablet (10 mg total) by mouth 2 (two) times daily. 60 tablet 11  . Mirabegron (MYRBETRIQ  PO) Take by mouth.    . tadalafil (CIALIS) 10 MG tablet Take 10-20 mg by mouth as needed for erectile dysfunction.    . tamsulosin (FLOMAX) 0.4 MG CAPS capsule TK 1 C PO QD  2   No current facility-administered medications for this visit.     PAST MEDICAL HISTORY: Past Medical History:  Diagnosis Date  . Arthritis    INDEX FINGER JOINT LEFT HAND, right knee  . Bipolar disorder ([place])   . Depression   . History of gout 2018   right hand 2 fingers and right wrist-- 05-20-2017 per pt resolved  . Hyperplasia of prostate with lower urinary tract symptoms (LUTS)   .  Hypertension   . OSA on CPAP    per study 03-22-2013  moderate OSA w/ AHI 26.8/hr  . Pinched cervical nerve root    PT STATES C 7-8 PINCHED NERVE CAUSING NUMBNESS MIDDLE, RING AND LITTLE FINGER LEFT HAND - NO PAIN - STATES HE IS TRYING TO AVOID SURGERY.  . Pre-diabetes    followed by pcp  . Prostate cancer [facility]) urologist-  dr Alyson Ingles  oncologist-  dr Tammi Klippel   dx 05-24-2016  Stage T1c, Gleason 4+3, PSA 5.49, vol 65.5cc/  pt seek second opinion's at [facility] and [facility] not candidate for focal laser ablation/  scheduled for radiactive seed implants 05-26-2017  . Wears glasses     PAST SURGICAL HISTORY: Past Surgical History:  Procedure Laterality Date  . CIRCUMCISION  1973 approx.  Marland Kitchen KNEE ARTHROSCOPY Left 04/28/2013   Procedure: LEFT ARTHROSCOPY KNEE WITH DEBRIDEMENT meniscal debridement;  Surgeon: Gearlean Alf, MD;  [facility];  Service: Orthopedics;  Laterality: Left;  . KNEE SURGERY Right 2008  . MR GUIDED PROSTATE BIOPSY  12-12-2016    [facility] [place]   w/ general anesthesia  . PROSTATE BIOPSY  05-29-2016   dr Alyson Ingles office  . RADIOACTIVE SEED IMPLANT N/A 05/26/2017   Procedure: RADIOACTIVE SEED IMPLANT/BRACHYTHERAPY IMPLANT;  Surgeon: Cleon Gustin, MD;  Location: [facility];  Service: Urology;  Laterality: N/A;  . SPACE OAR INSTILLATION N/A 05/26/2017   Procedure: SPACE OAR INSTILLATION;  Surgeon: Cleon Gustin, MD;  Location: [facility];  Service: Urology;  Laterality: N/A;  . TONSILLECTOMY  1970s    FAMILY HISTORY: Family History  Problem Relation Age of Onset  . Renal cancer Father   . Heart attack Paternal Grandmother   . Heart attack Paternal Grandfather   . Cancer Paternal Aunt        breast    SOCIAL HISTORY:  Social History   Socioeconomic History  . Marital status: Divorced    Spouse name: Not on file  . Number of children: 2  . Years of education: College  . Highest education level: Not  on file  Occupational History  . Occupation: Semi-Retired  Social Needs  . Financial resource strain: Not on file  . Food insecurity:    Worry: Not on file    Inability: Not on file  . Transportation needs:    Medical: Not on file    Non-medical: Not on file  Tobacco Use  . Smoking status: Former Smoker    Packs/day: 1.50    Years: 20.00    Pack years: 30.00    Types: Cigarettes    Last attempt to quit: 05/26/1985    Years since quitting: 32.8  . Smokeless tobacco: Never Used  Substance and Sexual Activity  . Alcohol use: Yes  Comment: occasional wine  . Drug use: No  . Sexual activity: Yes  Lifestyle  . Physical activity:    Days per week: Not on file    Minutes per session: Not on file  . Stress: Not on file  Relationships  . Social connections:    Talks on phone: Not on file    Gets together: Not on file    Attends religious service: Not on file    Active member of club or organization: Not on file    Attends meetings of clubs or organizations: Not on file    Relationship status: Not on file  . Intimate partner violence:    Fear of current or ex partner: Not on file    Emotionally abused: Not on file    Physically abused: Not on file    Forced sexual activity: Not on file  Other Topics Concern  . Not on file  Social History Narrative   Patient lives at home alone.   Caffeine Use: occasionally     PHYSICAL EXAM   Vitals:   03/17/18 0725  BP: (!) 148/70  Pulse: 71  Weight: 217 lb (98.4 kg)  Height: 5\' 8"  (1.727 m)    Not recorded      Body mass index is 32.99 kg/m.  PHYSICAL EXAMNIATION:  Gen: NAD, conversant, well nourised, obese, well groomed                     Cardiovascular: Regular rate rhythm, no peripheral edema, warm, nontender. Eyes: Conjunctivae clear without exudates or hemorrhage Neck: Supple, no carotid bruits. Pulmonary: Clear to auscultation bilaterally   NEUROLOGICAL EXAM:  MMSE - Mini Mental State Exam 11/18/2017 08/07/2017   Orientation to time 5 5  Orientation to Place 5 5  Registration 3 3  Attention/ Calculation 5 5  Recall 3 3  Language- name 2 objects 2 2  Language- repeat 1 1  Language- follow 3 step command 3 3  Language- read & follow direction 1 1  Write a sentence 1 1  Copy design 1 1  Total score 30 30  animal naming 6   CRANIAL NERVES: CN II: Visual fields are full to confrontation. Pupils are round equal and briskly reactive to light. CN III, IV, VI: extraocular movement are normal. No ptosis. CN V: Facial sensation is intact to pinprick in all 3 divisions bilaterally. Corneal responses are intact.  CN VII: Face is symmetric with normal eye closure and smile. CN VIII: Hearing is normal to rubbing fingers CN IX, X: Palate elevates symmetrically. Phonation is normal. CN XI: Head turning and shoulder shrug are intact CN XII: Tongue is midline with normal movements and no atrophy.  MOTOR: There is no pronator drift of out-stretched arms. Muscle bulk and tone are normal. Muscle strength is normal.  REFLEXES: Reflexes are 2+ and symmetric at the biceps, triceps, knees, and ankles. Plantar responses are flexor.  SENSORY: Intact to light touch, pinprick, positional sensation and vibratory sensation are intact in fingers and toes.  COORDINATION: Rapid alternating movements and fine finger movements are intact. There is no dysmetria on finger-to-nose and heel-knee-shin.    GAIT/STANCE: Posture is normal. Gait is steady with normal steps, base, arm swing, and turning. Heel and toe walking are normal. Tandem gait is normal.  Romberg is absent.   DIAGNOSTIC DATA (LABS, IMAGING, TESTING) - I reviewed patient records, labs, notes, testing and imaging myself where available.   ASSESSMENT AND PLAN  [name]  is a 73 y.o. male   Mild cognitive impairment  MRI of the brain showed generalized atrophy supratentorium small vessel disease  Continue  Namenda 10 mg twice a day, Aricept 10 mg  daily  I emphasized the importance of moderate daily exercise  Optimize vascular risk factor control, use CPAP machine  Only return to clinic for new issues     Marcial Pacas, M.D. Ph.D.  [facility] [address], [place], [place] [address] Ph: [phone] Fax: [phone]  CC: Deland Pretty, MD

## 2018-10-07 ENCOUNTER — Other Ambulatory Visit: Payer: Self-pay

## 2018-10-07 ENCOUNTER — Ambulatory Visit
Admission: RE | Admit: 2018-10-07 | Discharge: 2018-10-07 | Disposition: A | Discharge status: Home / Self Care | Payer: Medicare Other | Source: Ambulatory Visit | Attending: Family | Admitting: Family

## 2018-10-07 ENCOUNTER — Other Ambulatory Visit: Payer: Self-pay | Admitting: Family

## 2018-10-07 DIAGNOSIS — M25561 Pain in right knee: Secondary | ICD-10-CM

## 2018-10-08 ENCOUNTER — Encounter: Payer: Self-pay | Admitting: Orthopaedic Surgery

## 2018-10-08 ENCOUNTER — Ambulatory Visit (INDEPENDENT_AMBULATORY_CARE_PROVIDER_SITE_OTHER): Payer: Medicare Other | Admitting: Orthopaedic Surgery

## 2018-10-08 ENCOUNTER — Ambulatory Visit: Payer: Medicare Other

## 2018-10-08 DIAGNOSIS — M25511 Pain in right shoulder: Secondary | ICD-10-CM

## 2018-10-08 DIAGNOSIS — G8929 Other chronic pain: Secondary | ICD-10-CM | POA: Diagnosis not present

## 2018-10-08 DIAGNOSIS — M1711 Unilateral primary osteoarthritis, right knee: Secondary | ICD-10-CM | POA: Diagnosis not present

## 2018-10-08 NOTE — Progress Notes (Signed)
Office Visit Note   Patient: Paul Moon           Date of Birth: [birth year]           MRN: [id number] Visit Date: 10/08/2018              Requested by: Sonia Side., FNP [place],  [place] [id number] PCP: Sonia Side., FNP   Assessment & Plan: Visit Diagnoses:  1. Chronic right shoulder pain   2. Unilateral primary osteoarthritis, right knee     Plan: Impression is right shoulder rotator cuff tendinitis #2 right knee arthritis flareup which appears to have resolved.  In regards to the right shoulder, please he has had multiple cortisone injections without significant relief I believe is appropriate to proceed with an MRI to further assess for structural abnormalities.  Follow-up with [place] once that has been completed.  In regards to his knee, I do not believe he is symptomatic enough for cortisone injection.  He will use over-the-counter anti-inflammatory cream which he already has as needed for his pain.   Follow-Up Instructions: Return for after MRI.   Orders:  Orders Placed This Encounter  Procedures  . XR Shoulder Right   No orders of the defined types were placed in this encounter.     Procedures: No procedures performed   Clinical Data: No additional findings.   Subjective: Chief Complaint  Patient presents with  . Right Knee - Pain  . Right Shoulder - Pain    HPI Paul Moon is a pleasant 73 year old gentleman who presents our clinic today with right knee and right shoulder pain.  In regards to the right knee, his pain began approximately 1-1/2 years ago and was exacerbated several months back when he was started on an antidepressant.  He has since weaned off the antidepressant approximately 3 weeks ago and his pain is dramatically improved.  He has a 0.5 out of 10 pain to the medial aspect of the right knee.  This is described as a constant mild ache.  No mechanical symptoms.  Nothing seems to aggravate his pain.  No radicular symptoms noted.   He does have a history of bilateral knee debriding arthroscopy several years back.  In regards to his right shoulder, he has had intermittent pain for the past few years.  He has had several cortisone injections with the last one being at the end of 2019.  He had moderate relief of symptoms but these were not long-lasting.  He has pain with any overhead activity to include swimming or using his dumbbells.  He does not have pain at rest or with sleeping.  He denies any radicular symptoms.  No previous MRI of the right shoulder that he is aware of.  Review of Systems as detailed in HPI.  All others reviewed and are negative.   Objective: Vital Signs: There were no vitals taken for this visit.  Physical Exam well-developed and well-nourished gentleman in no acute distress.  Alert and oriented x3.  Ortho Exam examination of his right shoulder reveals full active range of motion all planes, although he does have pain with the extremes of forward flexion.  He can internally rotate to T12.  Minimally positive empty can and cross body abduction.  No tenderness over the bicipital groove.  No tenderness over the [facility] joint.  He is neurovascularly intact distally.  Examination of his right knee shows no effusion.  Range of motion 0 to 130 degrees.  Mild tenderness medial joint line.  Ligaments are stable.  He is neurovascular intact distally.  Specialty Comments:  No specialty comments available.  Imaging: Xr Shoulder Right  Result Date: 10/08/2018 [facility] joint arthrosis.  Type 2 acromion.  Small irregularity of greater tuberosity.    PMFS History: Patient Active Problem List   Diagnosis Date Noted  . Mild cognitive impairment 08/07/2017  . Prostate cancer ([place]) 06/18/2016  . Meralgia paresthetica of right side 02/18/2014  . Acute medial meniscal tear 04/27/2013  . Obstructive sleep apnea 04/09/2013  . Tremor 03/22/2013  . Headache(784.0) 03/22/2013  . Cervical radiculopathy 07/10/2011   Past Medical  History:  Diagnosis Date  . Arthritis    INDEX FINGER JOINT LEFT HAND, right knee  . Bipolar disorder ([place])   . Depression   . History of gout 2018   right hand 2 fingers and right wrist-- 05-20-2017 per pt resolved  . Hyperplasia of prostate with lower urinary tract symptoms (LUTS)   . Hypertension   . OSA on CPAP    per study 03-22-2013  moderate OSA w/ AHI 26.8/hr  . Pinched cervical nerve root    PT STATES C 7-8 PINCHED NERVE CAUSING NUMBNESS MIDDLE, RING AND LITTLE FINGER LEFT HAND - NO PAIN - STATES HE IS TRYING TO AVOID SURGERY.  . Pre-diabetes    followed by pcp  . Prostate cancer [facility]) urologist-  dr Alyson Ingles  oncologist-  dr Tammi Klippel   dx 05-24-2016  Stage T1c, Gleason 4+3, PSA 5.49, vol 65.5cc/  pt seek second opinion's at [facility] and [facility] not candidate for focal laser ablation/  scheduled for radiactive seed implants 05-26-2017  . Wears glasses     Family History  Problem Relation Age of Onset  . Renal cancer Father   . Heart attack Paternal Grandmother   . Heart attack Paternal Grandfather   . Cancer Paternal Aunt        breast    Past Surgical History:  Procedure Laterality Date  . CIRCUMCISION  1973 approx.  Marland Kitchen KNEE ARTHROSCOPY Left 04/28/2013   Procedure: LEFT ARTHROSCOPY KNEE WITH DEBRIDEMENT meniscal debridement;  Surgeon: Gearlean Alf, MD;  [facility];  Service: Orthopedics;  Laterality: Left;  . KNEE SURGERY Right 2008  . MR GUIDED PROSTATE BIOPSY  12-12-2016    [facility], [place])   w/ general anesthesia  . PROSTATE BIOPSY  05-29-2016   dr Alyson Ingles office  . RADIOACTIVE SEED IMPLANT N/A 05/26/2017   Procedure: RADIOACTIVE SEED IMPLANT/BRACHYTHERAPY IMPLANT;  Surgeon: Cleon Gustin, MD;  Location: [facility];  Service: Urology;  Laterality: N/A;  . SPACE OAR INSTILLATION N/A 05/26/2017   Procedure: SPACE OAR INSTILLATION;  Surgeon: Cleon Gustin, MD;  Location: [facility];  Service: Urology;   Laterality: N/A;  . TONSILLECTOMY  1970s   Social History   Occupational History  . Occupation: Semi-Retired  Tobacco Use  . Smoking status: Former Smoker    Packs/day: 1.50    Years: 20.00    Pack years: 30.00    Types: Cigarettes    Quit date: 05/26/1985    Years since quitting: 33.3  . Smokeless tobacco: Never Used  Substance and Sexual Activity  . Alcohol use: Yes    Comment: occasional wine  . Drug use: No  . Sexual activity: Yes

## 2018-10-19 ENCOUNTER — Telehealth: Payer: Self-pay | Admitting: Orthopaedic Surgery

## 2018-10-19 DIAGNOSIS — M25511 Pain in right shoulder: Secondary | ICD-10-CM

## 2018-10-19 DIAGNOSIS — G8929 Other chronic pain: Secondary | ICD-10-CM

## 2018-10-19 NOTE — Telephone Encounter (Signed)
Please see messages below. MRI order was not entered originally, but has been now. Patient was seen on 10/08/2018 when order would have been entered. Thanks.

## 2018-10-19 NOTE — Telephone Encounter (Signed)
without

## 2018-10-19 NOTE — Telephone Encounter (Signed)
Patient called stating that he has not heard anything about his MRI and wanted to know the status.  CB#[phone].  Thank you.

## 2018-10-19 NOTE — Telephone Encounter (Signed)
Do not see order for MRI Shoulder in chart, however, it is mentioned in note. Would you like with or without contrast?

## 2018-10-20 NOTE — Telephone Encounter (Signed)
NO PA required and order was emailed to [place] with Raliegh Ip, she will contact pt to scheudle appt

## 2018-10-22 ENCOUNTER — Ambulatory Visit: Payer: Medicare Other | Admitting: Orthopaedic Surgery

## 2018-10-27 ENCOUNTER — Ambulatory Visit: Payer: Medicare Other | Admitting: Orthopaedic Surgery

## 2018-11-03 ENCOUNTER — Ambulatory Visit (INDEPENDENT_AMBULATORY_CARE_PROVIDER_SITE_OTHER): Payer: Medicare Other | Admitting: Orthopaedic Surgery

## 2018-11-03 ENCOUNTER — Encounter: Payer: Self-pay | Admitting: Orthopaedic Surgery

## 2018-11-03 DIAGNOSIS — G8929 Other chronic pain: Secondary | ICD-10-CM | POA: Diagnosis not present

## 2018-11-03 DIAGNOSIS — M25511 Pain in right shoulder: Secondary | ICD-10-CM

## 2018-11-03 NOTE — Progress Notes (Signed)
Office Visit Note   Patient: Paul Moon           Date of Birth: 1945/08/12           MRN: [id number] Visit Date: 11/03/2018              Requested by: Sonia Side., FNP [place],  [place] [id number] PCP: Sonia Side., FNP   Assessment & Plan: Visit Diagnoses:  1. Chronic right shoulder pain     Plan: MRI is consistent with chronic supraspinatus and infraspinatus tear with atrophy.  He does have a superiorly migrated humeral head.  He has mild degenerative changes of the glenohumeral joint.  These findings were discussed with the patient in detail.  Functionally speaking he is quite well compensated in light of these findings and his pain is at a reasonable level.  I recommend outpatient physical therapy for strengthening but I do not feel that patient is at a point where he needs a shoulder replacement.  He is also not interested in having surgery at this time.  Questions encouraged and answered.  Follow-up as needed  Follow-Up Instructions: Return if symptoms worsen or fail to improve.   Orders:  No orders of the defined types were placed in this encounter.  No orders of the defined types were placed in this encounter.     Procedures: No procedures performed   Clinical Data: No additional findings.   Subjective: Chief Complaint  Patient presents with  . Follow-up    Able is a 73 year old gentleman who comes in for review of his right shoulder MRI.  No changes in medical history or how his right shoulder feels.   Review of Systems  Constitutional: Negative.   All other systems reviewed and are negative.    Objective: Vital Signs: There were no vitals taken for this visit.  Physical Exam Vitals signs and nursing note reviewed.  Constitutional:      Appearance: He is well-developed.  Pulmonary:     Effort: Pulmonary effort is normal.  Abdominal:     Palpations: Abdomen is soft.  Skin:    General: Skin is warm.  Neurological:     Mental Status: He is alert and oriented to person, place, and time.  Psychiatric:        Behavior: Behavior normal.        Thought Content: Thought content normal.        Judgment: Judgment normal.     Ortho Exam Right shoulder shows good active and passive range of motion.  His rotator cuff functions well against gravity.  Overall strength is 4/5 infraspinatus, 5-/5 supraspinatus.  Negative impingement. Specialty Comments:  No specialty comments available.  Imaging: No results found.   PMFS History: Patient Active Problem List   Diagnosis Date Noted  . Mild cognitive impairment 08/07/2017  . Prostate cancer ([place]) 06/18/2016  . Meralgia paresthetica of right side 02/18/2014  . Acute medial meniscal tear 04/27/2013  . Obstructive sleep apnea 04/09/2013  . Tremor 03/22/2013  . Headache(784.0) 03/22/2013  . Cervical radiculopathy 07/10/2011   Past Medical History:  Diagnosis Date  . Arthritis    INDEX FINGER JOINT LEFT HAND, right knee  . Bipolar disorder ([place])   . Depression   . History of gout 2018   right hand 2 fingers and right wrist-- 05-20-2017 per pt resolved  . Hyperplasia of prostate with lower urinary tract symptoms (LUTS)   . Hypertension   . OSA  on CPAP    per study 03-22-2013  moderate OSA w/ AHI 26.8/hr  . Pinched cervical nerve root    PT STATES C 7-8 PINCHED NERVE CAUSING NUMBNESS MIDDLE, RING AND LITTLE FINGER LEFT HAND - NO PAIN - STATES HE IS TRYING TO AVOID SURGERY.  . Pre-diabetes    followed by pcp  . Prostate cancer [facility]) urologist-  dr Alyson Ingles  oncologist-  dr Tammi Klippel   dx 05-24-2016  Stage T1c, Gleason 4+3, PSA 5.49, vol 65.5cc/  pt seek second opinion's at [facility] and [facility] not candidate for focal laser ablation/  scheduled for radiactive seed implants 05-26-2017  . Wears glasses     Family History  Problem Relation Age of Onset  . Renal cancer Father   . Heart attack Paternal Grandmother   . Heart attack Paternal Grandfather   .  Cancer Paternal Aunt        breast    Past Surgical History:  Procedure Laterality Date  . CIRCUMCISION  1973 approx.  Marland Kitchen KNEE ARTHROSCOPY Left 04/28/2013   Procedure: LEFT ARTHROSCOPY KNEE WITH DEBRIDEMENT meniscal debridement;  Surgeon: Gearlean Alf, MD;  [facility];  Service: Orthopedics;  Laterality: Left;  . KNEE SURGERY Right 2008  . MR GUIDED PROSTATE BIOPSY  12-12-2016    [facility], [place])   w/ general anesthesia  . PROSTATE BIOPSY  05-29-2016   dr Alyson Ingles office  . RADIOACTIVE SEED IMPLANT N/A 05/26/2017   Procedure: RADIOACTIVE SEED IMPLANT/BRACHYTHERAPY IMPLANT;  Surgeon: Cleon Gustin, MD;  Location: [facility];  Service: Urology;  Laterality: N/A;  . SPACE OAR INSTILLATION N/A 05/26/2017   Procedure: SPACE OAR INSTILLATION;  Surgeon: Cleon Gustin, MD;  Location: [facility];  Service: Urology;  Laterality: N/A;  . TONSILLECTOMY  1970s   Social History   Occupational History  . Occupation: Semi-Retired  Tobacco Use  . Smoking status: Former Smoker    Packs/day: 1.50    Years: 20.00    Pack years: 30.00    Types: Cigarettes    Quit date: 05/26/1985    Years since quitting: 33.4  . Smokeless tobacco: Never Used  Substance and Sexual Activity  . Alcohol use: Yes    Comment: occasional wine  . Drug use: No  . Sexual activity: Yes

## 2018-11-24 ENCOUNTER — Other Ambulatory Visit: Payer: Self-pay

## 2018-11-24 ENCOUNTER — Encounter: Payer: Self-pay | Admitting: Adult Health

## 2018-11-24 ENCOUNTER — Ambulatory Visit (INDEPENDENT_AMBULATORY_CARE_PROVIDER_SITE_OTHER): Payer: Medicare Other | Admitting: Adult Health

## 2018-11-24 VITALS — BP 156/88 | HR 90 | Temp 96.6°F | Ht 67.75 in | Wt 211.2 lb

## 2018-11-24 DIAGNOSIS — G3184 Mild cognitive impairment, so stated: Secondary | ICD-10-CM

## 2018-11-24 MED ORDER — DONEPEZIL HCL 10 MG PO TABS: 10.0000 mg | ORAL_TABLET | Freq: Every day | ORAL | 4 refills | Status: DC

## 2018-11-24 MED ORDER — MEMANTINE HCL 10 MG PO TABS: 10.0000 mg | ORAL_TABLET | Freq: Two times a day (BID) | ORAL | 4 refills | Status: DC
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
## 2018-11-24 NOTE — Progress Notes (Signed)
I have reviewed and agreed above plan. 

## 2018-11-24 NOTE — Patient Instructions (Signed)
Your Plan:  Continue to monitor memory Continue Namenda and Aricept  Continue with regular follow-ups with PCP If your symptoms worsen or you develop new symptoms please let us know.   Thank you for coming to see [place] at [facility]. I hope we have been able to provide you high quality care today.  You may receive a patient satisfaction survey over the next few weeks. We would appreciate your feedback and comments so that we may continue to improve ourselves and the health of our patients.

## 2019-02-14 ENCOUNTER — Encounter (HOSPITAL_COMMUNITY): Payer: Self-pay

## 2019-02-14 ENCOUNTER — Other Ambulatory Visit: Payer: Self-pay

## 2019-02-14 DIAGNOSIS — Z87891 Personal history of nicotine dependence: Secondary | ICD-10-CM | POA: Diagnosis not present

## 2019-02-14 DIAGNOSIS — R55 Syncope and collapse: Secondary | ICD-10-CM | POA: Diagnosis present

## 2019-02-14 DIAGNOSIS — Z79899 Other long term (current) drug therapy: Secondary | ICD-10-CM | POA: Insufficient documentation

## 2019-02-14 DIAGNOSIS — I10 Essential (primary) hypertension: Secondary | ICD-10-CM | POA: Insufficient documentation

## 2019-02-14 LAB — CBC
HCT: 37.4 % — ABNORMAL LOW (ref 39.0–52.0)
Hemoglobin: 12.2 g/dL — ABNORMAL LOW (ref 13.0–17.0)
MCH: 31.2 pg (ref 26.0–34.0)
MCHC: 32.6 g/dL (ref 30.0–36.0)
MCV: 95.7 fL (ref 80.0–100.0)
Platelets: 297 10*3/uL (ref 150–400)
RBC: 3.91 MIL/uL — ABNORMAL LOW (ref 4.22–5.81)
RDW: 15.8 % — ABNORMAL HIGH (ref 11.5–15.5)
WBC: 6.5 10*3/uL (ref 4.0–10.5)
nRBC: 0 % (ref 0.0–0.2)

## 2019-02-14 LAB — BASIC METABOLIC PANEL
Anion gap: 9 (ref 5–15)
BUN: 14 mg/dL (ref 8–23)
CO2: 24 mmol/L (ref 22–32)
Calcium: 9.1 mg/dL (ref 8.9–10.3)
Chloride: 105 mmol/L (ref 98–111)
Creatinine, Ser: 1.47 mg/dL — ABNORMAL HIGH (ref 0.61–1.24)
GFR calc Af Amer: 54 mL/min — ABNORMAL LOW (ref >60)
GFR calc non Af Amer: 47 mL/min — ABNORMAL LOW (ref >60)
Glucose, Bld: 170 mg/dL — ABNORMAL HIGH (ref 70–99)
Potassium: 4 mmol/L (ref 3.5–5.1)
Sodium: 138 mmol/L (ref 135–145)

## 2019-02-14 LAB — CBG MONITORING, ED: Glucose-Capillary: 169 mg/dL — ABNORMAL HIGH (ref 70–99)

## 2019-02-14 MED ORDER — SODIUM CHLORIDE 0.9% FLUSH: 3.0000 mL | Freq: Once | INTRAVENOUS | Status: DC

## 2019-02-14 NOTE — ED Triage Notes (Signed)
Patient arrived stating this evening he stood up and felt lightheaded. Had difficulty ambulating for a while after. States was directed by his primary care provider to report to the ED. Patient prediabetic and checked sugar at home, was 149

## 2019-02-15 ENCOUNTER — Emergency Department (HOSPITAL_COMMUNITY)
Admission: EM | Admit: 2019-02-15 | Discharge: 2019-02-15 | Disposition: A | Discharge status: Home / Self Care | Payer: Medicare Other | Attending: Emergency Medicine | Admitting: Emergency Medicine

## 2019-02-15 ENCOUNTER — Other Ambulatory Visit: Payer: Self-pay | Admitting: *Deleted

## 2019-02-15 DIAGNOSIS — R55 Syncope and collapse: Secondary | ICD-10-CM

## 2019-02-15 MED ORDER — DONEPEZIL HCL 10 MG PO TABS: 10.0000 mg | ORAL_TABLET | Freq: Every day | ORAL | 1 refills | Status: DC
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
## 2019-05-03 ENCOUNTER — Telehealth: Payer: Self-pay | Admitting: Adult Health

## 2019-05-03 NOTE — Telephone Encounter (Signed)
Patient called requesting a change in pharmacy.  He left a voicemail but I was unsure of the pharmacy name.  Please call patient back

## 2019-05-04 MED ORDER — MEMANTINE HCL 10 MG PO TABS: 10.0000 mg | ORAL_TABLET | Freq: Two times a day (BID) | ORAL | 2 refills | Status: DC

## 2019-05-04 MED ORDER — DONEPEZIL HCL 10 MG PO TABS: 10.0000 mg | ORAL_TABLET | Freq: Every day | ORAL | 2 refills | Status: DC

## 2019-05-04 NOTE — Addendum Note (Signed)
Addended by: Brandon Melnick on: 05/04/2019 11:46 AM   Modules accepted: Orders

## 2019-05-04 NOTE — Telephone Encounter (Signed)
Appt made for 11-15-19 with MM/NP.  He is wanting to remain with [place] for refills.  Refilled to [facility].

## 2019-06-19 IMAGING — MR MR PROSTATE WO/W CM
56 series · 56 of 56 positions shown · IV contrast (20 ml multihance)
Comparison: CT pelvis 01/22/2016

ADDENDUM:
The original report was by Dr. Tarhini Dieb. The following
addendum is by Dr. Tarhini Dieb:

The images from the 2 days worth of scanning have been successfully
combined in the [employer] DynaCAD system.
The prostate volume using contour analysis tools is 45.5 cubic cm.
The PI-RADS category 4 lesion size is refined to 1.5 by 0.7 cm in
plane and has a volume of 0.48 cubic cm. UroNAV data was produced.
CLINICAL DATA: Prostate cancer, active surveillance. Increasing PSA
level.
EXAM:
MR PROSTATE WITHOUT AND WITH CONTRAST
TECHNIQUE: Multiplanar multisequence MRI images were obtained of the pelvis
centered about the prostate. Pre and post contrast images were
obtained.
CONTRAST:  20mL MULTIHANCE GADOBENATE DIMEGLUMINE 529 MG/ML IV SOLN

[Series 4: T1 · axial · 8.0mm · 1.06mm/px · 1 of 28 slices shown (1 of 2)]
[im 1/28]
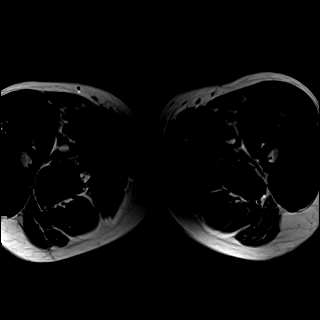

[Series 5: bSSFP fat-sat · axial · 8.0mm · 0.74mm/px · 1 of 28 slices shown (1 of 2)]
[im 1/28]
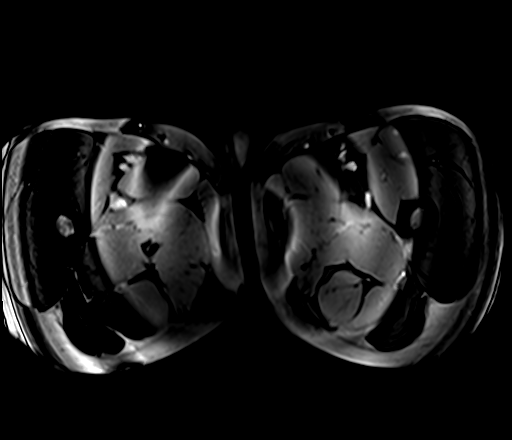

[Series 6: bSSFP fat-sat · axial · 8.0mm · 0.74mm/px · 1 of 28 slices shown (2 of 2)]
[im 1/28]
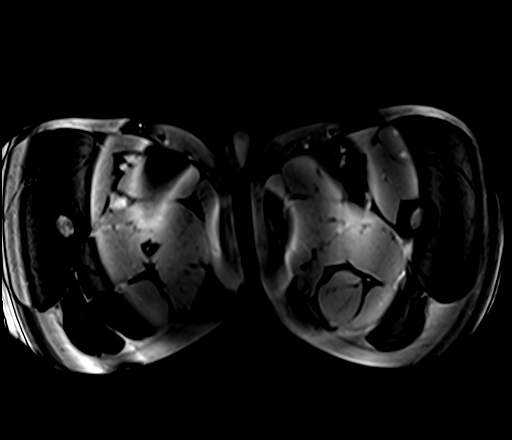

[Series 7: T2 · sagittal · 3.5mm · 0.56mm/px · 1 of 40 slices shown (1 of 4)]
[im 1/40]
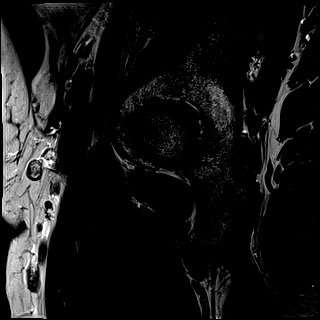

[Series 8: T1 · axial · 3.0mm · 0.31mm/px · 1 of 34 slices shown (2 of 2)]
[im 1/34]
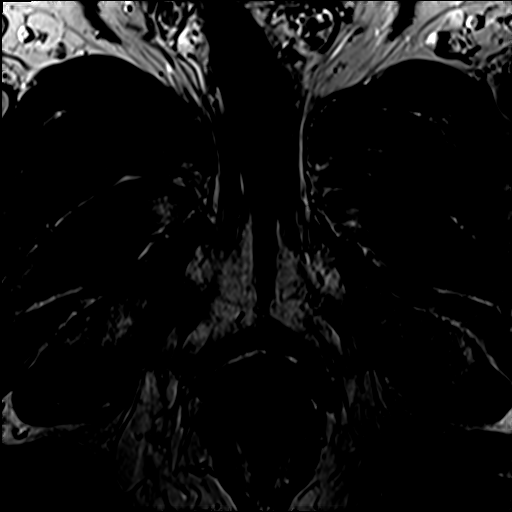

[Series 9: T2 · axial · 3.0mm · 0.56mm/px · 1 of 34 slices shown (2 of 4)]
[im 1/34]
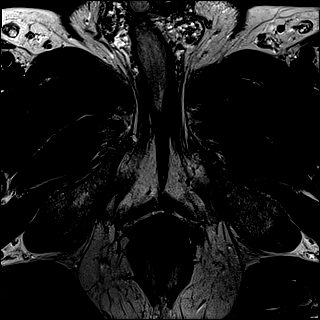

[Series 10: T2 · axial · 1.0mm · 1.04mm/px · 1 of 80 slices shown (3 of 4)]
[im 1/80]
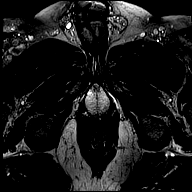

[Series 11: T2 · coronal · 3.5mm · 0.56mm/px · 1 of 28 slices shown (4 of 4)]
[im 1/28]
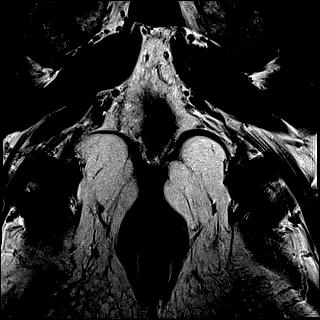

[Series 12: DWI · axial · 3.5mm · 1.56mm/px · 1 of 67 slices shown (1 of 2)]
[im 1/67]
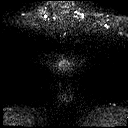

[Series 13: DWI · axial · 3.5mm · 1.56mm/px · 1 of 23 slices shown (2 of 2)]
[im 1/23]
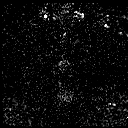

[Series 14: t1_twist_tra_dyn_ttc=6.7s · axial · 3.5mm · 0.83mm/px · 1 of 24 slices shown]
[im 1/24]
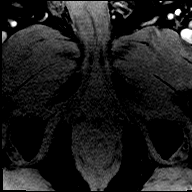

[Series 77: t1_twist_tra_dyn_ttc=5.3s · axial · 3.5mm · 0.83mm/px · 1 of 20 slices shown]
[im 1/20]
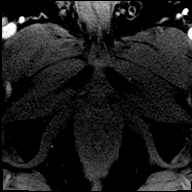

[Series 78: t1_twist_tra_dyn-copy center to · axial · 3.5mm · 0.83mm/px · 1 of 20 slices shown (1 of 28)]
[im 1/20]
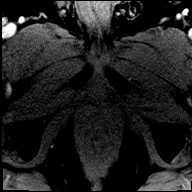

[Series 79: t1_twist_tra_dyn-copy center to · axial · 3.5mm · 0.83mm/px · 1 of 20 slices shown (2 of 28)]
[im 1/20]
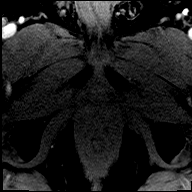

[Series 81: t1_twist_tra_dyn-copy center to · axial · 3.5mm · 0.83mm/px · 1 of 20 slices shown (3 of 28)]
[im 1/20]
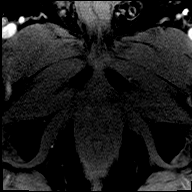

[Series 83: t1_twist_tra_dyn-copy center to · axial · 3.5mm · 0.83mm/px · 1 of 20 slices shown (4 of 28)]
[im 1/20]
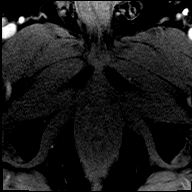

[Series 85: t1_twist_tra_dyn-copy center to · axial · 3.5mm · 0.83mm/px · 1 of 20 slices shown (5 of 28)]
[im 1/20]
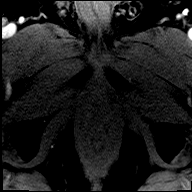

[Series 87: t1_twist_tra_dyn-copy center to · axial · 3.5mm · 0.83mm/px · 1 of 20 slices shown (6 of 28)]
[im 1/20]
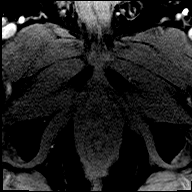

[Series 89: t1_twist_tra_dyn-copy center to · axial · 3.5mm · 0.83mm/px · 1 of 20 slices shown (7 of 28)]
[im 1/20]
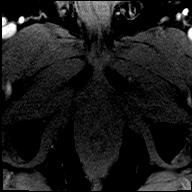

[Series 91: t1_twist_tra_dyn-copy center to · axial · 3.5mm · 0.83mm/px · 1 of 20 slices shown (8 of 28)]
[im 1/20]
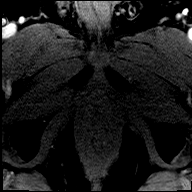

[Series 93: t1_twist_tra_dyn-copy center to · axial · 3.5mm · 0.83mm/px · 1 of 20 slices shown (9 of 28)]
[im 1/20]
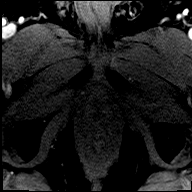

[Series 95: t1_twist_tra_dyn-copy center to · axial · 3.5mm · 0.83mm/px · 1 of 20 slices shown (10 of 28)]
[im 1/20]
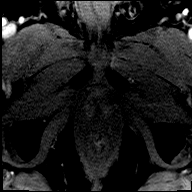

[Series 97: t1_twist_tra_dyn-copy center to · axial · 3.5mm · 0.83mm/px · 1 of 20 slices shown (11 of 28)]
[im 1/20]
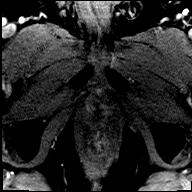

[Series 100: t1_twist_tra_dyn-copy center to · axial · 3.5mm · 0.83mm/px · 1 of 20 slices shown (12 of 28)]
[im 1/20]
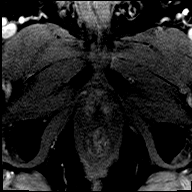

[Series 101: t1_twist_tra_dyn-copy center to_sub_ttc=(id) · axial · 3.5mm · 0.83mm/px · 1 of 20 slices shown (1 of 16)]
[im 1/20]
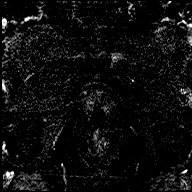

[Series 102: t1_twist_tra_dyn-copy center to · axial · 3.5mm · 0.83mm/px · 1 of 20 slices shown (13 of 28)]
[im 1/20]
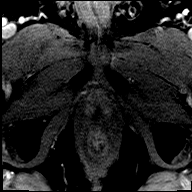

[Series 103: t1_twist_tra_dyn-copy center to_sub_ttc=(id) · axial · 3.5mm · 0.83mm/px · 1 of 20 slices shown (2 of 16)]
[im 1/20]
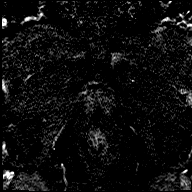

[Series 104: t1_twist_tra_dyn-copy center to · axial · 3.5mm · 0.83mm/px · 1 of 20 slices shown (14 of 28)]
[im 1/20]
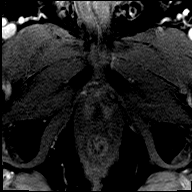

[Series 105: t1_twist_tra_dyn-copy center to_sub_ttc=(id) · axial · 3.5mm · 0.83mm/px · 1 of 20 slices shown (3 of 16)]
[im 1/20]
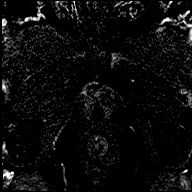

[Series 106: t1_twist_tra_dyn-copy center to · axial · 3.5mm · 0.83mm/px · 1 of 20 slices shown (15 of 28)]
[im 1/20]
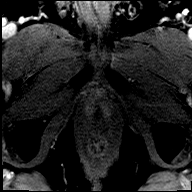

[Series 107: t1_twist_tra_dyn-copy center to_sub_ttc=(id) · axial · 3.5mm · 0.83mm/px · 1 of 20 slices shown (4 of 16)]
[im 1/20]
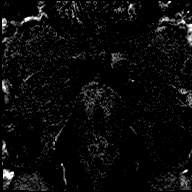

[Series 108: t1_twist_tra_dyn-copy center to · axial · 3.5mm · 0.83mm/px · 1 of 20 slices shown (16 of 28)]
[im 1/20]
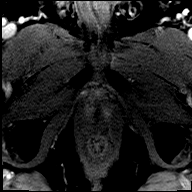

[Series 109: t1_twist_tra_dyn-copy center to_sub_ttc=(id) · axial · 3.5mm · 0.83mm/px · 1 of 20 slices shown (5 of 16)]
[im 1/20]
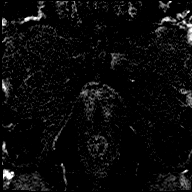

[Series 110: t1_twist_tra_dyn-copy center to · axial · 3.5mm · 0.83mm/px · 1 of 20 slices shown (17 of 28)]
[im 1/20]
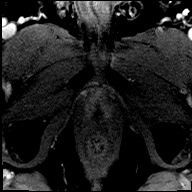

[Series 111: t1_twist_tra_dyn-copy center to_sub_ttc=(id) · axial · 3.5mm · 0.83mm/px · 1 of 20 slices shown (6 of 16)]
[im 1/20]
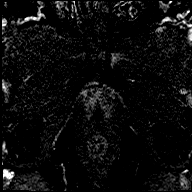

[Series 112: t1_twist_tra_dyn-copy center to · axial · 3.5mm · 0.83mm/px · 1 of 20 slices shown (18 of 28)]
[im 1/20]
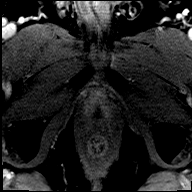

[Series 113: t1_twist_tra_dyn-copy center to_sub_ttc=(id) · axial · 3.5mm · 0.83mm/px · 1 of 20 slices shown (7 of 16)]
[im 1/20]
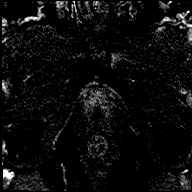

[Series 114: t1_twist_tra_dyn-copy center to · axial · 3.5mm · 0.83mm/px · 1 of 20 slices shown (19 of 28)]
[im 1/20]
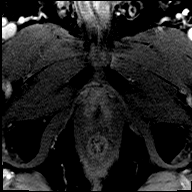

[Series 115: t1_twist_tra_dyn-copy center to_sub_ttc=(id) · axial · 3.5mm · 0.83mm/px · 1 of 20 slices shown (8 of 16)]
[im 1/20]
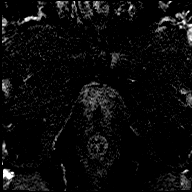

[Series 116: t1_twist_tra_dyn-copy center to · axial · 3.5mm · 0.83mm/px · 1 of 20 slices shown (20 of 28)]
[im 1/20]
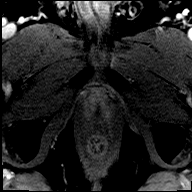

[Series 117: t1_twist_tra_dyn-copy center to_sub_ttc=(id) · axial · 3.5mm · 0.83mm/px · 1 of 20 slices shown (9 of 16)]
[im 1/20]
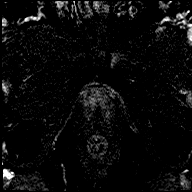

[Series 118: t1_twist_tra_dyn-copy center to · axial · 3.5mm · 0.83mm/px · 1 of 20 slices shown (21 of 28)]
[im 1/20]
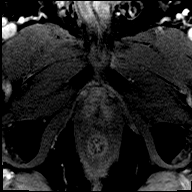

[Series 119: t1_twist_tra_dyn-copy center to_sub_ttc=(id) · axial · 3.5mm · 0.83mm/px · 1 of 20 slices shown (10 of 16)]
[im 1/20]
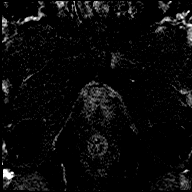

[Series 120: t1_twist_tra_dyn-copy center to · axial · 3.5mm · 0.83mm/px · 1 of 20 slices shown (22 of 28)]
[im 1/20]
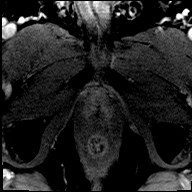

[Series 121: t1_twist_tra_dyn-copy center to_sub_ttc=(id) · axial · 3.5mm · 0.83mm/px · 1 of 20 slices shown (11 of 16)]
[im 1/20]
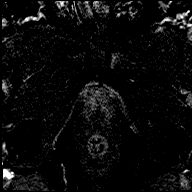

[Series 122: t1_twist_tra_dyn-copy center to · axial · 3.5mm · 0.83mm/px · 1 of 20 slices shown (23 of 28)]
[im 1/20]
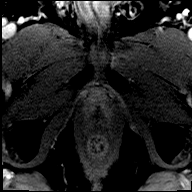

[Series 123: t1_twist_tra_dyn-copy center to_sub_ttc=(id) · axial · 3.5mm · 0.83mm/px · 1 of 20 slices shown (12 of 16)]
[im 1/20]
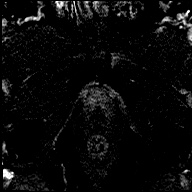

[Series 124: t1_twist_tra_dyn-copy center to · axial · 3.5mm · 0.83mm/px · 1 of 20 slices shown (24 of 28)]
[im 1/20]
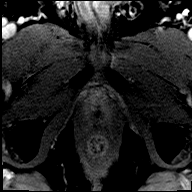

[Series 125: t1_twist_tra_dyn-copy center to_sub_ttc=(id) · axial · 3.5mm · 0.83mm/px · 1 of 20 slices shown (13 of 16)]
[im 1/20]
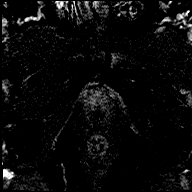

[Series 126: t1_twist_tra_dyn-copy center to · axial · 3.5mm · 0.83mm/px · 1 of 20 slices shown (25 of 28)]
[im 1/20]
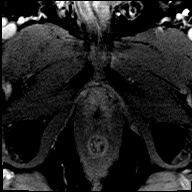

[Series 127: t1_twist_tra_dyn-copy center to_sub_ttc=(id) · axial · 3.5mm · 0.83mm/px · 1 of 20 slices shown (14 of 16)]
[im 1/20]
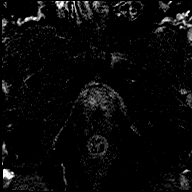

[Series 128: t1_twist_tra_dyn-copy center to · axial · 3.5mm · 0.83mm/px · 1 of 20 slices shown (26 of 28)]
[im 1/20]
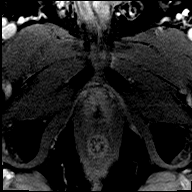

[Series 129: t1_twist_tra_dyn-copy center to_sub_ttc=(id) · axial · 3.5mm · 0.83mm/px · 1 of 20 slices shown (15 of 16)]
[im 1/20]
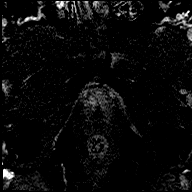

[Series 130: t1_twist_tra_dyn-copy center to · axial · 3.5mm · 0.83mm/px · 1 of 20 slices shown (27 of 28)]
[im 1/20]
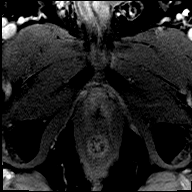

[Series 131: t1_twist_tra_dyn-copy center to_sub_ttc=(id) · axial · 3.5mm · 0.83mm/px · 1 of 20 slices shown (16 of 16)]
[im 1/20]
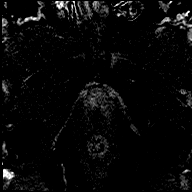

[Series 132: t1_twist_tra_dyn-copy center to · axial · 3.5mm · 0.83mm/px · 1 of 20 slices shown (28 of 28)]
[im 1/20]
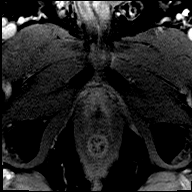

[56 of 56 positions shown; findings below may reference images not displayed]

FINDINGS: Prostate: Extending in the medial right peripheral zone and
potentially extending into the adjacent central zone near the
prostate base, there is a 1.1 by 0.9 cm focus of T2 hypointensity as
shown on image [DATE], with associated low ADC map activity but with
similar diffusion weighted characteristics compared to the rest of
the prostate gland.

On subtraction images this region demonstrates abnormal early
enhancement and some washout, a type 3 curve. PI-RADS category 4.

More refined lesion volume assessment based on DynaCAD prostate
contour analysis is forthcoming; difficulties encounter due to multi
day acquisition is preventing the exam from bloating correctly in
DynaCAD for analysis, but these issues will be resolved and an
addendum issued.

Volume: Based on direct gland measurements, the prostate gland
measures 5.1 by 3.8 by 4.5 cm (volume = 46 cm^3). A more refined
volume measurement based on DynaCAD prostate contour analysis is
forthcoming.

Transcapsular spread:  Absent

Seminal vesicle involvement: Absent

Neurovascular bundle involvement: Absent

Pelvic adenopathy: A left external iliac node measures 0.9 cm in
short axis, borderline prominent common no change from 01/22/2016. A
right external iliac node measures 0.6 cm in short axis.

Bone metastasis: Absent

Other findings: Small lipoma along the posterior margin of the left
piriformis muscle incidentally noted.
IMPRESSION: 1. PI-RADS category 4 lesion involving the medial right peripheral
zone in the mid gland, and potentially extending up towards the
central zone on the right near the base. This averages 1 cm in
diameter. DynaCAD analysis is pending ; due to hardware issues the
exam was obtained on 2 separate days and the technical process of
combining the data from the 2 days in DynaCAD is currently under
way. I will issue an addendum with [REDACTED] fine minute sub prostate
volume and other information once the DynaCAD data is available to
me.
2. Stable upper normal size left external iliac lymph node. No
findings of osseous metastatic disease. No extraprostatic extension
identified.

## 2019-08-11 NOTE — Patient Instructions (Addendum)
DUE TO COVID-19 ONLY ONE VISITOR IS ALLOWED TO COME WITH YOU AND STAY IN THE WAITING ROOM ONLY DURING PRE OP AND PROCEDURE DAY OF SURGERY. THE 1 VISITOR MAY VISIT WITH YOU AFTER SURGERY IN YOUR PRIVATE ROOM DURING VISITING HOURS ONLY!  YOU NEED TO HAVE A COVID 19 TEST ON: 6/17/21_ @ 9:00 am_, THIS TEST MUST BE DONE BEFORE SURGERY, COME  [address], [place] , [address].  ([place]) ONCE YOUR COVID TEST IS COMPLETED, PLEASE BEGIN THE QUARANTINE INSTRUCTIONS AS OUTLINED IN YOUR HANDOUT.                Paul Moon   Your procedure is scheduled on: 08/23/19   Report to [facility] Main  Entrance   Report to admitting at : 9:00 AM     Call this number if you have problems the morning of surgery [phone]    Remember:   NO SOLID FOOD AFTER MIDNIGHT THE NIGHT PRIOR TO SURGERY. NOTHING BY MOUTH EXCEPT CLEAR LIQUIDS UNTIL: 8:30 am . PLEASE FINISH ENSURE DRINK PER SURGEON ORDER  WHICH NEEDS TO BE COMPLETED AT: 8:30 am .   CLEAR LIQUID DIET   Foods Allowed                                                                     Foods Excluded  Coffee and tea, regular and decaf                             liquids that you cannot  Plain Jell-O any favor except red or purple                                           see through such as: Fruit ices (not with fruit pulp)                                     milk, soups, orange juice  Iced Popsicles                                    All solid food Carbonated beverages, regular and diet                                    Cranberry, grape and apple juices Sports drinks like Gatorade Lightly seasoned clear broth or consume(fat free) Sugar, honey syrup  Sample Menu Breakfast                                Lunch                                     Supper Cranberry juice  Beef broth                            Chicken broth Jell-O                                     Grape juice                            Apple juice Coffee or tea                        Jell-O                                      Popsicle                                                Coffee or tea                        Coffee or tea  _____________________________________________________________________  BRUSH YOUR TEETH MORNING OF SURGERY AND RINSE YOUR MOUTH OUT, NO CHEWING GUM CANDY OR MINTS.     Take these medicines the morning of surgery with A SIP OF WATER: allopurinol,finasteride,tamsulosin,mirabegron,memantine.                                You may not have any metal on your body including hair pins and              piercings  Do not wear jewelry, lotions, powders or perfumes, deodorant             Men may shave face and neck.   Do not bring valuables to the hospital. [place].  Contacts, dentures or bridgework may not be worn into surgery.  Leave suitcase in the car. After surgery it may be brought to your room.     Patients discharged the day of surgery will not be allowed to drive home. IF YOU ARE HAVING SURGERY AND GOING HOME THE SAME DAY, YOU MUST HAVE AN ADULT TO DRIVE YOU HOME AND BE WITH YOU FOR 24 HOURS. YOU MAY GO HOME BY TAXI OR UBER OR ORTHERWISE, BUT AN ADULT MUST ACCOMPANY YOU HOME AND STAY WITH YOU FOR 24 HOURS.  Name and phone number of your driver:  Special Instructions: N/A              Please read over the following fact sheets you were given: _____________________________________________________________________  [facility] - Preparing for Surgery Before surgery, you can play an important role.  Because skin is not sterile, your skin needs to be as free of germs as possible.  You can reduce the number of germs on your skin by washing with CHG (chlorahexidine gluconate) soap before surgery.  CHG is an antiseptic cleaner which kills germs and bonds with the skin to continue killing germs even after washing. Please DO NOT use  if you have  an allergy to CHG or antibacterial soaps.  If your skin becomes reddened/irritated stop using the CHG and inform your nurse when you arrive at Short Stay. Do not shave (including legs and underarms) for at least 48 hours prior to the first CHG shower.  You may shave your face/neck. Please follow these instructions carefully:  1.  Shower with CHG Soap the night before surgery and the  morning of Surgery.  2.  If you choose to wash your hair, wash your hair first as usual with your  normal  shampoo.  3.  After you shampoo, rinse your hair and body thoroughly to remove the  shampoo.                           4.  Use CHG as you would any other liquid soap.  You can apply chg directly  to the skin and wash                       Gently with a scrungie or clean washcloth.  5.  Apply the CHG Soap to your body ONLY FROM THE NECK DOWN.   Do not use on face/ open                           Wound or open sores. Avoid contact with eyes, ears mouth and genitals (private parts).                       Wash face,  Genitals (private parts) with your normal soap.             6.  Wash thoroughly, paying special attention to the area where your surgery  will be performed.  7.  Thoroughly rinse your body with warm water from the neck down.  8.  DO NOT shower/wash with your normal soap after using and rinsing off  the CHG Soap.                9.  Pat yourself dry with a clean towel.            10.  Wear clean pajamas.            11.  Place clean sheets on your bed the night of your first shower and do not  sleep with pets. Day of Surgery : Do not apply any lotions/deodorants the morning of surgery.  Please wear clean clothes to the hospital/surgery center.  FAILURE TO FOLLOW THESE INSTRUCTIONS MAY RESULT IN THE CANCELLATION OF YOUR SURGERY PATIENT SIGNATURE_________________________________  NURSE  SIGNATURE__________________________________  ________________________________________________________________________   Paul Moon  An incentive spirometer is a tool that can help keep your lungs clear and active. This tool measures how well you are filling your lungs with each breath. Taking long deep breaths may help reverse or decrease the chance of developing breathing (pulmonary) problems (especially infection) following:  A long period of time when you are unable to move or be active. BEFORE THE PROCEDURE   If the spirometer includes an indicator to show your best effort, your nurse or respiratory therapist will set it to a desired goal.  If possible, sit up straight or lean slightly forward. Try not to slouch.  Hold the incentive spirometer in an upright position. INSTRUCTIONS FOR USE  1. Sit on the edge of your bed if possible, or  sit up as far as you can in bed or on a chair. 2. Hold the incentive spirometer in an upright position. 3. Breathe out normally. 4. Place the mouthpiece in your mouth and seal your lips tightly around it. 5. Breathe in slowly and as deeply as possible, raising the piston or the ball toward the top of the column. 6. Hold your breath for 3-5 seconds or for as long as possible. Allow the piston or ball to fall to the bottom of the column. 7. Remove the mouthpiece from your mouth and breathe out normally. 8. Rest for a few seconds and repeat Steps 1 through 7 at least 10 times every 1-2 hours when you are awake. Take your time and take a few normal breaths between deep breaths. 9. The spirometer may include an indicator to show your best effort. Use the indicator as a goal to work toward during each repetition. 10. After each set of 10 deep breaths, practice coughing to be sure your lungs are clear. If you have an incision (the cut made at the time of surgery), support your incision when coughing by placing a pillow or rolled up towels firmly  against it. Once you are able to get out of bed, walk around indoors and cough well. You may stop using the incentive spirometer when instructed by your caregiver.  RISKS AND COMPLICATIONS  Take your time so you do not get dizzy or light-headed.  If you are in pain, you may need to take or ask for pain medication before doing incentive spirometry. It is harder to take a deep breath if you are having pain. AFTER USE  Rest and breathe slowly and easily.  It can be helpful to keep track of a log of your progress. Your caregiver can provide you with a simple table to help with this. If you are using the spirometer at home, follow these instructions: [place] IF:   You are having difficultly using the spirometer.  You have trouble using the spirometer as often as instructed.  Your pain medication is not giving enough relief while using the spirometer.  You develop fever of 100.5 F (38.1 C) or higher. SEEK IMMEDIATE MEDICAL CARE IF:   You cough up bloody sputum that had not been present before.  You develop fever of 102 F (38.9 C) or greater.  You develop worsening pain at or near the incision site. MAKE SURE YOU:   Understand these instructions.  Will watch your condition.  Will get help right away if you are not doing well or get worse. Document Released: 07/01/2006 Document Revised: 05/13/2011 Document Reviewed: 09/01/2006 [facility] Patient Information 2014 [place], [place].   ________________________________________________________________________

## 2019-08-13 ENCOUNTER — Encounter (HOSPITAL_COMMUNITY): Payer: Self-pay

## 2019-08-13 ENCOUNTER — Encounter (HOSPITAL_COMMUNITY)
Admission: RE | Admit: 2019-08-13 | Discharge: 2019-08-13 | Disposition: A | Discharge status: Home / Self Care | Payer: Medicare Other | Source: Ambulatory Visit | Attending: Orthopedic Surgery | Admitting: Orthopedic Surgery

## 2019-08-13 ENCOUNTER — Other Ambulatory Visit: Payer: Self-pay

## 2019-08-13 DIAGNOSIS — Z01818 Encounter for other preprocedural examination: Secondary | ICD-10-CM | POA: Diagnosis present

## 2019-08-13 NOTE — Progress Notes (Addendum)
COVID Vaccine Completed:yes Date COVID Vaccine completed:04/16/19 COVID vaccine manufacturer: *[place]   PCP - Dustin Folks: FNP. :Clearance: 06/09/19 Chart. Cardiologist -   Chest x-ray -  EKG - 02/15/19. EPIC Stress Test -  ECHO -  Cardiac Cath -   Sleep Study - yes CPAP - yes  Fasting Blood Sugar -  Checks Blood Sugar _____ times a day  Blood Thinner Instructions: Stop Aspirin five days before surgery. Aspirin Instructions:Dr. Aluisio Last Dose:  Anesthesia review:   Patient denies shortness of breath, fever, cough and chest pain at PAT appointment   Patient verbalized understanding of instructions that were given to them at the PAT appointment. Patient was also instructed that they will need to review over the PAT instructions again at home before surgery.

## 2019-08-16 ENCOUNTER — Encounter (HOSPITAL_COMMUNITY)
Admission: RE | Admit: 2019-08-16 | Discharge: 2019-08-16 | Disposition: A | Discharge status: Home / Self Care | Payer: Medicare Other | Source: Ambulatory Visit | Attending: Orthopedic Surgery | Admitting: Orthopedic Surgery

## 2019-08-16 ENCOUNTER — Other Ambulatory Visit: Payer: Self-pay

## 2019-08-16 DIAGNOSIS — Z01818 Encounter for other preprocedural examination: Secondary | ICD-10-CM | POA: Diagnosis not present

## 2019-08-16 LAB — COMPREHENSIVE METABOLIC PANEL
ALT: 23 U/L (ref 0–44)
AST: 23 U/L (ref 15–41)
Albumin: 4 g/dL (ref 3.5–5.0)
Alkaline Phosphatase: 73 U/L (ref 38–126)
Anion gap: 9 (ref 5–15)
BUN: 12 mg/dL (ref 8–23)
CO2: 27 mmol/L (ref 22–32)
Calcium: 9.3 mg/dL (ref 8.9–10.3)
Chloride: 105 mmol/L (ref 98–111)
Creatinine, Ser: 1.27 mg/dL — ABNORMAL HIGH (ref 0.61–1.24)
GFR calc Af Amer: >60 mL/min (ref >60)
GFR calc non Af Amer: 56 mL/min — ABNORMAL LOW (ref >60)
Glucose, Bld: 116 mg/dL — ABNORMAL HIGH (ref 70–99)
Potassium: 5 mmol/L (ref 3.5–5.1)
Sodium: 141 mmol/L (ref 135–145)
Total Bilirubin: 0.7 mg/dL (ref 0.3–1.2)
Total Protein: 7.4 g/dL (ref 6.5–8.1)

## 2019-08-16 LAB — CBC
HCT: 42.1 % (ref 39.0–52.0)
Hemoglobin: 13.5 g/dL (ref 13.0–17.0)
MCH: 30.8 pg (ref 26.0–34.0)
MCHC: 32.1 g/dL (ref 30.0–36.0)
MCV: 95.9 fL (ref 80.0–100.0)
Platelets: 259 10*3/uL (ref 150–400)
RBC: 4.39 MIL/uL (ref 4.22–5.81)
RDW: 15.6 % — ABNORMAL HIGH (ref 11.5–15.5)
WBC: 4.1 10*3/uL (ref 4.0–10.5)
nRBC: 0 % (ref 0.0–0.2)

## 2019-08-16 LAB — PROTIME-INR
INR: 0.9 (ref 0.8–1.2)
Prothrombin Time: 11.8 seconds (ref 11.4–15.2)

## 2019-08-16 LAB — SURGICAL PCR SCREEN
MRSA, PCR: NEGATIVE
Staphylococcus aureus: NEGATIVE

## 2019-08-16 LAB — APTT: aPTT: 27 seconds (ref 24–36)

## 2019-08-16 LAB — ABO/RH: ABO/RH(D): A POS

## 2019-08-18 NOTE — H&P (View-Only) (Signed)
TOTAL KNEE ADMISSION H&P  Patient is being admitted for right total knee arthroplasty.  Subjective:  Chief Complaint: Right knee pain.  HPI: Paul Moon, 74 y.o. male has a history of pain and functional disability in the right knee due to arthritis and has failed non-surgical conservative treatments for greater than 12 weeks to include corticosteriod injections, viscosupplementation injections and activity modification. Onset of symptoms was gradual, starting several years ago with gradually worsening course since that time. The patient noted prior procedures on the knee to include  arthroscopy and menisectomy on the right knee.  Patient currently rates pain in the right knee at 7 out of 10 with activity. Patient has worsening of pain with activity and weight bearing, pain that interferes with activities of daily living and crepitus. Patient has evidence of bone-on-bone medial compartment osteoarthritic change with moderately severe patellofemoral changes and moderate lateral compartment narrowing by imaging studies. There is no active infection.  Patient Active Problem List   Diagnosis Date Noted  . Mild cognitive impairment 08/07/2017  . Prostate cancer ([place]) 06/18/2016  . Meralgia paresthetica of right side 02/18/2014  . Acute medial meniscal tear 04/27/2013  . Obstructive sleep apnea 04/09/2013  . Tremor 03/22/2013  . Headache(784.0) 03/22/2013  . Cervical radiculopathy 07/10/2011    Past Medical History:  Diagnosis Date  . Arthritis    INDEX FINGER JOINT LEFT HAND, right knee  . Bipolar disorder ([place])   . Depression   . History of gout 2018   right hand 2 fingers and right wrist-- 05-20-2017 per pt resolved  . Hyperplasia of prostate with lower urinary tract symptoms (LUTS)   . Hypertension   . OSA on CPAP    per study 03-22-2013  moderate OSA w/ AHI 26.8/hr  . Pinched cervical nerve root    PT STATES C 7-8 PINCHED NERVE CAUSING NUMBNESS MIDDLE, RING AND LITTLE FINGER LEFT  HAND - NO PAIN - STATES HE IS TRYING TO AVOID SURGERY.  . Pre-diabetes    followed by pcp  . Prostate cancer [facility]) urologist-  dr Alyson Ingles  oncologist-  dr Tammi Klippel   dx 05-24-2016  Stage T1c, Gleason 4+3, PSA 5.49, vol 65.5cc/  pt seek second opinion's at [facility] and [facility] not candidate for focal laser ablation/  scheduled for radiactive seed implants 05-26-2017  . Wears glasses     Past Surgical History:  Procedure Laterality Date  . CIRCUMCISION  1973 approx.  Marland Kitchen KNEE ARTHROSCOPY Left 04/28/2013   Procedure: LEFT ARTHROSCOPY KNEE WITH DEBRIDEMENT meniscal debridement;  Surgeon: Gearlean Alf, MD;  Location: [facility];  Service: Orthopedics;  Laterality: Left;  . KNEE SURGERY Right 2008  . MR GUIDED PROSTATE BIOPSY  12-12-2016    [facility], [place]   w/ general anesthesia  . PROSTATE BIOPSY  05-29-2016   dr Alyson Ingles office  . RADIOACTIVE SEED IMPLANT N/A 05/26/2017   Procedure: RADIOACTIVE SEED IMPLANT/BRACHYTHERAPY IMPLANT;  Surgeon: Cleon Gustin, MD;  Location: [facility];  Service: Urology;  Laterality: N/A;  . SPACE OAR INSTILLATION N/A 05/26/2017   Procedure: SPACE OAR INSTILLATION;  Surgeon: Cleon Gustin, MD;  Location: [facility];  Service: Urology;  Laterality: N/A;  . TONSILLECTOMY  1970s    Prior to Admission medications   Medication Sig Start Date End Date Taking? Authorizing Provider  allopurinol (ZYLOPRIM) 300 MG tablet Take 300 mg by mouth daily.   Yes [provider]  aspirin EC 81 MG tablet Take 81 mg  by mouth daily.   Yes [provider]  Black Pepper-Turmeric 07-998 MG CAPS Take 1 capsule by mouth daily.   Yes [provider]  Cholecalciferol (VITAMIN D) 50 MCG (2000 UT) CAPS Take 2,000 Units by mouth daily.   Yes [provider]  donepezil (ARICEPT) 10 MG tablet Take 1 tablet (10 mg total) by mouth at bedtime. Future refills to be managed by PCP. Patient taking differently:  Take 10 mg by mouth at bedtime.  05/04/19  Yes Ward Givens, NP  finasteride (PROSCAR) 5 MG tablet Take 5 mg by mouth daily.    Yes [provider]  losartan (COZAAR) 100 MG tablet Take 100 mg by mouth at bedtime.    Yes [provider]  magnesium oxide (MAG-OX) 400 MG tablet Take 400 mg by mouth daily.   Yes [provider]  memantine (NAMENDA) 10 MG tablet Take 1 tablet (10 mg total) by mouth 2 (two) times daily. 05/04/19  Yes Ward Givens, NP  mirabegron ER (MYRBETRIQ) 50 MG TB24 tablet Take 50 mg by mouth daily.    Yes [provider]  Potassium 99 MG TABS Take 99 mg by mouth daily.   Yes [provider]  RESVERATROL PO Take 1 capsule by mouth in the morning and at bedtime.   Yes [provider]  tadalafil (CIALIS) 20 MG tablet Take 20 mg by mouth daily as needed for erectile dysfunction.    Yes [provider]  tamsulosin (FLOMAX) 0.4 MG CAPS capsule Take 0.4 mg by mouth daily.  09/11/17  Yes [provider]  UNABLE TO FIND Take 500 mg by mouth in the morning and at bedtime. NMN Nicotinamide mononucleotide    Yes [provider]    Allergies  Allergen Reactions  . Hctz [Hydrochlorothiazide]     Unknown reaction  . Lactose Intolerance (Gi)   . Lisinopril Cough  . Quetiapine Fumarate Er Other (See Comments)  . Risperdal [Risperidone] Other (See Comments)    "not be lucid, be foggy"  . Seroquel [Quetiapine Fumarate] Other (See Comments)    Joint swelling    Social History   Socioeconomic History  . Marital status: Divorced    Spouse name: Not on file  . Number of children: 2  . Years of education: College  . Highest education level: Not on file  Occupational History  . Occupation: Semi-Retired  Tobacco Use  . Smoking status: Former Smoker    Packs/day: 1.50    Years: 20.00    Pack years: 30.00    Types: Cigarettes    Quit date: 05/26/1985    Years since quitting: 34.2  . Smokeless tobacco:  Never Used  Vaping Use  . Vaping Use: Never used  Substance and Sexual Activity  . Alcohol use: Yes    Comment: occasional wine  . Drug use: No  . Sexual activity: Yes  Other Topics Concern  . Not on file  Social History Narrative   Patient lives at home alone.   Caffeine Use: occasionally   Social Determinants of Health   Financial Resource Strain:   . Difficulty of Paying Living Expenses:   Food Insecurity:   . Worried About Charity fundraiser in the Last Year:   . Arboriculturist in the Last Year:   Transportation Needs:   . Film/video editor (Medical):   Marland Kitchen Lack of Transportation (Non-Medical):   Physical Activity:   . Days of Exercise per Week:   .  Minutes of Exercise per Session:   Stress:   . Feeling of Stress :   Social Connections:   . Frequency of Communication with Friends and Family:   . Frequency of Social Gatherings with Friends and Family:   . Attends Religious Services:   . Active Member of Clubs or Organizations:   . Attends Archivist Meetings:   Marland Kitchen Marital Status:   Intimate Partner Violence:   . Fear of Current or Ex-Partner:   . Emotionally Abused:   Marland Kitchen Physically Abused:   . Sexually Abused:       Tobacco Use: Medium Risk  . Smoking Tobacco Use: Former Smoker  . Smokeless Tobacco Use: Never Used   Social History   Substance and Sexual Activity  Alcohol Use Yes   Comment: occasional wine    Family History  Problem Relation Age of Onset  . Renal cancer Father   . Heart attack Paternal Grandmother   . Heart attack Paternal Grandfather   . Cancer Paternal Aunt        breast    Review of Systems  Constitutional: Negative for chills and fever.  HENT: Negative for congestion, sore throat and tinnitus.   Eyes: Negative for double vision, photophobia and pain.  Respiratory: Negative for cough, shortness of breath and wheezing.   Cardiovascular: Negative for chest pain, palpitations and orthopnea.  Gastrointestinal:  Negative for heartburn, nausea and vomiting.  Genitourinary: Negative for dysuria, frequency and urgency.  Musculoskeletal: Positive for joint pain.  Neurological: Negative for dizziness, weakness and headaches.    Objective:  Physical Exam: Well nourished and well developed.  General: Alert and oriented x3, cooperative and pleasant, no acute distress.  Head: normocephalic, atraumatic, neck supple.  Eyes: EOMI.  Respiratory: breath sounds clear in all fields, no wheezing, rales, or rhonchi. Cardiovascular: Regular rate and rhythm, no murmurs, gallops or rubs.  Abdomen: non-tender to palpation and soft, normoactive bowel sounds. Musculoskeletal:  Right Knee Exam: He shows a varus deformity of both knees. No effusion. Tenderness medial joint line of the right knee pain on medial McMurray's but no clunk. No ligamentous instability to collateral or cruciate testing. Moderate patellofemoral crepitation is noted. 3 from full extension with further flexion of 120.  Calves soft and nontender. Motor function intact in LE. Strength 5/5 LE bilaterally. Neuro: Distal pulses 2+. Sensation to light touch intact in LE.  Imaging Review Plain radiographs demonstrate severe degenerative joint disease of the right knee. The overall alignment is significant varus. The bone quality appears to be adequate for age and reported activity level.  Assessment/Plan:  End stage arthritis, right knee   The patient history, physical examination, clinical judgment of the provider and imaging studies are consistent with end stage degenerative joint disease of the right knee and total knee arthroplasty is deemed medically necessary. The treatment options including medical management, injection therapy arthroscopy and arthroplasty were discussed at length. The risks and benefits of total knee arthroplasty were presented and reviewed. The risks due to aseptic loosening, infection, stiffness, patella tracking problems,  thromboembolic complications and other imponderables were discussed. The patient acknowledged the explanation, agreed to proceed with the plan and consent was signed. Patient is being admitted for inpatient treatment for surgery, pain control, PT, OT, prophylactic antibiotics, VTE prophylaxis, progressive ambulation and ADLs and discharge planning. The patient is planning to be discharged home.   Patient's anticipated LOS is less than 2 midnights, meeting these requirements: - Younger than 65 - Lives within 1 hour  of care - Has a competent adult at home to recover with post-op recover - NO history of  - Chronic pain requiring opiods  - Diabetes  - Coronary Artery Disease  - Heart failure  - Heart attack  - Stroke  - DVT/VTE  - Cardiac arrhythmia  - Respiratory Failure/COPD  - Renal failure  - Anemia  - Advanced Liver disease  Therapy Plans: [facility]patient therapy at [facility]) Disposition: Home with mom and brothers Planned DVT Prophylaxis: Xarelto 10 mg QD  DME Needed: Gilford Rile PCP: Dustin Folks, MD (clearance provided) Neurologist: Marcial Pacas, MD (clearance provided) TXA: IV Allergies: Lisinopril, seroquel Anesthesia Concerns: None BMI: 32.2  Other:  - Possible SDD - History prostate CA  - Patient was instructed on what medications to stop prior to surgery. - Follow-up visit in 2 weeks with Dr. Wynelle Link - Begin physical therapy following surgery - Pre-operative lab work as pre-surgical testing - Prescriptions will be provided in hospital at time of discharge  Theresa Duty, PA-C Orthopedic Surgery [facility] [place]

## 2019-08-18 NOTE — H&P (Signed)
TOTAL KNEE ADMISSION H&P  Patient is being admitted for right total knee arthroplasty.  Subjective:  Chief Complaint: Right knee pain.  HPI: Paul Moon, 74 y.o. male has a history of pain and functional disability in the right knee due to arthritis and has failed non-surgical conservative treatments for greater than 12 weeks to include corticosteriod injections, viscosupplementation injections and activity modification. Onset of symptoms was gradual, starting several years ago with gradually worsening course since that time. The patient noted prior procedures on the knee to include  arthroscopy and menisectomy on the right knee.  Patient currently rates pain in the right knee at 7 out of 10 with activity. Patient has worsening of pain with activity and weight bearing, pain that interferes with activities of daily living and crepitus. Patient has evidence of bone-on-bone medial compartment osteoarthritic change with moderately severe patellofemoral changes and moderate lateral compartment narrowing by imaging studies. There is no active infection.  Patient Active Problem List   Diagnosis Date Noted  . Mild cognitive impairment 08/07/2017  . Prostate cancer ([place]) 06/18/2016  . Meralgia paresthetica of right side 02/18/2014  . Acute medial meniscal tear 04/27/2013  . Obstructive sleep apnea 04/09/2013  . Tremor 03/22/2013  . Headache(784.0) 03/22/2013  . Cervical radiculopathy 07/10/2011    Past Medical History:  Diagnosis Date  . Arthritis    INDEX FINGER JOINT LEFT HAND, right knee  . Bipolar disorder ([place])   . Depression   . History of gout 2018   right hand 2 fingers and right wrist-- 05-20-2017 per pt resolved  . Hyperplasia of prostate with lower urinary tract symptoms (LUTS)   . Hypertension   . OSA on CPAP    per study 03-22-2013  moderate OSA w/ AHI 26.8/hr  . Pinched cervical nerve root    PT STATES C 7-8 PINCHED NERVE CAUSING NUMBNESS MIDDLE, RING AND LITTLE FINGER LEFT  HAND - NO PAIN - STATES HE IS TRYING TO AVOID SURGERY.  . Pre-diabetes    followed by pcp  . Prostate cancer [facility]) urologist-  dr Alyson Ingles  oncologist-  dr Tammi Klippel   dx 05-24-2016  Stage T1c, Gleason 4+3, PSA 5.49, vol 65.5cc/  pt seek second opinion's at [facility] and [facility] not candidate for focal laser ablation/  scheduled for radiactive seed implants 05-26-2017  . Wears glasses     Past Surgical History:  Procedure Laterality Date  . CIRCUMCISION  1973 approx.  Marland Kitchen KNEE ARTHROSCOPY Left 04/28/2013   Procedure: LEFT ARTHROSCOPY KNEE WITH DEBRIDEMENT meniscal debridement;  Surgeon: Gearlean Alf, MD;  [facility];  Service: Orthopedics;  Laterality: Left;  . KNEE SURGERY Right 2008  . MR GUIDED PROSTATE BIOPSY  12-12-2016    [facility], [place]   w/ general anesthesia  . PROSTATE BIOPSY  05-29-2016   dr Alyson Ingles office  . RADIOACTIVE SEED IMPLANT N/A 05/26/2017   Procedure: RADIOACTIVE SEED IMPLANT/BRACHYTHERAPY IMPLANT;  Surgeon: Cleon Gustin, MD;  [facility];  Service: Urology;  Laterality: N/A;  . SPACE OAR INSTILLATION N/A 05/26/2017   Procedure: SPACE OAR INSTILLATION;  Surgeon: Cleon Gustin, MD;  Location: [facility];  Service: Urology;  Laterality: N/A;  . TONSILLECTOMY  1970s    Prior to Admission medications   Medication Sig Start Date End Date Taking? Authorizing Provider  allopurinol (ZYLOPRIM) 300 MG tablet Take 300 mg by mouth daily.   Yes [provider]  aspirin EC 81 MG tablet Take 81 mg  by mouth daily.   Yes [provider]  Black Pepper-Turmeric 07-998 MG CAPS Take 1 capsule by mouth daily.   Yes [provider]  Cholecalciferol (VITAMIN D) 50 MCG (2000 UT) CAPS Take 2,000 Units by mouth daily.   Yes [provider]  donepezil (ARICEPT) 10 MG tablet Take 1 tablet (10 mg total) by mouth at bedtime. Future refills to be managed by PCP. Patient taking differently:  Take 10 mg by mouth at bedtime.  05/04/19  Yes Ward Givens, NP  finasteride (PROSCAR) 5 MG tablet Take 5 mg by mouth daily.    Yes [provider]  losartan (COZAAR) 100 MG tablet Take 100 mg by mouth at bedtime.    Yes [provider]  magnesium oxide (MAG-OX) 400 MG tablet Take 400 mg by mouth daily.   Yes [provider]  memantine (NAMENDA) 10 MG tablet Take 1 tablet (10 mg total) by mouth 2 (two) times daily. 05/04/19  Yes Ward Givens, NP  mirabegron ER (MYRBETRIQ) 50 MG TB24 tablet Take 50 mg by mouth daily.    Yes [provider]  Potassium 99 MG TABS Take 99 mg by mouth daily.   Yes [provider]  RESVERATROL PO Take 1 capsule by mouth in the morning and at bedtime.   Yes [provider]  tadalafil (CIALIS) 20 MG tablet Take 20 mg by mouth daily as needed for erectile dysfunction.    Yes [provider]  tamsulosin (FLOMAX) 0.4 MG CAPS capsule Take 0.4 mg by mouth daily.  09/11/17  Yes [provider]  UNABLE TO FIND Take 500 mg by mouth in the morning and at bedtime. NMN Nicotinamide mononucleotide    Yes [provider]    Allergies  Allergen Reactions  . Hctz [Hydrochlorothiazide]     Unknown reaction  . Lactose Intolerance (Gi)   . Lisinopril Cough  . Quetiapine Fumarate Er Other (See Comments)  . Risperdal [Risperidone] Other (See Comments)    "not be lucid, be foggy"  . Seroquel [Quetiapine Fumarate] Other (See Comments)    Joint swelling    Social History   Socioeconomic History  . Marital status: Divorced    Spouse name: Not on file  . Number of children: 2  . Years of education: College  . Highest education level: Not on file  Occupational History  . Occupation: Semi-Retired  Tobacco Use  . Smoking status: Former Smoker    Packs/day: 1.50    Years: 20.00    Pack years: 30.00    Types: Cigarettes    Quit date: 05/26/1985    Years since quitting: 34.2  . Smokeless tobacco:  Never Used  Vaping Use  . Vaping Use: Never used  Substance and Sexual Activity  . Alcohol use: Yes    Comment: occasional wine  . Drug use: No  . Sexual activity: Yes  Other Topics Concern  . Not on file  Social History Narrative   Patient lives at home alone.   Caffeine Use: occasionally   Social Determinants of Health   Financial Resource Strain:   . Difficulty of Paying Living Expenses:   Food Insecurity:   . Worried About Charity fundraiser in the Last Year:   . Arboriculturist in the Last Year:   Transportation Needs:   . Film/video editor (Medical):   Marland Kitchen Lack of Transportation (Non-Medical):   Physical Activity:   . Days of Exercise per Week:   .  Minutes of Exercise per Session:   Stress:   . Feeling of Stress :   Social Connections:   . Frequency of Communication with Friends and Family:   . Frequency of Social Gatherings with Friends and Family:   . Attends Religious Services:   . Active Member of Clubs or Organizations:   . Attends Archivist Meetings:   Marland Kitchen Marital Status:   Intimate Partner Violence:   . Fear of Current or Ex-Partner:   . Emotionally Abused:   Marland Kitchen Physically Abused:   . Sexually Abused:       Tobacco Use: Medium Risk  . Smoking Tobacco Use: Former Smoker  . Smokeless Tobacco Use: Never Used   Social History   Substance and Sexual Activity  Alcohol Use Yes   Comment: occasional wine    Family History  Problem Relation Age of Onset  . Renal cancer Father   . Heart attack Paternal Grandmother   . Heart attack Paternal Grandfather   . Cancer Paternal Aunt        breast    Review of Systems  Constitutional: Negative for chills and fever.  HENT: Negative for congestion, sore throat and tinnitus.   Eyes: Negative for double vision, photophobia and pain.  Respiratory: Negative for cough, shortness of breath and wheezing.   Cardiovascular: Negative for chest pain, palpitations and orthopnea.  Gastrointestinal:  Negative for heartburn, nausea and vomiting.  Genitourinary: Negative for dysuria, frequency and urgency.  Musculoskeletal: Positive for joint pain.  Neurological: Negative for dizziness, weakness and headaches.    Objective:  Physical Exam: Well nourished and well developed.  General: Alert and oriented x3, cooperative and pleasant, no acute distress.  Head: normocephalic, atraumatic, neck supple.  Eyes: EOMI.  Respiratory: breath sounds clear in all fields, no wheezing, rales, or rhonchi. Cardiovascular: Regular rate and rhythm, no murmurs, gallops or rubs.  Abdomen: non-tender to palpation and soft, normoactive bowel sounds. Musculoskeletal:  Right Knee Exam: He shows a varus deformity of both knees. No effusion. Tenderness medial joint line of the right knee pain on medial McMurray's but no clunk. No ligamentous instability to collateral or cruciate testing. Moderate patellofemoral crepitation is noted. 3 from full extension with further flexion of 120.  Calves soft and nontender. Motor function intact in LE. Strength 5/5 LE bilaterally. Neuro: Distal pulses 2+. Sensation to light touch intact in LE.  Imaging Review Plain radiographs demonstrate severe degenerative joint disease of the right knee. The overall alignment is significant varus. The bone quality appears to be adequate for age and reported activity level.  Assessment/Plan:  End stage arthritis, right knee   The patient history, physical examination, clinical judgment of the provider and imaging studies are consistent with end stage degenerative joint disease of the right knee and total knee arthroplasty is deemed medically necessary. The treatment options including medical management, injection therapy arthroscopy and arthroplasty were discussed at length. The risks and benefits of total knee arthroplasty were presented and reviewed. The risks due to aseptic loosening, infection, stiffness, patella tracking problems,  thromboembolic complications and other imponderables were discussed. The patient acknowledged the explanation, agreed to proceed with the plan and consent was signed. Patient is being admitted for inpatient treatment for surgery, pain control, PT, OT, prophylactic antibiotics, VTE prophylaxis, progressive ambulation and ADLs and discharge planning. The patient is planning to be discharged home.   Patient's anticipated LOS is less than 2 midnights, meeting these requirements: - Younger than 60 - Lives within 1 hour  of care - Has a competent adult at home to recover with post-op recover - NO history of  - Chronic pain requiring opiods  - Diabetes  - Coronary Artery Disease  - Heart failure  - Heart attack  - Stroke  - DVT/VTE  - Cardiac arrhythmia  - Respiratory Failure/COPD  - Renal failure  - Anemia  - Advanced Liver disease  Therapy Plans: Outpatient therapy at [facility]) Disposition: Home with mom and brothers Planned DVT Prophylaxis: Xarelto 10 mg QD  DME Needed: Gilford Rile PCP: Dustin Folks, MD (clearance provided) Neurologist: Marcial Pacas, MD (clearance provided) TXA: IV Allergies: Lisinopril, seroquel Anesthesia Concerns: None BMI: 32.2  Other:  - Possible SDD - History prostate CA  - Patient was instructed on what medications to stop prior to surgery. - Follow-up visit in 2 weeks with Dr. Wynelle Link - Begin physical therapy following surgery - Pre-operative lab work as pre-surgical testing - Prescriptions will be provided in hospital at time of discharge  Theresa Duty, PA-C Orthopedic Surgery [facility] [place]

## 2019-08-19 ENCOUNTER — Other Ambulatory Visit (HOSPITAL_COMMUNITY)
Admission: RE | Admit: 2019-08-19 | Discharge: 2019-08-19 | Disposition: A | Discharge status: Home / Self Care | Payer: Medicare Other | Source: Ambulatory Visit | Attending: Orthopedic Surgery | Admitting: Orthopedic Surgery

## 2019-08-19 DIAGNOSIS — Z01812 Encounter for preprocedural laboratory examination: Secondary | ICD-10-CM | POA: Insufficient documentation

## 2019-08-19 DIAGNOSIS — Z20822 Contact with and (suspected) exposure to covid-19: Secondary | ICD-10-CM | POA: Diagnosis not present

## 2019-08-19 LAB — SARS CORONAVIRUS 2 (TAT 6-24 HRS): SARS Coronavirus 2: NEGATIVE

## 2019-08-22 MED ORDER — BUPIVACAINE LIPOSOME 1.3 % IJ SUSP
20.0000 mL | INTRAMUSCULAR | Status: DC
  Filled 2019-08-22: qty 20

## 2019-08-23 ENCOUNTER — Encounter (HOSPITAL_COMMUNITY)
Admission: RE | Disposition: A | Discharge status: Home / Self Care | Payer: Self-pay | Source: Home / Self Care | Attending: Orthopedic Surgery

## 2019-08-23 ENCOUNTER — Other Ambulatory Visit: Payer: Self-pay

## 2019-08-23 ENCOUNTER — Ambulatory Visit (HOSPITAL_COMMUNITY): Payer: Medicare Other | Admitting: Anesthesiology

## 2019-08-23 ENCOUNTER — Encounter (HOSPITAL_COMMUNITY): Payer: Self-pay | Admitting: Orthopedic Surgery

## 2019-08-23 ENCOUNTER — Ambulatory Visit (HOSPITAL_COMMUNITY)
Admission: RE | Admit: 2019-08-23 | Discharge: 2019-08-24 | Disposition: A | Discharge status: Home / Self Care | Payer: Medicare Other | Attending: Orthopedic Surgery | Admitting: Orthopedic Surgery

## 2019-08-23 ENCOUNTER — Ambulatory Visit (HOSPITAL_COMMUNITY): Admission: RE | Admit: 2019-08-23 | Payer: Medicare Other | Source: Home / Self Care | Admitting: Orthopedic Surgery

## 2019-08-23 DIAGNOSIS — Z8546 Personal history of malignant neoplasm of prostate: Secondary | ICD-10-CM | POA: Insufficient documentation

## 2019-08-23 DIAGNOSIS — G4733 Obstructive sleep apnea (adult) (pediatric): Secondary | ICD-10-CM | POA: Insufficient documentation

## 2019-08-23 DIAGNOSIS — M179 Osteoarthritis of knee, unspecified: Secondary | ICD-10-CM

## 2019-08-23 DIAGNOSIS — Z7982 Long term (current) use of aspirin: Secondary | ICD-10-CM | POA: Insufficient documentation

## 2019-08-23 DIAGNOSIS — Z888 Allergy status to other drugs, medicaments and biological substances status: Secondary | ICD-10-CM | POA: Insufficient documentation

## 2019-08-23 DIAGNOSIS — M1710 Unilateral primary osteoarthritis, unspecified knee: Secondary | ICD-10-CM | POA: Diagnosis present

## 2019-08-23 DIAGNOSIS — I10 Essential (primary) hypertension: Secondary | ICD-10-CM | POA: Insufficient documentation

## 2019-08-23 DIAGNOSIS — Z87891 Personal history of nicotine dependence: Secondary | ICD-10-CM | POA: Insufficient documentation

## 2019-08-23 DIAGNOSIS — Z79899 Other long term (current) drug therapy: Secondary | ICD-10-CM | POA: Diagnosis not present

## 2019-08-23 DIAGNOSIS — M1711 Unilateral primary osteoarthritis, right knee: Secondary | ICD-10-CM | POA: Insufficient documentation

## 2019-08-23 DIAGNOSIS — G3184 Mild cognitive impairment, so stated: Secondary | ICD-10-CM | POA: Diagnosis not present

## 2019-08-23 HISTORY — PX: TOTAL KNEE ARTHROPLASTY: SHX125

## 2019-08-23 LAB — TYPE AND SCREEN
ABO/RH(D): A POS
Antibody Screen: NEGATIVE

## 2019-08-23 SURGERY — ARTHROPLASTY, KNEE, TOTAL
Anesthesia: Choice | Site: Knee | Laterality: Right

## 2019-08-23 SURGERY — ARTHROPLASTY, KNEE, TOTAL
Anesthesia: Spinal | Site: Knee

## 2019-08-23 MED ORDER — BISACODYL 10 MG RE SUPP: 10.0000 mg | Freq: Every day | RECTAL | Status: DC | PRN

## 2019-08-23 MED ORDER — BUPIVACAINE IN DEXTROSE 0.75-8.25 % IT SOLN
INTRATHECAL | Status: DC | PRN
  Administered 2019-08-23: 1.6 mL via INTRATHECAL

## 2019-08-23 MED ORDER — MORPHINE SULFATE (PF) 2 MG/ML IV SOLN
0.5000 mg | INTRAVENOUS | Status: DC | PRN
  Administered 2019-08-24: 0.5 mg via INTRAVENOUS
  Filled 2019-08-23: qty 1

## 2019-08-23 MED ORDER — ONDANSETRON HCL 4 MG/2ML IJ SOLN
INTRAMUSCULAR | Status: AC
  Filled 2019-08-23: qty 2

## 2019-08-23 MED ORDER — TAMSULOSIN HCL 0.4 MG PO CAPS
0.4000 mg | ORAL_CAPSULE | Freq: Every day | ORAL | Status: DC
  Administered 2019-08-24: 0.4 mg via ORAL
  Filled 2019-08-23: qty 1

## 2019-08-23 MED ORDER — ORAL CARE MOUTH RINSE
15.0000 mL | Freq: Once | OROMUCOSAL | Status: AC
  Administered 2019-08-23: 15 mL via OROMUCOSAL

## 2019-08-23 MED ORDER — DEXAMETHASONE SODIUM PHOSPHATE 10 MG/ML IJ SOLN
INTRAMUSCULAR | Status: AC
  Filled 2019-08-23: qty 1

## 2019-08-23 MED ORDER — PROPOFOL 500 MG/50ML IV EMUL
INTRAVENOUS | Status: AC
  Filled 2019-08-23: qty 50

## 2019-08-23 MED ORDER — ONDANSETRON HCL 4 MG/2ML IJ SOLN
INTRAMUSCULAR | Status: DC | PRN
  Administered 2019-08-23: 4 mg via INTRAVENOUS

## 2019-08-23 MED ORDER — POVIDONE-IODINE 10 % EX SWAB
2.0000 "application " | Freq: Once | CUTANEOUS | Status: AC
  Administered 2019-08-23: 2 via TOPICAL

## 2019-08-23 MED ORDER — STERILE WATER FOR IRRIGATION IR SOLN
Status: DC | PRN
  Administered 2019-08-23: 2000 mL

## 2019-08-23 MED ORDER — DOCUSATE SODIUM 100 MG PO CAPS
100.0000 mg | ORAL_CAPSULE | Freq: Two times a day (BID) | ORAL | Status: DC
  Administered 2019-08-23 – 2019-08-24 (×2): 100 mg via ORAL
  Filled 2019-08-23 (×2): qty 1

## 2019-08-23 MED ORDER — OXYCODONE HCL 5 MG PO TABS
5.0000 mg | ORAL_TABLET | ORAL | Status: DC | PRN
  Administered 2019-08-23 – 2019-08-24 (×4): 5 mg via ORAL
  Filled 2019-08-23: qty 1
  Filled 2019-08-23: qty 2
  Filled 2019-08-23 (×2): qty 1
  Filled 2019-08-23: qty 2

## 2019-08-23 MED ORDER — CHLORHEXIDINE GLUCONATE 0.12 % MT SOLN: 15.0000 mL | Freq: Once | OROMUCOSAL | Status: AC

## 2019-08-23 MED ORDER — MIRABEGRON ER 25 MG PO TB24
50.0000 mg | ORAL_TABLET | Freq: Every day | ORAL | Status: DC
  Administered 2019-08-24: 50 mg via ORAL
  Filled 2019-08-23: qty 2

## 2019-08-23 MED ORDER — PHENOL 1.4 % MT LIQD: 1.0000 | OROMUCOSAL | Status: DC | PRN

## 2019-08-23 MED ORDER — FINASTERIDE 5 MG PO TABS
5.0000 mg | ORAL_TABLET | Freq: Every day | ORAL | Status: DC
  Administered 2019-08-24: 5 mg via ORAL
  Filled 2019-08-23: qty 1

## 2019-08-23 MED ORDER — TRAMADOL HCL 50 MG PO TABS
50.0000 mg | ORAL_TABLET | Freq: Four times a day (QID) | ORAL | Status: DC | PRN
  Administered 2019-08-23 – 2019-08-24 (×3): 100 mg via ORAL
  Filled 2019-08-23 (×3): qty 2

## 2019-08-23 MED ORDER — METOCLOPRAMIDE HCL 5 MG PO TABS: 5.0000 mg | ORAL_TABLET | Freq: Three times a day (TID) | ORAL | Status: DC | PRN

## 2019-08-23 MED ORDER — POTASSIUM 99 MG PO TABS: 99.0000 mg | ORAL_TABLET | Freq: Every day | ORAL | Status: DC

## 2019-08-23 MED ORDER — ONDANSETRON HCL 4 MG PO TABS: 4.0000 mg | ORAL_TABLET | Freq: Four times a day (QID) | ORAL | Status: DC | PRN

## 2019-08-23 MED ORDER — CEFAZOLIN SODIUM-DEXTROSE 2-4 GM/100ML-% IV SOLN
2.0000 g | Freq: Four times a day (QID) | INTRAVENOUS | Status: AC
  Administered 2019-08-23 (×2): 2 g via INTRAVENOUS
  Filled 2019-08-23 (×2): qty 100

## 2019-08-23 MED ORDER — CEFAZOLIN SODIUM-DEXTROSE 2-4 GM/100ML-% IV SOLN
2.0000 g | INTRAVENOUS | Status: AC
  Administered 2019-08-23: 2 g via INTRAVENOUS
  Filled 2019-08-23: qty 100

## 2019-08-23 MED ORDER — MIDAZOLAM HCL 2 MG/2ML IJ SOLN: 1.0000 mg | INTRAMUSCULAR | Status: DC

## 2019-08-23 MED ORDER — ACETAMINOPHEN 10 MG/ML IV SOLN
INTRAVENOUS | Status: DC | PRN
  Administered 2019-08-23: 1000 mg via INTRAVENOUS

## 2019-08-23 MED ORDER — FLEET ENEMA 7-19 GM/118ML RE ENEM: 1.0000 | ENEMA | Freq: Once | RECTAL | Status: DC | PRN

## 2019-08-23 MED ORDER — PROPOFOL 10 MG/ML IV BOLUS
INTRAVENOUS | Status: AC
  Filled 2019-08-23: qty 40

## 2019-08-23 MED ORDER — 0.9 % SODIUM CHLORIDE (POUR BTL) OPTIME
TOPICAL | Status: DC | PRN
  Administered 2019-08-23: 1000 mL

## 2019-08-23 MED ORDER — LOSARTAN POTASSIUM 50 MG PO TABS: 100.0000 mg | ORAL_TABLET | Freq: Every day | ORAL | Status: DC

## 2019-08-23 MED ORDER — ONDANSETRON HCL 4 MG/2ML IJ SOLN: 4.0000 mg | Freq: Four times a day (QID) | INTRAMUSCULAR | Status: DC | PRN

## 2019-08-23 MED ORDER — MIDAZOLAM HCL 2 MG/2ML IJ SOLN
INTRAMUSCULAR | Status: AC
  Administered 2019-08-23: 2 mg via INTRAVENOUS
  Filled 2019-08-23: qty 2

## 2019-08-23 MED ORDER — MIDAZOLAM HCL 2 MG/2ML IJ SOLN
INTRAMUSCULAR | Status: AC
  Filled 2019-08-23: qty 2

## 2019-08-23 MED ORDER — POLYETHYLENE GLYCOL 3350 17 G PO PACK: 17.0000 g | PACK | Freq: Every day | ORAL | Status: DC | PRN

## 2019-08-23 MED ORDER — MEMANTINE HCL 10 MG PO TABS
10.0000 mg | ORAL_TABLET | Freq: Two times a day (BID) | ORAL | Status: DC
  Administered 2019-08-23 – 2019-08-24 (×2): 10 mg via ORAL
  Filled 2019-08-23 (×2): qty 1

## 2019-08-23 MED ORDER — SODIUM CHLORIDE 0.9 % IR SOLN
Status: DC | PRN
  Administered 2019-08-23: 1000 mL

## 2019-08-23 MED ORDER — SODIUM CHLORIDE (PF) 0.9 % IJ SOLN
INTRAMUSCULAR | Status: DC | PRN
  Administered 2019-08-23: 60 mL

## 2019-08-23 MED ORDER — BUPIVACAINE LIPOSOME 1.3 % IJ SUSP
INTRAMUSCULAR | Status: DC | PRN
  Administered 2019-08-23: 20 mL

## 2019-08-23 MED ORDER — LACTATED RINGERS IV SOLN: INTRAVENOUS | Status: DC

## 2019-08-23 MED ORDER — METHOCARBAMOL 500 MG IVPB - SIMPLE MED
500.0000 mg | Freq: Four times a day (QID) | INTRAVENOUS | Status: DC | PRN
  Filled 2019-08-23: qty 50

## 2019-08-23 MED ORDER — PROPOFOL 500 MG/50ML IV EMUL
INTRAVENOUS | Status: DC | PRN
  Administered 2019-08-23: 50 ug/kg/min via INTRAVENOUS

## 2019-08-23 MED ORDER — RIVAROXABAN 10 MG PO TABS
10.0000 mg | ORAL_TABLET | Freq: Every day | ORAL | Status: DC
  Administered 2019-08-24: 10 mg via ORAL
  Filled 2019-08-23: qty 1

## 2019-08-23 MED ORDER — DONEPEZIL HCL 10 MG PO TABS
10.0000 mg | ORAL_TABLET | Freq: Every day | ORAL | Status: DC
  Administered 2019-08-23: 10 mg via ORAL
  Filled 2019-08-23: qty 1

## 2019-08-23 MED ORDER — ACETAMINOPHEN 500 MG PO TABS
1000.0000 mg | ORAL_TABLET | Freq: Four times a day (QID) | ORAL | Status: AC
  Administered 2019-08-23 – 2019-08-24 (×4): 1000 mg via ORAL
  Filled 2019-08-23 (×4): qty 2

## 2019-08-23 MED ORDER — SODIUM CHLORIDE 0.9 % IV SOLN: INTRAVENOUS | Status: DC

## 2019-08-23 MED ORDER — ALLOPURINOL 300 MG PO TABS
300.0000 mg | ORAL_TABLET | Freq: Every day | ORAL | Status: DC
  Administered 2019-08-24: 300 mg via ORAL
  Filled 2019-08-23: qty 1

## 2019-08-23 MED ORDER — PROPOFOL 10 MG/ML IV BOLUS
INTRAVENOUS | Status: DC | PRN
  Administered 2019-08-23: 40 mg via INTRAVENOUS

## 2019-08-23 MED ORDER — DEXAMETHASONE SODIUM PHOSPHATE 10 MG/ML IJ SOLN: 10.0000 mg | Freq: Once | INTRAMUSCULAR | Status: DC

## 2019-08-23 MED ORDER — TRANEXAMIC ACID-NACL 1000-0.7 MG/100ML-% IV SOLN
1000.0000 mg | INTRAVENOUS | Status: AC
  Administered 2019-08-23: 1000 mg via INTRAVENOUS
  Filled 2019-08-23: qty 100

## 2019-08-23 MED ORDER — FENTANYL CITRATE (PF) 100 MCG/2ML IJ SOLN
INTRAMUSCULAR | Status: AC
  Administered 2019-08-23: 50 ug via INTRAVENOUS
  Filled 2019-08-23: qty 2

## 2019-08-23 MED ORDER — METOCLOPRAMIDE HCL 5 MG/ML IJ SOLN: 5.0000 mg | Freq: Three times a day (TID) | INTRAMUSCULAR | Status: DC | PRN

## 2019-08-23 MED ORDER — FENTANYL CITRATE (PF) 100 MCG/2ML IJ SOLN
INTRAMUSCULAR | Status: AC
  Filled 2019-08-23: qty 2

## 2019-08-23 MED ORDER — METHOCARBAMOL 500 MG PO TABS
500.0000 mg | ORAL_TABLET | Freq: Four times a day (QID) | ORAL | Status: DC | PRN
  Administered 2019-08-23 – 2019-08-24 (×4): 500 mg via ORAL
  Filled 2019-08-23 (×4): qty 1

## 2019-08-23 MED ORDER — EPHEDRINE SULFATE-NACL 50-0.9 MG/10ML-% IV SOSY
PREFILLED_SYRINGE | INTRAVENOUS | Status: DC | PRN
  Administered 2019-08-23 (×3): 10 mg via INTRAVENOUS

## 2019-08-23 MED ORDER — MENTHOL 3 MG MT LOZG: 1.0000 | LOZENGE | OROMUCOSAL | Status: DC | PRN

## 2019-08-23 MED ORDER — PHENYLEPHRINE 40 MCG/ML (10ML) SYRINGE FOR IV PUSH (FOR BLOOD PRESSURE SUPPORT)
PREFILLED_SYRINGE | INTRAVENOUS | Status: AC
  Filled 2019-08-23: qty 20

## 2019-08-23 MED ORDER — GABAPENTIN 300 MG PO CAPS
300.0000 mg | ORAL_CAPSULE | Freq: Three times a day (TID) | ORAL | Status: DC
  Administered 2019-08-23 – 2019-08-24 (×4): 300 mg via ORAL
  Filled 2019-08-23 (×4): qty 1

## 2019-08-23 MED ORDER — DIPHENHYDRAMINE HCL 12.5 MG/5ML PO ELIX: 12.5000 mg | ORAL_SOLUTION | ORAL | Status: DC | PRN

## 2019-08-23 MED ORDER — ROPIVACAINE HCL 5 MG/ML IJ SOLN
INTRAMUSCULAR | Status: DC | PRN
  Administered 2019-08-23: 20 mL via PERINEURAL

## 2019-08-23 MED ORDER — ACETAMINOPHEN 10 MG/ML IV SOLN
INTRAVENOUS | Status: AC
  Filled 2019-08-23: qty 100

## 2019-08-23 MED ORDER — DEXAMETHASONE SODIUM PHOSPHATE 10 MG/ML IJ SOLN
8.0000 mg | Freq: Once | INTRAMUSCULAR | Status: AC
  Administered 2019-08-23: 8 mg via INTRAVENOUS

## 2019-08-23 MED ORDER — FENTANYL CITRATE (PF) 100 MCG/2ML IJ SOLN: 50.0000 ug | INTRAMUSCULAR | Status: DC

## 2019-08-23 MED ORDER — LIDOCAINE 2% (20 MG/ML) 5 ML SYRINGE
INTRAMUSCULAR | Status: AC
  Filled 2019-08-23: qty 5

## 2019-08-23 SURGICAL SUPPLY — 53 items
ATTUNE MED DOME PAT 38 KNEE (Knees) ×2 IMPLANT
ATTUNE PS FEM RT SZ 5 CEM KNEE (Femur) ×2 IMPLANT
ATTUNE PSRP INSR SZ5 8 KNEE (Insert) ×2 IMPLANT
BAG ZIPLOCK 12X15 (MISCELLANEOUS) ×2 IMPLANT
BASE TIBIAL ROT PLAT SZ 5 KNEE (Knees) ×1 IMPLANT
BLADE SAG 18X100X1.27 (BLADE) ×2 IMPLANT
BLADE SAW SGTL 11.0X1.19X90.0M (BLADE) ×2 IMPLANT
BLADE SURG SZ10 CARB STEEL (BLADE) ×4 IMPLANT
BNDG ELASTIC 3X5.8 VLCR STR LF (GAUZE/BANDAGES/DRESSINGS) ×2 IMPLANT
BNDG ELASTIC 6X5.8 VLCR STR LF (GAUZE/BANDAGES/DRESSINGS) ×2 IMPLANT
BOWL SMART MIX CTS (DISPOSABLE) ×2 IMPLANT
CEMENT HV SMART SET (Cement) ×4 IMPLANT
COVER SURGICAL LIGHT HANDLE (MISCELLANEOUS) ×2 IMPLANT
COVER WAND RF STERILE (DRAPES) IMPLANT
CUFF TOURN SGL QUICK 34 (TOURNIQUET CUFF) ×1
CUFF TRNQT CYL 34X4.125X (TOURNIQUET CUFF) ×1 IMPLANT
DECANTER SPIKE VIAL GLASS SM (MISCELLANEOUS) ×2 IMPLANT
DRAPE U-SHAPE 47X51 STRL (DRAPES) ×2 IMPLANT
DRSG AQUACEL AG ADV 3.5X10 (GAUZE/BANDAGES/DRESSINGS) ×2 IMPLANT
DURAPREP 26ML APPLICATOR (WOUND CARE) ×2 IMPLANT
ELECT REM PT RETURN 15FT ADLT (MISCELLANEOUS) ×2 IMPLANT
GLOVE BIO SURGEON STRL SZ7 (GLOVE) ×2 IMPLANT
GLOVE BIO SURGEON STRL SZ8 (GLOVE) ×2 IMPLANT
GLOVE BIOGEL PI IND STRL 7.0 (GLOVE) ×1 IMPLANT
GLOVE BIOGEL PI IND STRL 8 (GLOVE) ×1 IMPLANT
GLOVE BIOGEL PI INDICATOR 7.0 (GLOVE) ×1
GLOVE BIOGEL PI INDICATOR 8 (GLOVE) ×1
GOWN STRL REUS W/TWL LRG LVL3 (GOWN DISPOSABLE) ×4 IMPLANT
HANDPIECE INTERPULSE COAX TIP (DISPOSABLE) ×1
HOLDER FOLEY CATH W/STRAP (MISCELLANEOUS) IMPLANT
IMMOBILIZER KNEE 20 (SOFTGOODS) ×2
IMMOBILIZER KNEE 20 THIGH 36 (SOFTGOODS) ×1 IMPLANT
KIT TURNOVER KIT A (KITS) IMPLANT
MANIFOLD NEPTUNE II (INSTRUMENTS) ×2 IMPLANT
NS IRRIG 1000ML POUR BTL (IV SOLUTION) ×2 IMPLANT
PACK TOTAL KNEE CUSTOM (KITS) ×2 IMPLANT
PADDING CAST COTTON 6X4 STRL (CAST SUPPLIES) ×2 IMPLANT
PENCIL SMOKE EVACUATOR (MISCELLANEOUS) IMPLANT
PIN DRILL FIX HALF THREAD (BIT) ×2 IMPLANT
PIN STEINMAN FIXATION KNEE (PIN) ×2 IMPLANT
PROTECTOR NERVE ULNAR (MISCELLANEOUS) ×2 IMPLANT
SET HNDPC FAN SPRY TIP SCT (DISPOSABLE) ×1 IMPLANT
STRIP CLOSURE SKIN 1/2X4 (GAUZE/BANDAGES/DRESSINGS) ×4 IMPLANT
SUT MNCRL AB 4-0 PS2 18 (SUTURE) ×2 IMPLANT
SUT STRATAFIX 0 PDS 27 VIOLET (SUTURE) ×2
SUT VIC AB 2-0 CT1 27 (SUTURE) ×3
SUT VIC AB 2-0 CT1 TAPERPNT 27 (SUTURE) ×3 IMPLANT
SUTURE STRATFX 0 PDS 27 VIOLET (SUTURE) ×1 IMPLANT
TIBIAL BASE ROT PLAT SZ 5 KNEE (Knees) ×2 IMPLANT
TRAY FOLEY MTR SLVR 16FR STAT (SET/KITS/TRAYS/PACK) ×2 IMPLANT
WATER STERILE IRR 1000ML POUR (IV SOLUTION) ×4 IMPLANT
WRAP KNEE MAXI GEL POST OP (GAUZE/BANDAGES/DRESSINGS) ×2 IMPLANT
YANKAUER SUCT BULB TIP 10FT TU (MISCELLANEOUS) ×2 IMPLANT

## 2019-08-23 NOTE — Op Note (Signed)
OPERATIVE REPORT-TOTAL KNEE ARTHROPLASTY   Pre-operative diagnosis- Osteoarthritis  Right knee(s)  Post-operative diagnosis- Osteoarthritis Right knee(s)  Procedure-  Right  Total Knee Arthroplasty  Surgeon- Dione Plover. Monica Codd, MD  Assistant- Theresa Duty, PA-C   Anesthesia-  Adductor canal block and spinal  EBL-25 mL   Drains Hemovac  Tourniquet time-  Total Tourniquet Time Documented: Thigh (Right) - 40 minutes Total: Thigh (Right) - 40 minutes     Complications- None  Condition-PACU - hemodynamically stable.   Brief Clinical Note  Paul Moon is a 74 y.o. year old male with end stage OA of his right knee with progressively worsening pain and dysfunction. He has constant pain, with activity and at rest and significant functional deficits with difficulties even with ADLs. He has had extensive non-op management including analgesics, injections of cortisone and viscosupplements, and home exercise program, but remains in significant pain with significant dysfunction. Radiographs show bone on bone arthritis medial and patellofemoral. He presents now for right Total Knee Arthroplasty.    Procedure in detail---   The patient is brought into the operating room and positioned supine on the operating table. After successful administration of  Adductor canal block and spinal,   a tourniquet is placed high on the  Right thigh(s) and the lower extremity is prepped and draped in the usual sterile fashion. Time out is performed by the operating team and then the  Right lower extremity is wrapped in Esmarch, knee flexed and the tourniquet inflated to 300 mmHg.       A midline incision is made with a ten blade through the subcutaneous tissue to the level of the extensor mechanism. A fresh blade is used to make a medial parapatellar arthrotomy. Soft tissue over the proximal medial tibia is subperiosteally elevated to the joint line with a knife and into the semimembranosus bursa with a  Cobb elevator. Soft tissue over the proximal lateral tibia is elevated with attention being paid to avoiding the patellar tendon on the tibial tubercle. The patella is everted, knee flexed 90 degrees and the ACL and PCL are removed. Findings are bone on bone medial and patellofemoral with large global osteophytes.        The drill is used to create a starting hole in the distal femur and the canal is thoroughly irrigated with sterile saline to remove the fatty contents. The 5 degree Right  valgus alignment guide is placed into the femoral canal and the distal femoral cutting block is pinned to remove 9 mm off the distal femur. Resection is made with an oscillating saw.      The tibia is subluxed forward and the menisci are removed. The extramedullary alignment guide is placed referencing proximally at the medial aspect of the tibial tubercle and distally along the second metatarsal axis and tibial crest. The block is pinned to remove 56mm off the more deficient medial  side. Resection is made with an oscillating saw. Size 4is the most appropriate size for the tibia and the proximal tibia is prepared with the modular drill and keel punch for that size.      The femoral sizing guide is placed and size 4 is most appropriate. Rotation is marked off the epicondylar axis and confirmed by creating a rectangular flexion gap at 90 degrees. The size 4 cutting block is pinned in this rotation and the anterior, posterior and chamfer cuts are made with the oscillating saw. The intercondylar block is then placed and that cut is made.  Trial size 4 tibial component, trial size 4 posterior stabilized femur and a 8  mm posterior stabilized rotating platform insert trial is placed. Full extension is achieved with excellent varus/valgus and anterior/posterior balance throughout full range of motion. The patella is everted and thickness measured to be 24  mm. Free hand resection is taken to 14 mm, a 38 template is placed, lug  holes are drilled, trial patella is placed, and it tracks normally. Osteophytes are removed off the posterior femur with the trial in place. All trials are removed and the cut bone surfaces prepared with pulsatile lavage. Cement is mixed and once ready for implantation, the size 4 tibial implant, size  4 posterior stabilized femoral component, and the size 38 patella are cemented in place and the patella is held with the clamp. The trial insert is placed and the knee held in full extension. The Exparel (20 ml mixed with 60 ml saline) is injected into the extensor mechanism, posterior capsule, medial and lateral gutters and subcutaneous tissues.  All extruded cement is removed and once the cement is hard the permanent 8 mm posterior stabilized rotating platform insert is placed into the tibial tray.      The wound is copiously irrigated with saline solution and the extensor mechanism closed over a hemovac drain with #1 V-loc suture. The tourniquet is released for a total tourniquet time of 40  minutes. Flexion against gravity is 140 degrees and the patella tracks normally. Subcutaneous tissue is closed with 2.0 vicryl and subcuticular with running 4.0 Monocryl. The incision is cleaned and dried and steri-strips and a bulky sterile dressing are applied. The limb is placed into a knee immobilizer and the patient is awakened and transported to recovery in stable condition.      Please note that a surgical assistant was a medical necessity for this procedure in order to perform it in a safe and expeditious manner. Surgical assistant was necessary to retract the ligaments and vital neurovascular structures to prevent injury to them and also necessary for proper positioning of the limb to allow for anatomic placement of the prosthesis.   Dione Plover Newel Oien, MD    08/23/2019, 1:15 PM

## 2019-08-23 NOTE — Progress Notes (Signed)
Orthopedic Tech Progress Note Patient Details:  Paul Moon [birth year] [id number]  CPM Right Knee CPM Right Knee: Off Right Knee Flexion (Degrees): 40 Right Knee Extension (Degrees): 10 Additional Comments: Trapeze bar  Post Interventions Patient Tolerated: Well Instructions Provided: Care of device  [place] Pink 08/23/2019, 6:20 PM

## 2019-08-23 NOTE — Anesthesia Procedure Notes (Signed)
Spinal  Patient location during procedure: OR Start time: 08/23/2019 11:55 AM End time: 08/23/2019 12:04 PM Reason for block: at surgeon's request Staffing Performed: resident/CRNA  Anesthesiologist: Lynda Rainwater, MD Resident/CRNA: West Pugh, CRNA Preanesthetic Checklist Completed: patient identified, IV checked, site marked, risks and benefits discussed, surgical consent, monitors and equipment checked, pre-op evaluation and timeout performed Spinal Block Patient position: sitting Prep: DuraPrep and site prepped and draped Patient monitoring: heart rate, continuous pulse ox and blood pressure Approach: midline Location: L3-4 Injection technique: single-shot Needle Needle type: Pencan  Needle gauge: 24 G Needle length: 9 cm Assessment Sensory level: T4 Additional Notes Expiration of kit checked and confirmed. Patient tolerated procedure well,without complications with noted clear CSF. Loss of motor and sensory on exam post injection. Dr Sabra Heck present for procedure.

## 2019-08-23 NOTE — Anesthesia Preprocedure Evaluation (Signed)
Anesthesia Evaluation  Patient identified by MRN, date of birth, ID band Patient awake    Reviewed: Allergy & Precautions, H&P , NPO status , Patient's Chart, lab work & pertinent test results  Airway Mallampati: II  TM Distance: >3 FB Neck ROM: Full    Dental no notable dental hx.    Pulmonary sleep apnea and Continuous Positive Airway Pressure Ventilation , former smoker,    Pulmonary exam normal breath sounds clear to auscultation       Cardiovascular hypertension, negative cardio ROS Normal cardiovascular exam Rhythm:Regular Rate:Normal     Neuro/Psych  Headaches, PSYCHIATRIC DISORDERS Depression Bipolar Disorder    GI/Hepatic negative GI ROS, Neg liver ROS,   Endo/Other  negative endocrine ROS  Renal/GU negative Renal ROS  negative genitourinary   Musculoskeletal  (+) Arthritis , Osteoarthritis,    Abdominal (+) + obese,   Peds negative pediatric ROS (+)  Hematology negative hematology ROS (+)   Anesthesia Other Findings   Reproductive/Obstetrics negative OB ROS                             Anesthesia Physical  Anesthesia Plan  ASA: II  Anesthesia Plan: Spinal   Post-op Pain Management:  Regional for Post-op pain   Induction: Intravenous  PONV Risk Score and Plan: 1 and Ondansetron and Treatment may vary due to age or medical condition  Airway Management Planned: Simple Face Mask  Additional Equipment:   Intra-op Plan:   Post-operative Plan:   Informed Consent: I have reviewed the patients History and Physical, chart, labs and discussed the procedure including the risks, benefits and alternatives for the proposed anesthesia with the patient or authorized representative who has indicated his/her understanding and acceptance.     Dental advisory given  Plan Discussed with: CRNA  Anesthesia Plan Comments:         Anesthesia Quick Evaluation

## 2019-08-23 NOTE — Transfer of Care (Signed)
Immediate Anesthesia Transfer of Care Note  Patient: Paul Moon  Procedure(s) Performed: TOTAL KNEE ARTHROPLASTY (N/A Knee)  Patient Location: PACU  Anesthesia Type:Spinal and MAC combined with regional for post-op pain  Level of Consciousness: awake, alert  and patient cooperative  Airway & Oxygen Therapy: Patient Spontanous Breathing and Patient connected to face mask oxygen  Post-op Assessment: Report given to RN and Post -op Vital signs reviewed and stable  Post vital signs: Reviewed and stable  Last Vitals:  Vitals Value Taken Time  BP 125/65 08/23/19 1338  Temp    Pulse 77 08/23/19 1340  Resp 19 08/23/19 1340  SpO2 97 % 08/23/19 1340  Vitals shown include unvalidated device data.  Last Pain:  Vitals:   08/23/19 1140  TempSrc:   PainSc: 0-No pain      Patients Stated Pain Goal: 4 (25/00/37 0488)  Complications: No complications documented.

## 2019-08-23 NOTE — Anesthesia Postprocedure Evaluation (Signed)
Anesthesia Post Note  Patient: Paul Moon  Procedure(s) Performed: TOTAL KNEE ARTHROPLASTY (N/A Knee)     Patient location during evaluation: PACU Anesthesia Type: Spinal Level of consciousness: awake and alert Pain management: pain level controlled Vital Signs Assessment: post-procedure vital signs reviewed and stable Respiratory status: spontaneous breathing, nonlabored ventilation and respiratory function stable Cardiovascular status: blood pressure returned to baseline and stable Postop Assessment: no apparent nausea or vomiting Anesthetic complications: no   No complications documented.  Last Vitals:  Vitals:   08/23/19 1500 08/23/19 1538  BP:  (!) 146/72  Pulse: 77 75  Resp: 10 16  Temp:    SpO2: 100% 100%    Last Pain:  Vitals:   08/23/19 1542  TempSrc:   PainSc: 0-No pain                 Lynda Rainwater

## 2019-08-23 NOTE — Discharge Instructions (Addendum)
Paul Arabian, MD Total Joint Specialist [facility] [address], [address] [place], [place] [address] [phone]  TOTAL KNEE REPLACEMENT POSTOPERATIVE DIRECTIONS    Knee Rehabilitation, Guidelines Following Surgery  Results after knee surgery are often greatly improved when you follow the exercise, range of motion and muscle strengthening exercises prescribed by your doctor. Safety measures are also important to protect the knee from further injury. If any of these exercises cause you to have increased pain or swelling in your knee joint, decrease the amount until you are comfortable again and slowly increase them. If you have problems or questions, call your caregiver or physical therapist for advice.   BLOOD CLOT PREVENTION . Take a 10 mg Xarelto once a day for three weeks following surgery. Then resume one 81 mg aspirin once a day. . You may resume your vitamins/supplements once you have discontinued the Xarelto. . Do not take any NSAIDs (Advil, Aleve, Ibuprofen, Meloxicam, etc.) until you have discontinued the Xarelto.    Information on my medicine - XARELTO (Rivaroxaban)  Why was Xarelto prescribed for you? Xarelto was prescribed for you to reduce the risk of blood clots forming after orthopedic surgery. The medical term for these abnormal blood clots is venous thromboembolism (VTE).  What do you need to know about xarelto ? Take your Xarelto ONCE DAILY at the same time every day. You may take it either with or without food.  If you have difficulty swallowing the tablet whole, you may crush it and mix in applesauce just prior to taking your dose.  Take Xarelto exactly as prescribed by your doctor and DO NOT stop taking Xarelto without talking to the doctor who prescribed the medication.  Stopping without other VTE prevention medication to take the place of Xarelto may increase your risk of developing a clot.  After discharge, you should have  regular check-up appointments with your healthcare provider that is prescribing your Xarelto.    What do you do if you miss a dose? If you miss a dose, take it as soon as you remember on the same day then continue your regularly scheduled once daily regimen the next day. Do not take two doses of Xarelto on the same day.   Important Safety Information A possible side effect of Xarelto is bleeding. You should call your healthcare provider right away if you experience any of the following: ? Bleeding from an injury or your nose that does not stop. ? Unusual colored urine (red or dark brown) or unusual colored stools (red or black). ? Unusual bruising for unknown reasons. ? A serious fall or if you hit your head (even if there is no bleeding).  Some medicines may interact with Xarelto and might increase your risk of bleeding while on Xarelto. To help avoid this, consult your healthcare provider or pharmacist prior to using any new prescription or non-prescription medications, including herbals, vitamins, non-steroidal anti-inflammatory drugs (NSAIDs) and supplements.  This website has more information on Xarelto: [email or web address]-benson.com/.    HOME CARE INSTRUCTIONS  . Remove items at home which could result in a fall. This includes throw rugs or furniture in walking pathways.  . ICE to the affected knee as much as tolerated. Icing helps control swelling. If the swelling is well controlled you will be more comfortable and rehab easier. Continue to use ice on the knee for pain and swelling from surgery. You may notice swelling that will progress down to the foot and ankle. This is normal after  surgery. Elevate the leg when you are not up walking on it.    . Continue to use the breathing machine which will help keep your temperature down. It is common for your temperature to cycle up and down following surgery, especially at night when you are not up moving around and exerting yourself. The breathing  machine keeps your lungs expanded and your temperature down. . Do not place pillow under the operative knee, focus on keeping the knee straight while resting  DIET You may resume your previous home diet once you are discharged from the hospital.  DRESSING / [place] / SHOWERING . Keep your bulky bandage on for 2 days. On the third post-operative day you may remove the Ace bandage and gauze. There is a waterproof adhesive bandage on your skin which will stay in place until your first follow-up appointment. Once you remove this you will not need to place another bandage . You may begin showering 3 days following surgery, but do not submerge the incision under water.  ACTIVITY For the first 5 days, the key is rest and control of pain and swelling . Do your home exercises twice a day starting on post-operative day 3. On the days you go to physical therapy, just do the home exercises once that day. . You should rest, ice and elevate the leg for 50 minutes out of every hour. Get up and walk/stretch for 10 minutes per hour. After 5 days you can increase your activity slowly as tolerated. . Walk with your walker as instructed. Use the walker until you are comfortable transitioning to a cane. Walk with the cane in the opposite hand of the operative leg. You may discontinue the cane once you are comfortable and walking steadily. . Avoid periods of inactivity such as sitting longer than an hour when not asleep. This helps prevent blood clots.  . You may discontinue the knee immobilizer once you are able to perform a straight leg raise while lying down. . You may resume a sexual relationship in one month or when given the OK by your doctor.  . You may return to work once you are cleared by your doctor.  . Do not drive a car for 6 weeks or until released by your surgeon.  . Do not drive while taking narcotics.  TED HOSE STOCKINGS Wear the elastic stockings on both legs for three weeks following surgery  during the day. You may remove them at night for sleeping.  WEIGHT BEARING Weight bearing as tolerated with assist device (walker, cane, etc) as directed, use it as long as suggested by your surgeon or therapist, typically at least 4-6 weeks.  POSTOPERATIVE CONSTIPATION PROTOCOL Constipation - defined medically as fewer than three stools per week and severe constipation as less than one stool per week.  One of the most common issues patients have following surgery is constipation.  Even if you have a regular bowel pattern at home, your normal regimen is likely to be disrupted due to multiple reasons following surgery.  Combination of anesthesia, postoperative narcotics, change in appetite and fluid intake all can affect your bowels.  In order to avoid complications following surgery, here are some recommendations in order to help you during your recovery period.  . Colace (docusate) - Pick up an over-the-counter form of Colace or another stool softener and take twice a day as long as you are requiring postoperative pain medications.  Take with a full glass of water daily.  If you  experience loose stools or diarrhea, hold the colace until you stool forms back up. If your symptoms do not get better within 1 week or if they get worse, check with your doctor. . Dulcolax (bisacodyl) - Pick up over-the-counter and take as directed by the product packaging as needed to assist with the movement of your bowels.  Take with a full glass of water.  Use this product as needed if not relieved by Colace only.  . MiraLax (polyethylene glycol) - Pick up over-the-counter to have on hand. MiraLax is a solution that will increase the amount of water in your bowels to assist with bowel movements.  Take as directed and can mix with a glass of water, juice, soda, coffee, or tea. Take if you go more than two days without a movement. Do not use MiraLax more than once per day. Call your doctor if you are still constipated or  irregular after using this medication for 7 days in a row.  If you continue to have problems with postoperative constipation, please contact the office for further assistance and recommendations.  If you experience "the worst abdominal pain ever" or develop nausea or vomiting, please contact the office immediatly for further recommendations for treatment.  ITCHING If you experience itching with your medications, try taking only a single pain pill, or even half a pain pill at a time.  You can also use Benadryl over the counter for itching or also to help with sleep.   MEDICATIONS See your medication summary on the "After Visit Summary" that the nursing staff will review with you prior to discharge.  You may have some home medications which will be placed on hold until you complete the course of blood thinner medication.  It is important for you to complete the blood thinner medication as prescribed by your surgeon.  Continue your approved medications as instructed at time of discharge.  PRECAUTIONS . If you experience chest pain or shortness of breath - call 911 immediately for transfer to the hospital emergency department.  . If you develop a fever greater that 101 F, purulent drainage from wound, increased redness or drainage from wound, foul odor from the wound/dressing, or calf pain - CONTACT YOUR SURGEON.                                                   FOLLOW-UP APPOINTMENTS Make sure you keep all of your appointments after your operation with your surgeon and caregivers. You should call the office at the above phone number and make an appointment for approximately two weeks after the date of your surgery or on the date instructed by your surgeon outlined in the "After Visit Summary".  RANGE OF MOTION AND STRENGTHENING EXERCISES  Rehabilitation of the knee is important following a knee injury or an operation. After just a few days of immobilization, the muscles of the thigh which control the  knee become weakened and shrink (atrophy). Knee exercises are designed to build up the tone and strength of the thigh muscles and to improve knee motion. Often times heat used for twenty to thirty minutes before working out will loosen up your tissues and help with improving the range of motion but do not use heat for the first two weeks following surgery. These exercises can be done on a training (exercise) mat, on the  floor, on a table or on a bed. Use what ever works the best and is most comfortable for you Knee exercises include:  . Leg Lifts - While your knee is still immobilized in a splint or cast, you can do straight leg raises. Lift the leg to 60 degrees, hold for 3 sec, and slowly lower the leg. Repeat 10-20 times 2-3 times daily. Perform this exercise against resistance later as your knee gets better.  Javier Docker and Hamstring Sets - Tighten up the muscle on the front of the thigh (Quad) and hold for 5-10 sec. Repeat this 10-20 times hourly. Hamstring sets are done by pushing the foot backward against an object and holding for 5-10 sec. Repeat as with quad sets.   Leg Slides: Lying on your back, slowly slide your foot toward your buttocks, bending your knee up off the floor (only go as far as is comfortable). Then slowly slide your foot back down until your leg is flat on the floor again.  Angel Wings: Lying on your back spread your legs to the side as far apart as you can without causing discomfort.  A rehabilitation program following serious knee injuries can speed recovery and prevent re-injury in the future due to weakened muscles. Contact your doctor or a physical therapist for more information on knee rehabilitation.   IF YOU ARE TRANSFERRED TO A SKILLED REHAB FACILITY If the patient is transferred to a skilled rehab facility following release from the hospital, a list of the current medications will be sent to the facility for the patient to continue.  When discharged from the skilled rehab  facility, please have the facility set up the patient's [place] prior to being released. Also, the skilled facility will be responsible for providing the patient with their medications at time of release from the facility to include their pain medication, the muscle relaxants, and their blood thinner medication. If the patient is still at the rehab facility at time of the two week follow up appointment, the skilled rehab facility will also need to assist the patient in arranging follow up appointment in our office and any transportation needs.  MAKE SURE YOU:  . Understand these instructions.  . Get help right away if you are not doing well or get worse.   DENTAL ANTIBIOTICS:  In most cases prophylactic antibiotics for Dental procdeures after total joint surgery are not necessary.  Exceptions are as follows:  1. History of prior total joint infection  2. Severely immunocompromised (Organ Transplant, cancer chemotherapy, Rheumatoid biologic meds such as [place])  3. Poorly controlled diabetes (A1C &gt; 8.0, blood glucose over 200)  If you have one of these conditions, contact your surgeon for an antibiotic prescription, prior to your dental procedure.    Pick up stool softner and laxative for home use following surgery while on pain medications. Do not submerge incision under water. Please use good hand washing techniques while changing dressing each day. May shower starting three days after surgery. Please use a clean towel to pat the incision dry following showers. Continue to use ice for pain and swelling after surgery. Do not use any lotions or creams on the incision until instructed by your surgeon.

## 2019-08-23 NOTE — Anesthesia Procedure Notes (Signed)
Anesthesia Regional Block: Adductor canal block   Pre-Anesthetic Checklist: ,, timeout performed, Correct Patient, Correct Site, Correct Laterality, Correct Procedure, Correct Position, site marked, Risks and benefits discussed,  Surgical consent,  Pre-op evaluation,  At surgeon's request and post-op pain management  Laterality: Right  Prep: chloraprep       Needles:  Injection technique: Single-shot  Needle Type: Stimiplex     Needle Length: 9cm  Needle Gauge: 21     Additional Needles:   Procedures:,,,, ultrasound used (permanent image in chart),,,,  Narrative:  Start time: 08/23/2019 10:47 AM End time: 08/23/2019 10:52 AM Injection made incrementally with aspirations every 5 mL.  Performed by: Personally  Anesthesiologist: Lynda Rainwater, MD

## 2019-08-23 NOTE — Evaluation (Signed)
Physical Therapy Evaluation Patient Details Name: Paul Moon MRN: [id number] DOB: Dec 09, 1945 Today's Date: 08/23/2019   History of Present Illness  s/p R TKA; PMH: L knee arthroscopy, bipolar disorder, cognitive impairment  Clinical Impression  Pt is s/p TKA resulting in the deficits listed below (see PT Problem List).  Pt amb ~ 28' with RW and min to min/guard assist. Anticipate steady progress in acute setting. Will continue to follow   Pt will benefit from skilled PT to increase their independence and safety with mobility to allow discharge to the venue listed below.  \    Follow Up Recommendations Follow surgeon's recommendation for DC plan and follow-up therapies    Equipment Recommendations  Rolling walker with 5" wheels;3in1 (PT)    Recommendations for Other Services       Precautions / Restrictions Precautions Precautions: Fall;Knee Required Braces or Orthoses: Knee Immobilizer - Right Knee Immobilizer - Right: Discontinue once straight leg raise with < 10 degree lag Restrictions Weight Bearing Restrictions: No      Mobility  Bed Mobility Overal bed mobility: Needs Assistance Bed Mobility: Supine to Sit     Supine to sit: Min guard     General bed mobility comments: for lines and safety  Transfers Overall transfer level: Needs assistance Equipment used: Rolling walker (2 wheeled) Transfers: Sit to/from Stand Sit to Stand: Min assist         General transfer comment: cues for hand placement  Ambulation/Gait Ambulation/Gait assistance: Min assist;Min guard Gait Distance (Feet): 70 Feet Assistive device: Rolling walker (2 wheeled) Gait Pattern/deviations: Step-to pattern;Decreased stance time - right     General Gait Details: cues for sequence and RW position  Stairs            Wheelchair Mobility    Modified Rankin (Stroke Patients Only)       Balance                                             Pertinent  Vitals/Pain Pain Assessment: 0-10 Pain Score: 3  Pain Location: right knee Pain Descriptors / Indicators: Sore Pain Intervention(s): Repositioned;Premedicated before session;Limited activity within patient's tolerance    Home Living Family/patient expects to be discharged to:: Private residence Living Arrangements: Parent;Other relatives Available Help at Discharge: Family Type of Home: House Home Access: Stairs to enter   [employer] of Steps: 4 and 1 to sunroom Home Layout: Able to live on main level with bedroom/bathroom Home Equipment: Kasandra Knudsen - single point      Prior Function Level of Independence: Independent;Independent with assistive device(s)         Comments: amb with cane     Hand Dominance        Extremity/Trunk Assessment   Upper Extremity Assessment Upper Extremity Assessment: Overall WFL for tasks assessed    Lower Extremity Assessment Lower Extremity Assessment: RLE deficits/detail RLE Deficits / Details: ankle WFL, knee extension and hip flexion 2+/5, knee flexion ~ 5 to 65 degrees       Communication   Communication: No difficulties  Cognition Arousal/Alertness: Awake/alert Behavior During Therapy: WFL for tasks assessed/performed Overall Cognitive Status: Within Functional Limits for tasks assessed (hx of cognitive impairment)  General Comments      Exercises Total Joint Exercises Ankle Circles/Pumps: AROM;10 reps;Both Quad Sets: AROM;Both;10 reps   Assessment/Plan    PT Assessment Patient needs continued PT services  PT Problem List Decreased strength;Decreased range of motion;Decreased activity tolerance;Decreased knowledge of use of DME;Pain;Decreased mobility       PT Treatment Interventions DME instruction;Therapeutic exercise;Gait training;Therapeutic activities;Stair training;Functional mobility training;Patient/family education    PT Goals (Current goals can be  found in the Care Plan section)  Acute Rehab PT Goals Patient Stated Goal: have less knee pain PT Goal Formulation: With patient Time For Goal Achievement: 08/30/19 Potential to Achieve Goals: Good    Frequency 7X/week   Barriers to discharge        Co-evaluation               AM-PAC PT "6 Clicks" Mobility  Outcome Measure Help needed turning from your back to your side while in a flat bed without using bedrails?: A Little Help needed moving from lying on your back to sitting on the side of a flat bed without using bedrails?: A Little Help needed moving to and from a bed to a chair (including a wheelchair)?: A Little Help needed standing up from a chair using your arms (e.g., wheelchair or bedside chair)?: A Little Help needed to walk in hospital room?: A Little Help needed climbing 3-5 steps with a railing? : A Lot 6 Click Score: 17    End of Session Equipment Utilized During Treatment: Gait belt Activity Tolerance: Patient tolerated treatment well Patient left: in chair;with call bell/phone within reach;with chair alarm set   PT Visit Diagnosis: Difficulty in walking, not elsewhere classified (R26.2)    Time: 4827-0786 PT Time Calculation (min) (ACUTE ONLY): 23 min   Charges:   PT Evaluation $PT Eval Low Complexity: 1 Low PT Treatments $Gait Training: 8-22 mins        Baxter Flattery, PT  Acute Rehab Dept ([place]) [phone] Pager [phone]  08/23/2019   [facility] 08/23/2019, 6:07 PM

## 2019-08-23 NOTE — Progress Notes (Signed)
AssistedDr. Miller with right, ultrasound guided, adductor canal block. Side rails up, monitors on throughout procedure. See vital signs in flow sheet. Tolerated Procedure well.  

## 2019-08-23 NOTE — Progress Notes (Signed)
Orthopedic Tech Progress Note Patient Details:  Paul Moon January 01, 1946 [id number]  CPM Right Knee CPM Right Knee: On Right Knee Flexion (Degrees): 40 Right Knee Extension (Degrees): 10 Additional Comments: Trapeze bar  Post Interventions Patient Tolerated: Well Instructions Provided: Care of device  [place] Pink 08/23/2019, 2:57 PM

## 2019-08-23 NOTE — Anesthesia Procedure Notes (Signed)
Procedure Name: MAC Date/Time: 08/23/2019 11:49 AM Performed by: West Pugh, CRNA Pre-anesthesia Checklist: Patient identified, Emergency Drugs available, Suction available, Patient being monitored and Timeout performed Patient Re-evaluated:Patient Re-evaluated prior to induction Oxygen Delivery Method: Simple face mask Induction Type: IV induction Placement Confirmation: positive ETCO2 Dental Injury: Teeth and Oropharynx as per pre-operative assessment

## 2019-08-23 NOTE — Interval H&P Note (Signed)
History and Physical Interval Note:  08/23/2019 9:52 AM  Paul Moon  has presented today for surgery, with the diagnosis of right knee osteoarthritis.  The various methods of treatment have been discussed with the patient and family. After consideration of risks, benefits and other options for treatment, the patient has consented to  Procedure(s): TOTAL KNEE ARTHROPLASTY (N/A) as a surgical intervention.  The patient's history has been reviewed, patient examined, no change in status, stable for surgery.  I have reviewed the patient's chart and labs.  Questions were answered to the patient's satisfaction.     Pilar Plate Tida Saner

## 2019-08-24 ENCOUNTER — Encounter (HOSPITAL_COMMUNITY): Payer: Self-pay | Admitting: Orthopedic Surgery

## 2019-08-24 DIAGNOSIS — M1711 Unilateral primary osteoarthritis, right knee: Secondary | ICD-10-CM | POA: Diagnosis not present

## 2019-08-24 LAB — CBC
HCT: 35.6 % — ABNORMAL LOW (ref 39.0–52.0)
Hemoglobin: 11.9 g/dL — ABNORMAL LOW (ref 13.0–17.0)
MCH: 31.1 pg (ref 26.0–34.0)
MCHC: 33.4 g/dL (ref 30.0–36.0)
MCV: 93 fL (ref 80.0–100.0)
Platelets: 248 10*3/uL (ref 150–400)
RBC: 3.83 MIL/uL — ABNORMAL LOW (ref 4.22–5.81)
RDW: 14.7 % (ref 11.5–15.5)
WBC: 8.1 10*3/uL (ref 4.0–10.5)
nRBC: 0 % (ref 0.0–0.2)

## 2019-08-24 LAB — BASIC METABOLIC PANEL
Anion gap: 9 (ref 5–15)
BUN: 13 mg/dL (ref 8–23)
CO2: 24 mmol/L (ref 22–32)
Calcium: 8.6 mg/dL — ABNORMAL LOW (ref 8.9–10.3)
Chloride: 101 mmol/L (ref 98–111)
Creatinine, Ser: 1.22 mg/dL (ref 0.61–1.24)
GFR calc Af Amer: >60 mL/min (ref >60)
GFR calc non Af Amer: 58 mL/min — ABNORMAL LOW (ref >60)
Glucose, Bld: 150 mg/dL — ABNORMAL HIGH (ref 70–99)
Potassium: 4.4 mmol/L (ref 3.5–5.1)
Sodium: 134 mmol/L — ABNORMAL LOW (ref 135–145)

## 2019-08-24 MED ORDER — CLOTRIMAZOLE 1 % EX CREA
TOPICAL_CREAM | Freq: Two times a day (BID) | CUTANEOUS | Status: DC
  Filled 2019-08-24: qty 15

## 2019-08-24 MED ORDER — METHOCARBAMOL 500 MG PO TABS: 500.0000 mg | ORAL_TABLET | Freq: Four times a day (QID) | ORAL | 0 refills | Status: DC | PRN

## 2019-08-24 MED ORDER — OXYCODONE HCL 5 MG PO TABS: 5.0000 mg | ORAL_TABLET | Freq: Four times a day (QID) | ORAL | 0 refills | Status: DC | PRN

## 2019-08-24 MED ORDER — TRAMADOL HCL 50 MG PO TABS: 50.0000 mg | ORAL_TABLET | Freq: Four times a day (QID) | ORAL | 0 refills | Status: DC | PRN

## 2019-08-24 MED ORDER — GABAPENTIN 300 MG PO CAPS: ORAL_CAPSULE | ORAL | 0 refills | Status: DC

## 2019-08-24 MED ORDER — RIVAROXABAN 10 MG PO TABS: 10.0000 mg | ORAL_TABLET | Freq: Every day | ORAL | 0 refills | Status: DC

## 2019-08-24 NOTE — Progress Notes (Signed)
Patient discharged to home w/ family. Given all belongings, instructions, equipment. Verbalized understanding of all instructions. Escorted to pov via w/c. 

## 2019-08-24 NOTE — Progress Notes (Signed)
Physical Therapy Treatment Patient Details Name: Paul Moon MRN: [id number] DOB: 10/25/1945 Today's Date: 08/24/2019    History of Present Illness s/p R TKA; PMH: L knee arthroscopy, bipolar disorder, cognitive impairment    PT Comments    Progressing well with mobility. Moderate pain with activity. Will plan to have a 2nd session prior to d/c home later today.   Follow Up Recommendations  Follow surgeon's recommendation for DC plan and follow-up therapies     Equipment Recommendations  Rolling walker with 5" wheels;3in1 (PT)    Recommendations for Other Services       Precautions / Restrictions Precautions Precautions: Fall;Knee Restrictions Weight Bearing Restrictions: No Other Position/Activity Restrictions: WBAT    Mobility  Bed Mobility               General bed mobility comments: oob in recliner  Transfers Overall transfer level: Needs assistance Equipment used: Rolling walker (2 wheeled) Transfers: Sit to/from Stand Sit to Stand: Min guard         General transfer comment: VC safety, hand plaement. Min guard for safety.  Ambulation/Gait Ambulation/Gait assistance: Min guard Gait Distance (Feet): 100 Feet Assistive device: Rolling walker (2 wheeled) Gait Pattern/deviations: Step-to pattern;Decreased stance time - right;Antalgic     General Gait Details: Mildly antalgic gait. Slow gait speed. Cues for safety, sequence.   Stairs             Wheelchair Mobility    Modified Rankin (Stroke Patients Only)       Balance Overall balance assessment: Needs assistance         Standing balance support: Bilateral upper extremity supported Standing balance-Leahy Scale: Poor                              Cognition Arousal/Alertness: Awake/alert Behavior During Therapy: WFL for tasks assessed/performed Overall Cognitive Status: Within Functional Limits for tasks assessed                                         Exercises Total Joint Exercises Ankle Circles/Pumps: AROM;Both;10 reps Quad Sets: AROM;Both;10 reps Heel Slides: AAROM;Right;10 reps Hip ABduction/ADduction: AROM;Right;10 reps Straight Leg Raises: AROM;Right;10 reps Goniometric ROM: ~5-65 degrees    General Comments        Pertinent Vitals/Pain Pain Assessment: 0-10 Pain Score: 5  Pain Location: R knee Pain Descriptors / Indicators: Sore;Aching Pain Intervention(s): Monitored during session;Ice applied    Home Living                      Prior Function            PT Goals (current goals can now be found in the care plan section) Progress towards PT goals: Progressing toward goals    Frequency    7X/week      PT Plan Current plan remains appropriate    Co-evaluation              AM-PAC PT "6 Clicks" Mobility   Outcome Measure  Help needed turning from your back to your side while in a flat bed without using bedrails?: A Little Help needed moving from lying on your back to sitting on the side of a flat bed without using bedrails?: A Little Help needed moving to and from a bed to a chair (including a wheelchair)?: A  Little Help needed standing up from a chair using your arms (e.g., wheelchair or bedside chair)?: A Little Help needed to walk in hospital room?: A Little Help needed climbing 3-5 steps with a railing? : A Little 6 Click Score: 18    End of Session Equipment Utilized During Treatment: Gait belt Activity Tolerance: Patient tolerated treatment well Patient left: in chair;with call bell/phone within reach;with chair alarm set   PT Visit Diagnosis: Difficulty in walking, not elsewhere classified (R26.2)     Time: 6893-4068 PT Time Calculation (min) (ACUTE ONLY): 19 min  Charges:  $Gait Training: 8-22 mins                        Doreatha Massed, PT Acute Rehabilitation  Office: [phone] Pager: [phone]

## 2019-08-24 NOTE — Progress Notes (Signed)
Subjective: 1 Day Post-Op Procedure(s) (LRB): TOTAL KNEE ARTHROPLASTY (N/A) Patient reports pain as mild.   Patient seen in rounds by Dr. Wynelle Link. Patient is well, and has had no acute complaints or problems other than pain in the right knee. Denies chest pain, SOB, or calf pain. Reports burning and itching in the right foot, especially between the toes. History of tinea pedis. No issues overnight.  We will continue therapy today, ambulated 64' yesterday.   Objective: Vital signs in last 24 hours: Temp:  [97.5 F (36.4 C)-99 F (37.2 C)] 98.4 F (36.9 C) (06/22 0531) Pulse Rate:  [73-88] 73 (06/22 0531) Resp:  [10-28] 18 (06/22 0531) BP: (99-170)/(57-92) 141/76 (06/22 0531) SpO2:  [93 %-100 %] 96 % (06/22 0531) Weight:  [99.9 kg] 99.9 kg (06/21 0945)  Intake/Output from previous day:  Intake/Output Summary (Last 24 hours) at 08/24/2019 0729 Last data filed at 08/24/2019 0600 Gross per 24 hour  Intake 3709.93 ml  Output 3100 ml  Net 609.93 ml     Intake/Output this shift: No intake/output data recorded.  Labs: Recent Labs    08/24/19 0301  HGB 11.9*   Recent Labs    08/24/19 0301  WBC 8.1  RBC 3.83*  HCT 35.6*  PLT 248   Recent Labs    08/24/19 0301  NA 134*  K 4.4  CL 101  CO2 24  BUN 13  CREATININE 1.22  GLUCOSE 150*  CALCIUM 8.6*   No results for input(s): LABPT, INR in the last 72 hours.  Exam: General - Patient is Alert and Oriented Extremity - Neurologically intact Neurovascular intact Sensation intact distally Dorsiflexion/Plantar flexion intact Dressing - dressing C/D/I Motor Function - intact, moving foot and toes well on exam.   Past Medical History:  Diagnosis Date  . Arthritis    INDEX FINGER JOINT LEFT HAND, right knee  . Bipolar disorder ([place])   . Depression   . History of gout 2018   right hand 2 fingers and right wrist-- 05-20-2017 per pt resolved  . Hyperplasia of prostate with lower urinary tract symptoms (LUTS)   .  Hypertension   . OSA on CPAP    per study 03-22-2013  moderate OSA w/ AHI 26.8/hr  . Pinched cervical nerve root    PT STATES C 7-8 PINCHED NERVE CAUSING NUMBNESS MIDDLE, RING AND LITTLE FINGER LEFT HAND - NO PAIN - STATES HE IS TRYING TO AVOID SURGERY.  . Pre-diabetes    followed by pcp  . Prostate cancer [facility]) urologist-  dr Alyson Ingles  oncologist-  dr Tammi Klippel   dx 05-24-2016  Stage T1c, Gleason 4+3, PSA 5.49, vol 65.5cc/  pt seek second opinion's at [facility] and [facility] not candidate for focal laser ablation/  scheduled for radiactive seed implants 05-26-2017  . Wears glasses     Assessment/Plan: 1 Day Post-Op Procedure(s) (LRB): TOTAL KNEE ARTHROPLASTY (N/A) Principal Problem:   OA (osteoarthritis) of knee Active Problems:   Primary osteoarthritis of right knee  Estimated body mass index is 33.49 kg/m as calculated from the following:   Height as of this encounter: 5\' 8"  (1.727 m).   Weight as of this encounter: 99.9 kg. Advance diet Up with therapy D/C IV fluids   Patient's anticipated LOS is less than 2 midnights, meeting these requirements: - Younger than 59 - Lives within 1 hour of care - Has a competent adult at home to recover with post-op recover - NO history of  - Chronic pain requiring opiods  -  Diabetes  - Coronary Artery Disease  - Heart failure  - Heart attack  - Stroke  - DVT/VTE  - Cardiac arrhythmia  - Respiratory Failure/COPD  - Renal failure  - Anemia  - Advanced Liver disease  DVT Prophylaxis - Xarelto Weight bearing as tolerated. Continue therapy.  Topical clotrimazole ordered for right foot.  Plan is to go Home after hospital stay. Plan for discharge later today if meeting goals with physical therapy. Scheduled for OPPT at [place] ([place]) Follow-up in the office in 2 weeks.   Theresa Duty, PA-C Orthopedic Surgery [phone] 08/24/2019, 7:29 AM

## 2019-08-24 NOTE — TOC Transition Note (Signed)
Transition of Care [facility]) - CM/SW Discharge Note   Patient Details  Name: Paul Moon MRN: [id number] Date of Birth: 1945-03-10  Transition of Care [facility] CM/SW Contact:  Lia Hopping, LCSW Phone Number: 08/24/2019, 11:05 AM   Clinical Narrative:    Therapy Plan: OPPT PT and Hand at [facility] and 3 in 1 delivered to the patient bedside by [employer].    Final next level of care: OP Rehab Barriers to Discharge: No Barriers Identified   Patient Goals and CMS Choice        Discharge Placement                       Discharge Plan and Services                DME Arranged: 3-N-1, Walker rolling DME Agency: [employer] Date DME Agency Contacted: 08/24/19 Time DME Agency Contacted: 0900 Representative spoke with at DME Agency: [place] ([place]) Interventions     Readmission Risk Interventions No flowsheet data found.

## 2019-08-24 NOTE — Progress Notes (Signed)
Physical Therapy Treatment Patient Details Name: Paul Moon MRN: [id number] DOB: 08/22/1945 Today's Date: 08/24/2019    History of Present Illness s/p R TKA; PMH: L knee arthroscopy, bipolar disorder, cognitive impairment    PT Comments    Pt continues to progress well. Reviewed/practiced gait and stair training. Issued HEP for pt to perform 2x/day until OP PT begins. All education completed. Okay to d/c from PT standpoint   Follow Up Recommendations  Follow surgeon's recommendation for DC plan and follow-up therapies     Equipment Recommendations  Rolling walker with 5" wheels    Recommendations for Other Services       Precautions / Restrictions Precautions Precautions: Fall;Knee Restrictions Weight Bearing Restrictions: No Other Position/Activity Restrictions: WBAT    Mobility  Bed Mobility               General bed mobility comments: oob in recliner  Transfers Overall transfer level: Needs assistance Equipment used: Rolling walker (2 wheeled) Transfers: Sit to/from Stand Sit to Stand: Min guard         General transfer comment: VC safety, hand plaement. Min guard for safety.  Ambulation/Gait Ambulation/Gait assistance: Min guard Gait Distance (Feet): 75 Feet Assistive device: Rolling walker (2 wheeled) Gait Pattern/deviations: Step-to pattern;Decreased stance time - right;Antalgic     General Gait Details: Mildly antalgic gait. Slow gait speed. Cues for safety, sequence.   Stairs Stairs: Yes Stairs assistance: Min guard Stair Management: Step to pattern;Forwards;One rail Left;With cane Number of Stairs: 2 General stair comments: up and overt portable stairs. vcs safety, technique, sequence. close guard for safety   Wheelchair Mobility    Modified Rankin (Stroke Patients Only)       Balance Overall balance assessment: Needs assistance         Standing balance support: Bilateral upper extremity supported Standing balance-Leahy  Scale: Poor                              Cognition Arousal/Alertness: Awake/alert Behavior During Therapy: WFL for tasks assessed/performed Overall Cognitive Status: Within Functional Limits for tasks assessed                                        Exercises    General Comments        Pertinent Vitals/Pain Pain Assessment: 0-10 Pain Score: 5  Pain Location: R knee Pain Descriptors / Indicators: Sore;Aching Pain Intervention(s): Monitored during session;Repositioned    Home Living                      Prior Function            PT Goals (current goals can now be found in the care plan section) Progress towards PT goals: Progressing toward goals    Frequency    7X/week      PT Plan Current plan remains appropriate    Co-evaluation              AM-PAC PT "6 Clicks" Mobility   Outcome Measure  Help needed turning from your back to your side while in a flat bed without using bedrails?: A Little Help needed moving from lying on your back to sitting on the side of a flat bed without using bedrails?: A Little Help needed moving to and from a bed to a chair (including  a wheelchair)?: A Little Help needed standing up from a chair using your arms (e.g., wheelchair or bedside chair)?: A Little Help needed to walk in hospital room?: A Little Help needed climbing 3-5 steps with a railing? : A Little 6 Click Score: 18    End of Session Equipment Utilized During Treatment: Gait belt Activity Tolerance: Patient tolerated treatment well Patient left: in chair;with call bell/phone within reach;with chair alarm set   PT Visit Diagnosis: Difficulty in walking, not elsewhere classified (R26.2)     Time: 6237-6283 PT Time Calculation (min) (ACUTE ONLY): 9 min  Charges:  $Gait Training: 8-22 mins                         Doreatha Massed, PT Acute Rehabilitation  Office: [phone] Pager: [phone]

## 2019-11-04 ENCOUNTER — Encounter: Payer: Self-pay | Admitting: Urology

## 2019-11-04 ENCOUNTER — Ambulatory Visit (INDEPENDENT_AMBULATORY_CARE_PROVIDER_SITE_OTHER): Payer: Medicare Other | Admitting: Urology

## 2019-11-04 ENCOUNTER — Other Ambulatory Visit: Payer: Self-pay

## 2019-11-04 VITALS — BP 152/71 | HR 71 | Temp 99.2°F | Ht 67.5 in | Wt 220.0 lb

## 2019-11-04 DIAGNOSIS — R350 Frequency of micturition: Secondary | ICD-10-CM | POA: Diagnosis not present

## 2019-11-04 DIAGNOSIS — C61 Malignant neoplasm of prostate: Secondary | ICD-10-CM | POA: Diagnosis not present

## 2019-11-04 DIAGNOSIS — N5201 Erectile dysfunction due to arterial insufficiency: Secondary | ICD-10-CM | POA: Diagnosis not present

## 2019-11-04 DIAGNOSIS — N3281 Overactive bladder: Secondary | ICD-10-CM | POA: Diagnosis not present

## 2019-11-04 LAB — URINALYSIS, ROUTINE W REFLEX MICROSCOPIC
Bilirubin, UA: NEGATIVE
Glucose, UA: NEGATIVE
Leukocytes,UA: NEGATIVE
Nitrite, UA: NEGATIVE
Protein,UA: NEGATIVE
Specific Gravity, UA: 1.025 (ref 1.005–1.030)
Urobilinogen, Ur: 0.2 mg/dL (ref 0.2–1.0)
pH, UA: 5 (ref 5.0–7.5)

## 2019-11-04 LAB — MICROSCOPIC EXAMINATION
Bacteria, UA: NONE SEEN
Epithelial Cells (non renal): NONE SEEN /hpf (ref 0–10)
Renal Epithel, UA: NONE SEEN /hpf
WBC, UA: NONE SEEN /hpf (ref 0–5)

## 2019-11-04 MED ORDER — TAMSULOSIN HCL 0.4 MG PO CAPS: 0.4000 mg | ORAL_CAPSULE | Freq: Every day | ORAL | 3 refills | Status: DC

## 2019-11-04 MED ORDER — MIRABEGRON ER 50 MG PO TB24: 50.0000 mg | ORAL_TABLET | Freq: Every day | ORAL | 3 refills | Status: DC

## 2019-11-04 MED ORDER — TADALAFIL 20 MG PO TABS: 20.0000 mg | ORAL_TABLET | Freq: Every day | ORAL | 5 refills | Status: DC | PRN

## 2019-11-04 MED ORDER — FINASTERIDE 5 MG PO TABS: 5.0000 mg | ORAL_TABLET | Freq: Every day | ORAL | 3 refills | Status: DC

## 2019-11-04 NOTE — Progress Notes (Signed)
Urological Symptom Review  Patient is experiencing the following symptoms: Hard to postpone urination Get up at night to urinate Erection problems (male only)   Review of Systems  Gastrointestinal (upper)  : Negative for upper GI symptoms  Gastrointestinal (lower) : Negative for lower GI symptoms  Constitutional : Negative for symptoms  Skin: Negative for skin symptoms  Eyes: Negative for eye symptoms  Ear/Nose/Throat : Negative for Ear/Nose/Throat symptoms  Hematologic/Lymphatic: Negative for Hematologic/Lymphatic symptoms  Cardiovascular : Negative for cardiovascular symptoms  Respiratory : Negative for respiratory symptoms  Endocrine: Negative for endocrine symptoms  Musculoskeletal: Negative for musculoskeletal symptoms  Neurological: Negative for neurological symptoms  Psychologic: Negative for psychiatric symptoms 

## 2019-11-04 NOTE — Addendum Note (Signed)
Addended by: Pollyann Kennedy F on: 11/04/2019 11:27 AM   Modules accepted: Orders

## 2019-11-04 NOTE — Progress Notes (Signed)
11/04/2019 10:33 AM   Paul Moon [birth year] [id number]  Referring provider: Sonia Side., FNP [address],  [place] [id number]  Followup prostate cancer and ED  HPI: Paul Moon is a 74yo here for followup for prostate cancer, Erectile dysfunction, BPH and pelvic pain. Last PSa was 0.25 in 04/2019. He is currently on flomax and finasteride for BPH. He is on mirabegron 34m for urinary urgency. He has nocturia 1x. Occasional urgency.  He has stable ED for which he takes tadalafil prn.     His records from [facility] are as follows: I have prostate cancer.  HPI: His prostate cancer was diagnosed approximately 12/15/2015. He does have the pathology report from his biopsy. [facility] His PSA at his time of diagnosis was 5.0. His most recent PSA is 1.53.   He has not undergone surgery for treatment. He has not undergone External Beam Radiation Therapy for treatment. He has not undergone Hormonal Therapy for treatment.   He does not have urinary incontinence. He does have problems with erectile dysfunction. He has not recently had unwanted weight loss. He is not having pain in new locations.   12/22/2015: Paul Moon prostate biopsy which revealed Gleason 3+3=6 in 2/12 cores 5-10% of core.  He had hematuria for 7 days which has since resolved.   04/04/2016: He is on AS for Gleason 3+3=6 prostate. He denies any LUTS.   05/24/2016: Pt is here for prostate biopsy   05/30/2016: Repeat biopsy showed gleason 3+3=6 in 4/12 cores 5-20% of core.   06/28/2016: Pt met with Dr MTammi Klippeland he wants to proceed with brachytherapy. He also [facility] to [facility]   10/08/2016: [facility]. [facility] He had an MRI which showed a right base tumor. [facility]  [facility]: [facility] biopsy which per patient  showed 4/16 cores were positive for malignancy. They were Gleason 4+3=7 in 4/16 cores. He was not a candidate for FLA.   03/03/2017: He met with Dr. MTammi Klippeland he has elected to proceed with brachytherapy.   06/09/2017: s/p brachytherapy with [facility]. He had the foley removed n 4/2 and has had worsening nocturia q 5-7x, urgency, frequnecy. no dysuria   08/28/2017: PSA decreased to 2.78. He has frequency q 2hours during the day and every 4 hours at night. He was on mirabegron 28mwith good results.   11/10/2017: PSA decreased to 1.89   03/12/2018: PSA decreased to 1.48   06/16/2018: PSA is stable at 1.51   12/15/2018: PSa stable at 1.53.   I have pelvic pain.  HPI: Paul Moon a [age]ear-old male established patient who is here for pelvic pain.  The problem is on the left side. He first noticed the pain approximately 09/25/2018. He has had this kind of pain before.   His pain is not sharp. The intensity of his pain is rated as a 3. His pain is intermittent. Nothing causes the pain to become worse. None< makes the pain better.   He does not have pain with bowel movements. He does not have trouble with constipation.   10/09/2018: The patient noticed a pain in his left inguinal region 1-2 weeks ago.   03/18/2019: Starting 2 weeks ago he developed suprapubic pain similar to when he was diagnosed  with prostate cancer. He describes it as suprapubic pressure. He has not seen general surgery yet for his left inguinal hernia   04/06/2019: He saw DR. Gross who did not feel he has a left inguinal hernia.     CC: I am having trouble with my erections.  HPI: He first stated noticing pain on approximately 11/03/2014. His symptoms did begin gradually. His symptoms have been worse over the last year.   He does have difficulties achieving an erection. He does have problems maintaining his erections. His erections are not straight.   He does not have premature ejaculation. He does not have trouble reaching  climax. He does have anxiety because of the symptoms.   11/10/2017: He currently doing FIRM-est. He did 2 treatments but he developed worsening LUTS.   01/22/2018: He has done 2 FIRM-EST treatments and has not noted an improvement in his ED. He is currently taking tadalafil daily for the past 10 days. He is have intermittent perineal pain that last 2-3 seconds and then subsides.   03/12/2018: He has done 6 FIRM-est treatments and has noted littl improvement in his ED. He is going to start taking 10-48m of tadalafil for his ED   06/16/2018: FIRM-est treatment improved the quality of his erection. He uses tadalafil 234mprn   10/09/2018: He noted his ED has worsened since his last FIRM Est treatment. He has issues getting a firm erection for penetration and he cannot maintain an erection. libido good. testosterone per patient is over 400   12/15/2018: He has stable ED which he is using PDE5s with mixed results. He has issues with ejaculation and retrograde ejaculation   03/18/2019: The ED has worsened since last visit. He has failed multiple PDE5s. His PCP checked his testosterone and it is normal. He is having relationship issues which may be exacerbating his ED.   04/06/2019: The patient rescheduled trimix teaching since the PDE5s which worked fine for his ED       PMH: Past Medical History:  Diagnosis Date   Arthritis    INDEX FINGER JOINT LEFT HAND, right knee   Bipolar disorder ([place]   Depression    History of gout 2018   right hand 2 fingers and right wrist-- 05-20-2017 per pt resolved   Hyperplasia of prostate with lower urinary tract symptoms (LUTS)    Hypertension    OSA on CPAP    per study 03-22-2013  moderate OSA w/ AHI 26.8/hr   Pinched cervical nerve root    PT STATES C 7-8 PINCHED NERVE CAUSING NUMBNESS MIDDLE, RING AND LITTLE FINGER LEFT HAND - NO PAIN - STATES HE IS TRYING TO AVOID SURGERY.   Pre-diabetes    followed by pcp   [facility]  [facility]  [facility]-  dr maTammi Klippel dx 05-24-2016  Stage T1c, Gleason 4+3, PSA 5.49, vol 65.5cc/  [facility] focal [facility]/  scheduled for radiactive seed implants 05-26-2017   Wears glasses     Surgical History: Past Surgical History:  Procedure Laterality Date   CIRCUMCISION  1973 approx.   KNEE ARTHROSCOPY Left 04/28/2013   Procedure: LEFT ARTHROSCOPY KNEE WITH DEBRIDEMENT meniscal debridement;  Surgeon: FrGearlean AlfMD;  Location: [facility];  Service: Orthopedics;  Laterality: Left;   KNEE SURGERY Right 2008   [place] [facility]  [facility]    [facility] [place]  PROSTATE BIOPSY  05-29-2016   dr Alyson Ingles office   RADIOACTIVE SEED IMPLANT N/A 05/26/2017   Procedure: RADIOACTIVE SEED IMPLANT/BRACHYTHERAPY IMPLANT;  Surgeon: Cleon Gustin, MD;  Location: [facility];  Service: Urology;  Laterality: N/A;   SPACE OAR INSTILLATION N/A 05/26/2017   Procedure: SPACE OAR INSTILLATION;  Surgeon: Cleon Gustin, MD;  Location: [facility];  Service: Urology;  Laterality: N/A;   TONSILLECTOMY  1970s   TOTAL KNEE ARTHROPLASTY N/A 08/23/2019   Procedure: TOTAL KNEE ARTHROPLASTY;  Surgeon: Gaynelle Arabian, MD;  Location: [facility];  Service: Orthopedics;  Laterality: N/A;    Home Medications:  Allergies as of 11/04/2019      Reactions   Hctz [hydrochlorothiazide]    Unknown reaction   Lactose Intolerance (gi)    Lisinopril Cough   Quetiapine Fumarate Er Other (See Comments)   Risperdal [risperidone] Other (See Comments)   "not be lucid, be foggy"   Seroquel [quetiapine Fumarate] Other (See Comments)   Joint swelling      Medication List       Accurate as of November 04, 2019 10:33 AM. If you have any questions, ask your nurse or doctor.        allopurinol 300 MG tablet Commonly known as: ZYLOPRIM Take 300 mg by mouth daily.     donepezil 10 MG tablet Commonly known as: ARICEPT Take 1 tablet (10 mg total) by mouth at bedtime. Future refills to be managed by PCP. What changed: additional instructions   finasteride 5 MG tablet Commonly known as: PROSCAR Take 5 mg by mouth daily.   gabapentin 300 MG capsule Commonly known as: NEURONTIN Take a 300 mg capsule three times a day for two weeks following surgery.Then take a 300 mg capsule two times a day for two weeks. Then take a 300 mg capsule once a day for two weeks. Then discontinue.   losartan 100 MG tablet Commonly known as: COZAAR Take 100 mg by mouth at bedtime.   magnesium oxide 400 MG tablet Commonly known as: MAG-OX Take 400 mg by mouth daily.   memantine 10 MG tablet Commonly known as: Namenda Take 1 tablet (10 mg total) by mouth 2 (two) times daily.   methocarbamol 500 MG tablet Commonly known as: ROBAXIN Take 1 tablet (500 mg total) by mouth every 6 (six) hours as needed for muscle spasms.   Myrbetriq 50 MG Tb24 tablet Generic drug: mirabegron ER Take 50 mg by mouth daily.   oxyCODONE 5 MG immediate release tablet Commonly known as: Oxy IR/ROXICODONE Take 1-2 tablets (5-10 mg total) by mouth every 6 (six) hours as needed for severe pain.   rivaroxaban 10 MG Tabs tablet Commonly known as: XARELTO Take 1 tablet (10 mg total) by mouth daily with breakfast for 20 days. Then resume one 81 mg aspirin once a day.   tadalafil 20 MG tablet Commonly known as: CIALIS Take 20 mg by mouth daily as needed for erectile dysfunction.   tamsulosin 0.4 MG Caps capsule Commonly known as: FLOMAX Take 0.4 mg by mouth daily.   traMADol 50 MG tablet Commonly known as: ULTRAM Take 1-2 tablets (50-100 mg total) by mouth every 6 (six) hours as needed for moderate pain.       Allergies:  Allergies  Allergen Reactions   Hctz [Hydrochlorothiazide]     Unknown reaction   Lactose Intolerance (Gi)    Lisinopril Cough   Quetiapine Fumarate Er Other  (See Comments)   Risperdal [Risperidone] Other (  See Comments)    "not be lucid, be foggy"   Seroquel [Quetiapine Fumarate] Other (See Comments)    Joint swelling    Family History: Family History  Problem Relation Age of Onset   Renal cancer Father    Heart attack Paternal Grandmother    Heart attack Paternal Grandfather    Cancer Paternal Aunt        breast    Social History:  reports that he quit smoking about 34 years ago. His smoking use included cigarettes. He has a 30.00 pack-year smoking history. He has never used smokeless tobacco. He reports current alcohol use. He reports that he does not use drugs.  ROS: All other review of systems were reviewed and are negative except what is noted above in HPI  Physical Exam: There were no vitals taken for this visit.  Constitutional:  Alert and oriented, No acute distress. HEENT: [place] AT, moist mucus membranes.  Trachea midline, no masses. Cardiovascular: No clubbing, cyanosis, or edema. Respiratory: Normal respiratory effort, no increased work of breathing. GI: Abdomen is soft, nontender, nondistended, no abdominal masses GU: No CVA tenderness. Circumcised phallus. No masses/lesions on penis, testis, scrotum. Prostate 30g smooth no nodules no induration.  Lymph: No cervical or inguinal lymphadenopathy. Skin: No rashes, bruises or suspicious lesions. Neurologic: Grossly intact, no focal deficits, moving all 4 extremities. Psychiatric: Normal mood and affect.  Laboratory Data: Lab Results  Component Value Date   WBC 8.1 08/24/2019   HGB 11.9 (L) 08/24/2019   HCT 35.6 (L) 08/24/2019   MCV 93.0 08/24/2019   PLT 248 08/24/2019    Lab Results  Component Value Date   CREATININE 1.22 08/24/2019    No results found for: PSA  No results found for: TESTOSTERONE  No results found for: HGBA1C  Urinalysis No results found for: COLORURINE, APPEARANCEUR, LABSPEC, PHURINE, GLUCOSEU, HGBUR, BILIRUBINUR, KETONESUR, PROTEINUR,  UROBILINOGEN, NITRITE, LEUKOCYTESUR  No results found for: LABMICR, [place], RBCUA, LABEPIT, MUCUS, BACTERIA  Pertinent Imaging:  No results found for this or any previous visit.  No results found for this or any previous visit.  No results found for this or any previous visit.  No results found for this or any previous visit.  No results found for this or any previous visit.  No results found for this or any previous visit.  No results found for this or any previous visit.  No results found for this or any previous visit.   Assessment & Plan:    1. Frequency of micturition -continue flomax, finasteride  - Urinalysis, Routine w reflex microscopic  2. Prostate cancer ([place]) -PSA today -RTC 6 months with PSA  3. OAB (overactive bladder) -Mirabegron 14m  4. Erectile dysfunction due to arterial insufficiency -tadalafil 230mprn   No follow-ups on file.  PaNicolette BangMD  [facility] [place]

## 2019-11-05 LAB — PSA: Prostate Specific Ag, Serum: 0.6 ng/mL (ref 0.0–4.0)

## 2019-11-10 ENCOUNTER — Telehealth: Payer: Self-pay | Admitting: Urology

## 2019-11-10 NOTE — Telephone Encounter (Signed)
Pt called wanting to speak with someone about his last visit with [facility].

## 2019-11-10 NOTE — Progress Notes (Signed)
Sent via mychart

## 2019-11-11 NOTE — Telephone Encounter (Signed)
Pt called asking for PSA results. Per Dr. Alyson Ingles the number was given to pt as normal. Also pt has my chart and will re-activate

## 2019-11-15 ENCOUNTER — Ambulatory Visit: Payer: Medicare Other | Admitting: Adult Health

## 2019-11-15 VITALS — BP 148/71 | HR 81 | Ht 67.5 in | Wt 211.8 lb

## 2019-11-15 DIAGNOSIS — R413 Other amnesia: Secondary | ICD-10-CM | POA: Diagnosis not present

## 2019-11-15 NOTE — Progress Notes (Signed)
PATIENT: Paul Moon DOB: Jun 07, 1945  REASON FOR VISIT: follow up HISTORY FROM: patient  HISTORY OF PRESENT ILLNESS: Today 11/15/19:  Paul Moon is a 74 year old male with a history of memory disturbance.  He returns today for follow-up.  He reports overall he has been stable.  He is able to complete all ADLs independently.  He operates a Teacher, music without difficulty.  He manages his own finances.  He reports that he is semiretired.  Continues to work part-time.  He returns today for an evaluation.  HISTORY 11/24/18:  Paul Moon is a 74 year old male with a history of memory loss.  He returns today for follow-up.  He reports that his memory has been stable.  He is able to complete all ADLs independently.  He manages his own finances without difficulty.  He does not do any cooking but this has been the norm for him.  Denies any changes in his mood or behavior.  He states that his significant other feels that his memory is worse.  He states that if he forgets to get something at the grocery store she considers that a problem with his memory.  However he states that sometimes he is just preoccupied with other things and does not always process what she is asking.  He continues on Namenda and Aricept.  Tolerates these medications well.  Returns today for an evaluation.  REVIEW OF SYSTEMS: Out of a complete 14 system review of symptoms, the patient complains only of the following symptoms, and all other reviewed systems are negative.  See HPI  ALLERGIES: Allergies  Allergen Reactions  . Hctz [Hydrochlorothiazide]     Unknown reaction  . Lactose Intolerance (Gi)   . Lisinopril Cough  . Quetiapine Fumarate Er Other (See Comments)  . Risperdal [Risperidone] Other (See Comments)    "not be lucid, be foggy"  . Seroquel [Quetiapine Fumarate] Other (See Comments)    Joint swelling    HOME MEDICATIONS: Outpatient Medications Prior to Visit  Medication Sig Dispense Refill  .  allopurinol (ZYLOPRIM) 300 MG tablet Take 300 mg by mouth daily.    Marland Kitchen amLODipine (NORVASC) 10 MG tablet Take 10 mg by mouth daily.    Marland Kitchen donepezil (ARICEPT) 10 MG tablet Take 1 tablet (10 mg total) by mouth at bedtime. Future refills to be managed by PCP. (Patient taking differently: Take 10 mg by mouth at bedtime. ) 90 tablet 2  . finasteride (PROSCAR) 5 MG tablet Take 1 tablet (5 mg total) by mouth daily. 90 tablet 3  . losartan (COZAAR) 100 MG tablet Take 100 mg by mouth at bedtime.     . magnesium oxide (MAG-OX) 400 MG tablet Take 400 mg by mouth daily.    . memantine (NAMENDA) 10 MG tablet Take 1 tablet (10 mg total) by mouth 2 (two) times daily. 180 tablet 2  . mirabegron ER (MYRBETRIQ) 50 MG TB24 tablet Take 1 tablet (50 mg total) by mouth daily. 90 tablet 3  . Prednisol Ace-Moxiflox-Bromfen 1-0.5-0.075 % SUSP Place 1 drop into the right eye 4 (four) times daily.    . tadalafil (CIALIS) 20 MG tablet Take 1 tablet (20 mg total) by mouth daily as needed for erectile dysfunction. 10 tablet 5  . tamsulosin (FLOMAX) 0.4 MG CAPS capsule Take 1 capsule (0.4 mg total) by mouth daily. 90 capsule 3  . gabapentin (NEURONTIN) 300 MG capsule Take a 300 mg capsule three times a day for two weeks following surgery.Then take a 300  mg capsule two times a day for two weeks. Then take a 300 mg capsule once a day for two weeks. Then discontinue. 84 capsule 0  . methocarbamol (ROBAXIN) 500 MG tablet Take 1 tablet (500 mg total) by mouth every 6 (six) hours as needed for muscle spasms. (Patient not taking: Reported on 11/04/2019) 40 tablet 0  . oxyCODONE (OXY IR/ROXICODONE) 5 MG immediate release tablet Take 1-2 tablets (5-10 mg total) by mouth every 6 (six) hours as needed for severe pain. (Patient not taking: Reported on 11/04/2019) 56 tablet 0  . rivaroxaban (XARELTO) 10 MG TABS tablet Take 1 tablet (10 mg total) by mouth daily with breakfast for 20 days. Then resume one 81 mg aspirin once a day. 20 tablet 0  .  traMADol (ULTRAM) 50 MG tablet Take 1-2 tablets (50-100 mg total) by mouth every 6 (six) hours as needed for moderate pain. (Patient not taking: Reported on 11/04/2019) 40 tablet 0   No facility-administered medications prior to visit.    PAST MEDICAL HISTORY: Past Medical History:  Diagnosis Date  . Arthritis    INDEX FINGER JOINT LEFT HAND, right knee  . Bipolar disorder ([place])   . Depression   . History of gout 2018   right hand 2 fingers and right wrist-- 05-20-2017 per pt resolved  . Hyperplasia of prostate with lower urinary tract symptoms (LUTS)   . Hypertension   . OSA on CPAP    per study 03-22-2013  moderate OSA w/ AHI 26.8/hr  . Pinched cervical nerve root    PT STATES C 7-8 PINCHED NERVE CAUSING NUMBNESS MIDDLE, RING AND LITTLE FINGER LEFT HAND - NO PAIN - STATES HE IS TRYING TO AVOID SURGERY.  . Pre-diabetes    followed by pcp  . Prostate cancer [facility]) urologist-  dr Alyson Ingles  oncologist-  dr Tammi Klippel   dx 05-24-2016  Stage T1c, Gleason 4+3, PSA 5.49, vol 65.5cc/  pt seek [facility] opinion's at [facility] not candidate for focal laser ablation/  scheduled for radiactive seed implants 05-26-2017  . Wears glasses     PAST SURGICAL HISTORY: Past Surgical History:  Procedure Laterality Date  . CIRCUMCISION  1973 approx.  Marland Kitchen KNEE ARTHROSCOPY Left 04/28/2013   Procedure: LEFT ARTHROSCOPY KNEE WITH DEBRIDEMENT meniscal debridement;  Surgeon: Gearlean Alf, MD;  Location: [facility];  Service: Orthopedics;  Laterality: Left;  . KNEE SURGERY Right 2008  . Paul GUIDED PROSTATE BIOPSY  12-12-2016    [facility], [place])   w/ general anesthesia  . PROSTATE BIOPSY  05-29-2016   dr Alyson Ingles office  . RADIOACTIVE SEED IMPLANT N/A 05/26/2017   Procedure: RADIOACTIVE SEED IMPLANT/BRACHYTHERAPY IMPLANT;  Surgeon: Cleon Gustin, MD;  Location: [facility];  Service: Urology;  Laterality: N/A;  . SPACE OAR INSTILLATION N/A 05/26/2017   Procedure: SPACE OAR  INSTILLATION;  Surgeon: Cleon Gustin, MD;  Location: [facility];  Service: Urology;  Laterality: N/A;  . TONSILLECTOMY  1970s  . TOTAL KNEE ARTHROPLASTY N/A 08/23/2019   Procedure: TOTAL KNEE ARTHROPLASTY;  Surgeon: Gaynelle Arabian, MD;  Location: [facility];  Service: Orthopedics;  Laterality: N/A;    FAMILY HISTORY: Family History  Problem Relation Age of Onset  . Renal cancer Father   . Heart attack Paternal Grandmother   . Heart attack Paternal Grandfather   . Cancer Paternal Aunt        breast    SOCIAL HISTORY: Social History   Socioeconomic History  .  Marital status: Divorced    Spouse name: Not on file  . Number of children: 2  . Years of education: College  . Highest education level: Not on file  Occupational History  . Occupation: Semi-Retired  Tobacco Use  . Smoking status: Former Smoker    Packs/day: 1.50    Years: 20.00    Pack years: 30.00    Types: Cigarettes    Quit date: 05/26/1985    Years since quitting: 34.4  . Smokeless tobacco: Never Used  Vaping Use  . Vaping Use: Never used  Substance and Sexual Activity  . Alcohol use: Yes    Comment: occasional wine  . Drug use: No  . Sexual activity: Yes  Other Topics Concern  . Not on file  Social History Narrative   Patient lives at home alone.   Caffeine Use: occasionally   Social Determinants of Health   Financial Resource Strain:   . Difficulty of Paying Living Expenses: Not on file  Food Insecurity:   . Worried About Charity fundraiser in the Last Year: Not on file  . Ran Out of Food in the Last Year: Not on file  Transportation Needs:   . Lack of Transportation (Medical): Not on file  . Lack of Transportation (Non-Medical): Not on file  Physical Activity:   . Days of Exercise per Week: Not on file  . Minutes of Exercise per Session: Not on file  Stress:   . Feeling of Stress : Not on file  Social Connections:   . Frequency of Communication with Friends and Family: Not  on file  . Frequency of Social Gatherings with Friends and Family: Not on file  . Attends Religious Services: Not on file  . Active Member of Clubs or Organizations: Not on file  . Attends Archivist Meetings: Not on file  . Marital Status: Not on file  Intimate Partner Violence:   . Fear of Current or Ex-Partner: Not on file  . Emotionally Abused: Not on file  . Physically Abused: Not on file  . Sexually Abused: Not on file      PHYSICAL EXAM  Vitals:   11/15/19 0859  BP: (!) 148/71  Pulse: 81  Weight: 211 lb 12.8 oz (96.1 kg)  Height: 5' 7.5" (1.715 m)   Body mass index is 32.68 kg/m.   MMSE - Mini Mental State Exam 11/15/2019 11/24/2018 11/18/2017  Orientation to time 5 5 5   Orientation to Place 5 5 5   Registration 3 3 3   Attention/ Calculation 5 5 5   Recall 3 3 3   Language- name 2 objects 2 2 2   Language- repeat 1 1 1   Language- follow 3 step command 3 3 3   Language- read & follow direction 1 1 1   Write a sentence 1 1 1   Copy design 1 1 1   Total score 30 30 30      Generalized: Well developed, in no acute distress   Neurological examination  Mentation: Alert oriented to time, place, history taking. Follows all commands speech and language fluent Cranial nerve II-XII: Pupils were equal round reactive to light. Extraocular movements were full, visual field were full on confrontational test.  Head turning and shoulder shrug  were normal and symmetric. Motor: The motor testing reveals 5 over 5 strength of all 4 extremities. Good symmetric motor tone is noted throughout.  Sensory: Sensory testing is intact to soft touch on all 4 extremities. No evidence of extinction is noted.  Coordination: Cerebellar  testing reveals good finger-nose-finger and heel-to-shin bilaterally.  Gait and station: Gait is normal.  Reflexes: Deep tendon reflexes are symmetric and normal bilaterally.   DIAGNOSTIC DATA (LABS, IMAGING, TESTING) - I reviewed patient records, labs,  notes, testing and imaging myself where available.  Lab Results  Component Value Date   WBC 8.1 08/24/2019   HGB 11.9 (L) 08/24/2019   HCT 35.6 (L) 08/24/2019   MCV 93.0 08/24/2019   PLT 248 08/24/2019      Component Value Date/Time   NA 134 (L) 08/24/2019 0301   K 4.4 08/24/2019 0301   CL 101 08/24/2019 0301   CO2 24 08/24/2019 0301   GLUCOSE 150 (H) 08/24/2019 0301   BUN 13 08/24/2019 0301   CREATININE 1.22 08/24/2019 0301   CALCIUM 8.6 (L) 08/24/2019 0301   PROT 7.4 08/16/2019 0844   ALBUMIN 4.0 08/16/2019 0844   AST 23 08/16/2019 0844   ALT 23 08/16/2019 0844   ALKPHOS 73 08/16/2019 0844   BILITOT 0.7 08/16/2019 0844   GFRNONAA 58 (L) 08/24/2019 0301   GFRAA >60 08/24/2019 0301    Lab Results  Component Value Date   TSH 2.620 03/22/2013      ASSESSMENT AND PLAN 74 y.o. year old male  has a past medical history of Arthritis, Bipolar disorder ([place]), Depression, History of gout (2018), Hyperplasia of prostate with lower urinary tract symptoms (LUTS), Hypertension, OSA on CPAP, Pinched cervical nerve root, Pre-diabetes, Prostate cancer [facility]) (urologist-  dr Alyson Ingles  oncologist-  dr Tammi Klippel), and Wears glasses. here with:  1.  Memory disturbance   Memory score has remained stable over the last several visits.MMSE 30/30  Continue Aricept and Namenda  Advised that he can return to his primary care as long as they are amenable to continuing Aricept and Namenda  follow-up with our office on an as-needed basis   I spent 25 minutes of face-to-face and non-face-to-face time with patient.  This included previsit chart review, lab review, study review, order entry, electronic health record documentation, patient education.  Ward Givens, MSN, NP-C 11/15/2019, 9:01 AM [facility] [address], [place], [place] [address] [phone]

## 2019-11-15 NOTE — Patient Instructions (Signed)
Your Plan:  Continue aricept and namenda Return to PCP- as long as they are amendable to prescribing Namenda and Aricept Memory score is stable If your symptoms worsen or you develop new symptoms please let us know.       Thank you for coming to see [place] at [facility]. I hope we have been able to provide you high quality care today.  You may receive a patient satisfaction survey over the next few weeks. We would appreciate your feedback and comments so that we may continue to improve ourselves and the health of our patients.

## 2020-01-12 ENCOUNTER — Other Ambulatory Visit: Payer: Self-pay | Admitting: Adult Health

## 2020-01-17 ENCOUNTER — Telehealth: Payer: Self-pay

## 2020-01-17 NOTE — Telephone Encounter (Signed)
Pt called c/o lower abd pain, increased urgency and out of Myrbetriq. Said he doesn't know if some of this is from radiation. Appt made for the 18th.

## 2020-01-18 IMAGING — CR DG CHEST 2V
2 series · 2 of 2 positions shown · non-contrast
Comparison: 07/21/2014

CLINICAL DATA: Preoperative evaluation for upcoming procedure

EXAM:
CHEST  2 VIEW

[w chest pa]
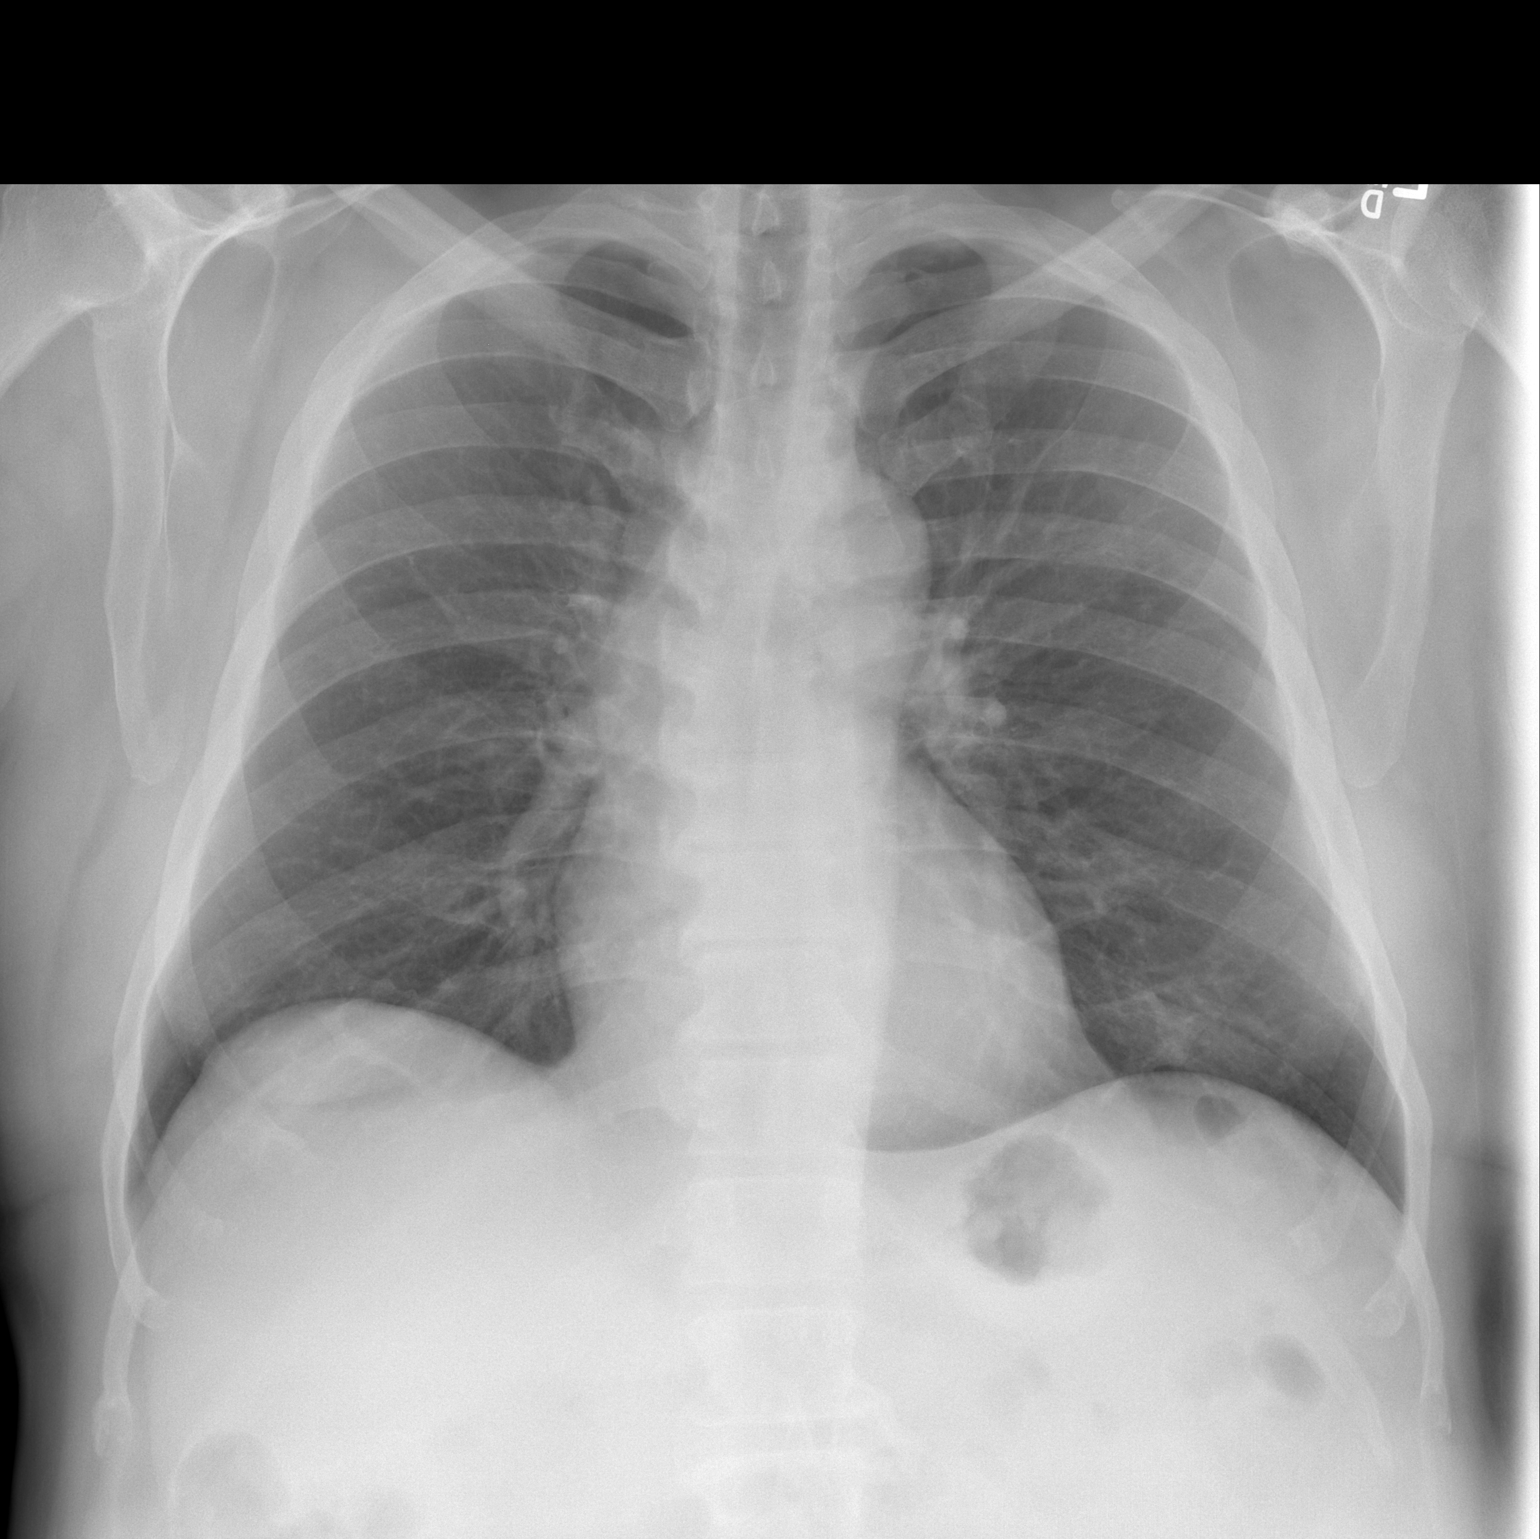

[w chest lat]
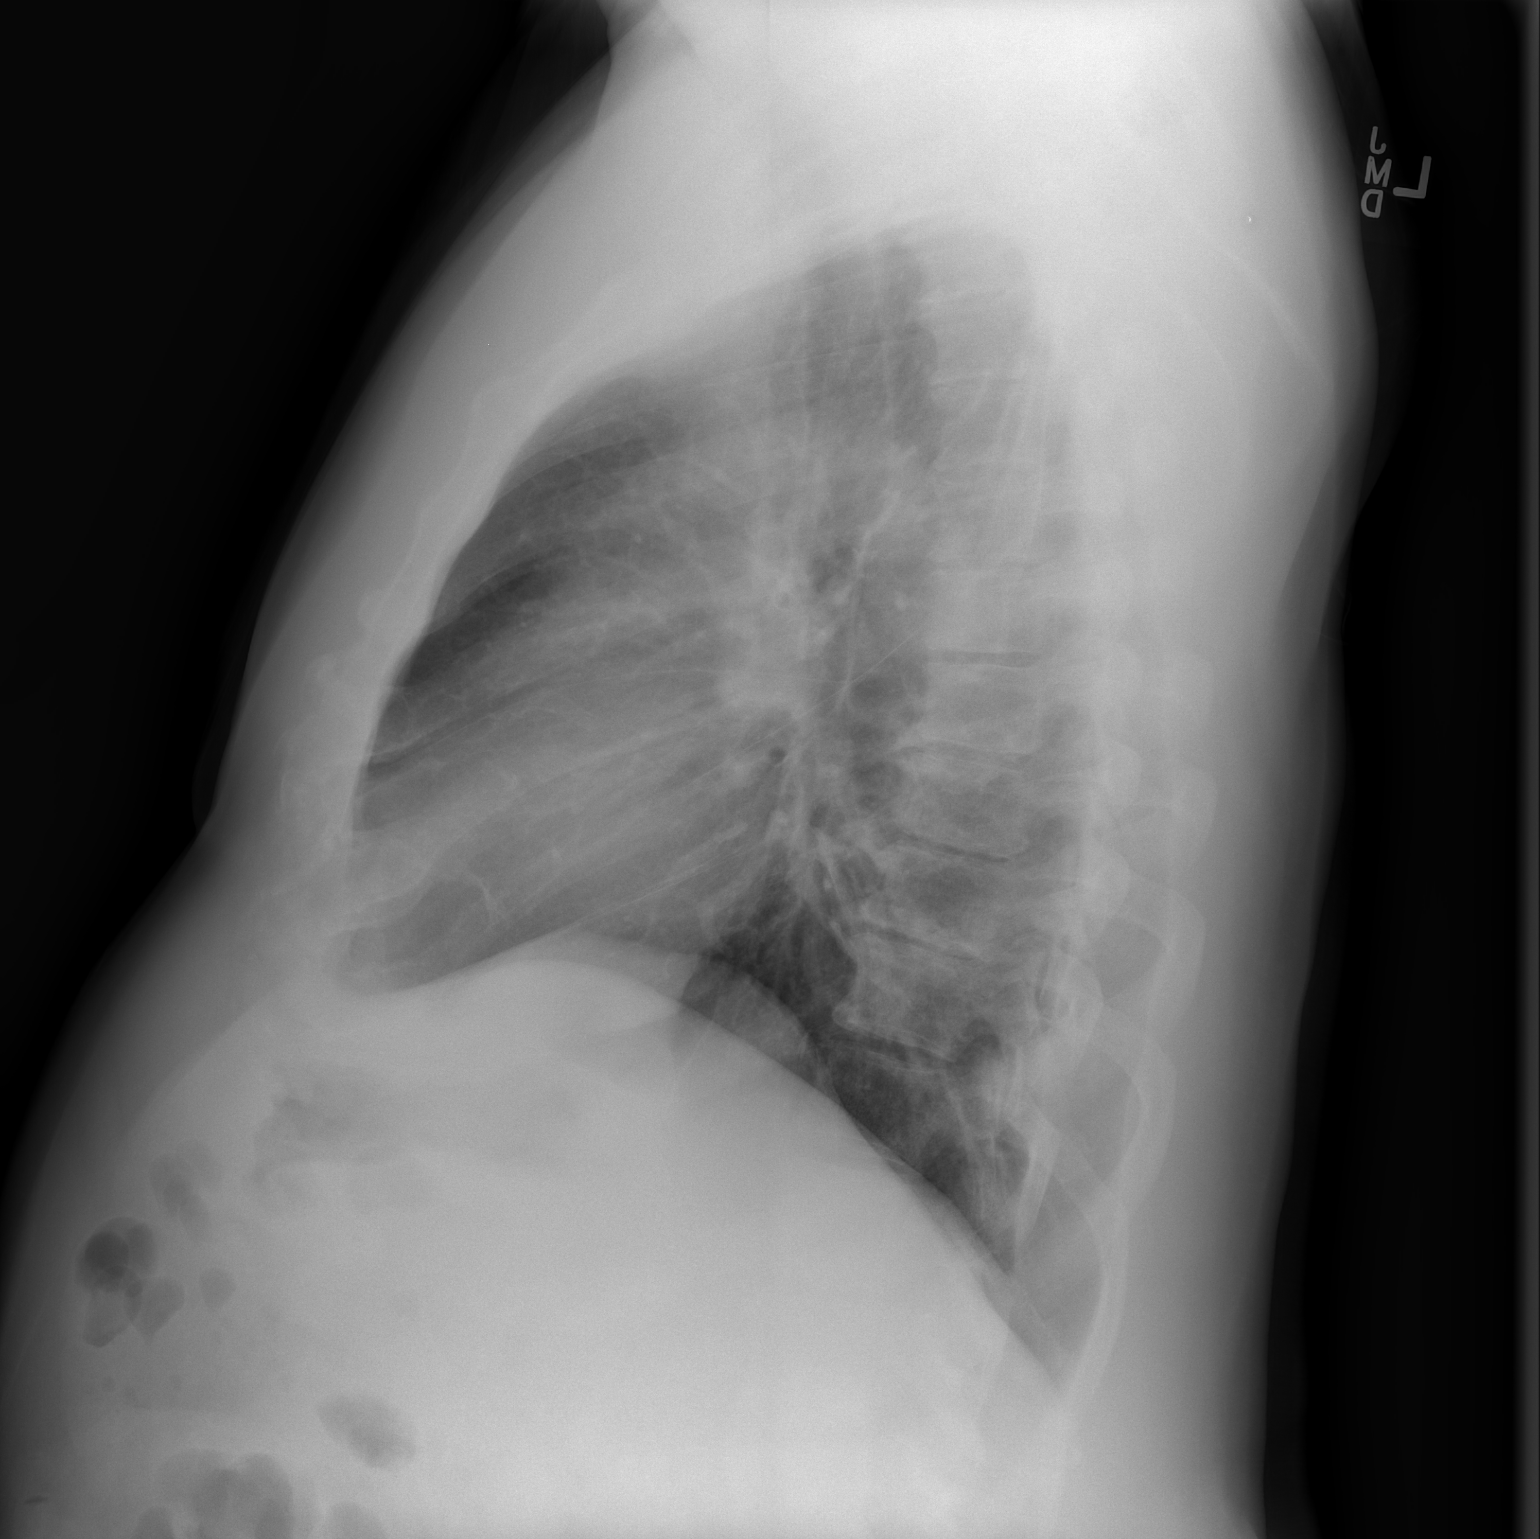

[2 of 2 positions shown; findings below may reference images not displayed]

FINDINGS: The heart size and mediastinal contours are within normal limits.
Both lungs are clear. The visualized skeletal structures are
unremarkable.
IMPRESSION: No active cardiopulmonary disease.

## 2020-01-20 ENCOUNTER — Other Ambulatory Visit: Payer: Self-pay

## 2020-01-20 ENCOUNTER — Ambulatory Visit (INDEPENDENT_AMBULATORY_CARE_PROVIDER_SITE_OTHER): Payer: Medicare Other | Admitting: Urology

## 2020-01-20 ENCOUNTER — Ambulatory Visit: Payer: Medicare Other | Admitting: Urology

## 2020-01-20 ENCOUNTER — Encounter: Payer: Self-pay | Admitting: Urology

## 2020-01-20 VITALS — BP 147/68 | HR 90 | Temp 98.6°F | Wt 208.0 lb

## 2020-01-20 DIAGNOSIS — N3281 Overactive bladder: Secondary | ICD-10-CM | POA: Diagnosis not present

## 2020-01-20 DIAGNOSIS — N5201 Erectile dysfunction due to arterial insufficiency: Secondary | ICD-10-CM

## 2020-01-20 LAB — URINALYSIS, ROUTINE W REFLEX MICROSCOPIC
Bilirubin, UA: NEGATIVE
Glucose, UA: NEGATIVE
Leukocytes,UA: NEGATIVE
Nitrite, UA: NEGATIVE
Protein,UA: NEGATIVE
RBC, UA: NEGATIVE
Specific Gravity, UA: 1.025 (ref 1.005–1.030)
Urobilinogen, Ur: 0.2 mg/dL (ref 0.2–1.0)
pH, UA: 5 (ref 5.0–7.5)

## 2020-01-20 LAB — BLADDER SCAN AMB NON-IMAGING: Scan Result: 2

## 2020-01-20 NOTE — Progress Notes (Signed)
Urological Symptom Review  Patient is experiencing the following symptoms: Frequent urination Get up at night to urinate Erection problems (male only)   Review of Systems  Gastrointestinal (upper)  : Negative for upper GI symptoms  Gastrointestinal (lower) : Negative for lower GI symptoms  Constitutional : Negative for symptoms  Skin: Negative for skin symptoms  Eyes: Negative for eye symptoms  Ear/Nose/Throat : Negative for Ear/Nose/Throat symptoms  Hematologic/Lymphatic: Negative for Hematologic/Lymphatic symptoms  Cardiovascular : Negative for cardiovascular symptoms  Respiratory : Negative for respiratory symptoms  Endocrine: Negative for endocrine symptoms  Musculoskeletal: Negative for musculoskeletal symptoms  Neurological: Negative for neurological symptoms  Psychologic: Negative for psychiatric symptoms  

## 2020-01-20 NOTE — Progress Notes (Signed)
01/20/2020 9:17 AM   Paul Moon 17-Aug-1945 [id number]  Referring provider: [address] [place],  [place] [address]  Urinary frequency  HPI: Mr Coye is a 74yo here for worsening urinary frequency and urgency. He was on mirabegron 50mg  but ran out of the medication. He is having worsening issues getting an erection. He takes tadalafil 20mg  prn.     PMH: Past Medical History:  Diagnosis Date   Arthritis    INDEX FINGER JOINT LEFT HAND, right knee   Bipolar disorder ([place])    Depression    History of gout 2018   right hand 2 fingers and right wrist-- 05-20-2017 per pt resolved   Hyperplasia of prostate with lower urinary tract symptoms (LUTS)    Hypertension    OSA on CPAP    per study 03-22-2013  moderate OSA w/ AHI 26.8/hr   Pinched cervical nerve root    PT STATES C 7-8 PINCHED NERVE CAUSING NUMBNESS MIDDLE, RING AND LITTLE FINGER LEFT HAND - NO PAIN - STATES HE IS TRYING TO AVOID SURGERY.   Pre-diabetes    followed by pcp   Prostate cancer [facility]) urologist-  dr Alyson Ingles  oncologist-  dr Tammi Klippel   dx 05-24-2016  Stage T1c, Gleason 4+3, PSA 5.49, vol 65.5cc/  pt seek [facility] opinion's at [facility] and [facility] not candidate for focal laser ablation/  scheduled for radiactive seed implants 05-26-2017   Wears glasses     Surgical History: Past Surgical History:  Procedure Laterality Date   CIRCUMCISION  1973 approx.   KNEE ARTHROSCOPY Left 04/28/2013   Procedure: LEFT ARTHROSCOPY KNEE WITH DEBRIDEMENT meniscal debridement;  Surgeon: Gearlean Alf, MD;  Location: [facility];  Service: Orthopedics;  Laterality: Left;   KNEE SURGERY Right 2008   MR GUIDED PROSTATE BIOPSY  12-12-2016    [facility] [place], [place])   w/ general anesthesia   PROSTATE BIOPSY  05-29-2016   dr Alyson Ingles office   RADIOACTIVE SEED IMPLANT N/A 05/26/2017   Procedure: RADIOACTIVE SEED IMPLANT/BRACHYTHERAPY IMPLANT;  Surgeon: Cleon Gustin, MD;  Location:  [facility];  Service: Urology;  Laterality: N/A;   SPACE OAR INSTILLATION N/A 05/26/2017   Procedure: SPACE OAR INSTILLATION;  Surgeon: Cleon Gustin, MD;  Location: [facility];  Service: Urology;  Laterality: N/A;   TONSILLECTOMY  1970s   TOTAL KNEE ARTHROPLASTY N/A 08/23/2019   Procedure: TOTAL KNEE ARTHROPLASTY;  Surgeon: Gaynelle Arabian, MD;  Location: [facility];  Service: Orthopedics;  Laterality: N/A;    Home Medications:  Allergies as of 01/20/2020      Reactions   Lisinopril Cough   Hctz [hydrochlorothiazide]    Unknown reaction   Lactose Intolerance (gi)    Quetiapine Fumarate Er Other (See Comments)   Risperdal [risperidone] Other (See Comments)   "not be lucid, be foggy"   Seroquel [quetiapine Fumarate] Other (See Comments)   Joint swelling      Medication List       Accurate as of January 20, 2020  9:17 AM. If you have any questions, ask your nurse or doctor.        STOP taking these medications   Prednisol Ace-Moxiflox-Bromfen 1-0.5-0.075 % Susp Stopped by: [name], MD     TAKE these medications   allopurinol 300 MG tablet Commonly known as: ZYLOPRIM Take 300 mg by mouth daily.   amLODipine 10 MG tablet Commonly known as: NORVASC Take 10 mg by mouth daily.  donepezil 10 MG tablet Commonly known as: ARICEPT Take 1 tablet (10 mg total) by mouth at bedtime. Future refills to be managed by PCP. What changed: additional instructions   finasteride 5 MG tablet Commonly known as: PROSCAR Take 1 tablet (5 mg total) by mouth daily.   losartan 100 MG tablet Commonly known as: COZAAR Take 100 mg by mouth at bedtime.   magnesium oxide 400 MG tablet Commonly known as: MAG-OX Take 400 mg by mouth daily.   memantine 10 MG tablet Commonly known as: NAMENDA TAKE 1 TABLET(10 MG) BY MOUTH TWICE DAILY   mirabegron ER 50 MG Tb24 tablet Commonly known as: Myrbetriq Take 1 tablet (50 mg total) by mouth daily.     tadalafil 20 MG tablet Commonly known as: CIALIS Take 1 tablet (20 mg total) by mouth daily as needed for erectile dysfunction.   tamsulosin 0.4 MG Caps capsule Commonly known as: FLOMAX Take 1 capsule (0.4 mg total) by mouth daily.       Allergies:  Allergies  Allergen Reactions   Lisinopril Cough   Hctz [Hydrochlorothiazide]     Unknown reaction   Lactose Intolerance (Gi)    Quetiapine Fumarate Er Other (See Comments)   Risperdal [Risperidone] Other (See Comments)    "not be lucid, be foggy"   Seroquel [Quetiapine Fumarate] Other (See Comments)    Joint swelling    Family History: Family History  Problem Relation Age of Onset   Renal cancer Father    Heart attack Paternal Grandmother    Heart attack Paternal Grandfather    Cancer Paternal Aunt        breast    Social History:  reports that he quit smoking about 34 years ago. His smoking use included cigarettes. He has a 30.00 pack-year smoking history. He has never used smokeless tobacco. He reports current alcohol use. He reports that he does not use drugs.  ROS: All other review of systems were reviewed and are negative except what is noted above in HPI  Physical Exam: BP (!) 147/68    Pulse 90    Temp 98.6 F (37 C)    Wt 208 lb (94.3 kg)    BMI 32.10 kg/m   Constitutional:  Alert and oriented, No acute distress. HEENT:  AT, moist mucus membranes.  Trachea midline, no masses. Cardiovascular: No clubbing, cyanosis, or edema. Respiratory: Normal respiratory effort, no increased work of breathing. GI: Abdomen is soft, nontender, nondistended, no abdominal masses GU: No CVA tenderness.  Lymph: No cervical or inguinal lymphadenopathy. Skin: No rashes, bruises or suspicious lesions. Neurologic: Grossly intact, no focal deficits, moving all 4 extremities. Psychiatric: Normal mood and affect.  Laboratory Data: Lab Results  Component Value Date   WBC 8.1 08/24/2019   HGB 11.9 (L) 08/24/2019   HCT  35.6 (L) 08/24/2019   MCV 93.0 08/24/2019   PLT 248 08/24/2019    Lab Results  Component Value Date   CREATININE 1.22 08/24/2019    No results found for: PSA  No results found for: TESTOSTERONE  No results found for: HGBA1C  Urinalysis    Component Value Date/Time   APPEARANCEUR Clear 11/04/2019 1032   GLUCOSEU Negative 11/04/2019 1032   BILIRUBINUR Negative 11/04/2019 1032   PROTEINUR Negative 11/04/2019 1032   NITRITE Negative 11/04/2019 1032   LEUKOCYTESUR Negative 11/04/2019 1032    Lab Results  Component Value Date   LABMICR See below: 11/04/2019   WBCUA None seen 11/04/2019   LABEPIT None seen 11/04/2019  MUCUS Present 11/04/2019   BACTERIA None seen 11/04/2019    Pertinent Imaging:  No results found for this or any previous visit.  No results found for this or any previous visit.  No results found for this or any previous visit.  No results found for this or any previous visit.  No results found for this or any previous visit.  No results found for this or any previous visit.  No results found for this or any previous visit.  No results found for this or any previous visit.   Assessment & Plan:    1. OAB (overactive bladder) -we will restart mirabegron 50mg   - BLADDER SCAN AMB NON-IMAGING - Urinalysis, Routine w reflex microscopic  2. Erectile dysfunction -We will stop finasteride -continue tadalafil 20mg  prn   No follow-ups on file.  [name], MD  [facility] [place]

## 2020-01-20 NOTE — Patient Instructions (Signed)

## 2020-02-05 ENCOUNTER — Other Ambulatory Visit: Payer: Self-pay | Admitting: Adult Health

## 2020-02-21 ENCOUNTER — Other Ambulatory Visit: Payer: Self-pay | Admitting: Adult Health

## 2020-02-29 ENCOUNTER — Other Ambulatory Visit: Payer: Self-pay

## 2020-02-29 DIAGNOSIS — N3281 Overactive bladder: Secondary | ICD-10-CM

## 2020-02-29 MED ORDER — MIRABEGRON ER 50 MG PO TB24: 50.0000 mg | ORAL_TABLET | Freq: Every day | ORAL | 3 refills | Status: DC

## 2020-04-19 ENCOUNTER — Telehealth: Payer: Self-pay | Admitting: Adult Health

## 2020-04-19 NOTE — Telephone Encounter (Signed)
Pt is asking for a call to discuss the recent medication that was refused a refill, he would like to know which and why

## 2020-04-20 NOTE — Telephone Encounter (Signed)
I called pt.  He was trying to understand why refusing his donepezil.  I relayed that last scripts notes to get by pcp.  [facility]  [facility] sent prescription to [facility] for memantine. He will call pcp, and check if they will fill for him.  If they decline then will have to see once yearly for refills.  He verbalized understanding.

## 2020-04-26 ENCOUNTER — Other Ambulatory Visit: Payer: Medicare Other

## 2020-04-26 ENCOUNTER — Other Ambulatory Visit: Payer: Self-pay

## 2020-04-27 LAB — PSA: Prostate Specific Ag, Serum: 0.4 ng/mL (ref 0.0–4.0)

## 2020-05-02 NOTE — Progress Notes (Signed)
Sent via mychart

## 2020-05-03 ENCOUNTER — Encounter: Payer: Self-pay | Admitting: Urology

## 2020-05-03 ENCOUNTER — Ambulatory Visit (INDEPENDENT_AMBULATORY_CARE_PROVIDER_SITE_OTHER): Payer: Medicare Other | Admitting: Urology

## 2020-05-03 ENCOUNTER — Other Ambulatory Visit: Payer: Self-pay

## 2020-05-03 VITALS — BP 147/64 | HR 79 | Temp 98.6°F | Ht 67.5 in | Wt 222.0 lb

## 2020-05-03 DIAGNOSIS — N3281 Overactive bladder: Secondary | ICD-10-CM

## 2020-05-03 DIAGNOSIS — N5201 Erectile dysfunction due to arterial insufficiency: Secondary | ICD-10-CM

## 2020-05-03 DIAGNOSIS — C61 Malignant neoplasm of prostate: Secondary | ICD-10-CM | POA: Diagnosis not present

## 2020-05-03 DIAGNOSIS — R350 Frequency of micturition: Secondary | ICD-10-CM

## 2020-05-03 LAB — URINALYSIS, ROUTINE W REFLEX MICROSCOPIC
Bilirubin, UA: NEGATIVE
Glucose, UA: NEGATIVE
Ketones, UA: NEGATIVE
Leukocytes,UA: NEGATIVE
Nitrite, UA: NEGATIVE
Protein,UA: NEGATIVE
Specific Gravity, UA: 1.02 (ref 1.005–1.030)
Urobilinogen, Ur: 0.2 mg/dL (ref 0.2–1.0)
pH, UA: 6 (ref 5.0–7.5)

## 2020-05-03 LAB — MICROSCOPIC EXAMINATION
Bacteria, UA: NONE SEEN
Epithelial Cells (non renal): NONE SEEN /hpf (ref 0–10)
Renal Epithel, UA: NONE SEEN /hpf
WBC, UA: NONE SEEN /hpf (ref 0–5)

## 2020-05-03 MED ORDER — FINASTERIDE 5 MG PO TABS: 5.0000 mg | ORAL_TABLET | Freq: Every day | ORAL | 3 refills | Status: DC

## 2020-05-03 MED ORDER — TAMSULOSIN HCL 0.4 MG PO CAPS: 0.4000 mg | ORAL_CAPSULE | Freq: Every day | ORAL | 3 refills | Status: DC

## 2020-05-03 MED ORDER — TADALAFIL 20 MG PO TABS: 20.0000 mg | ORAL_TABLET | Freq: Every day | ORAL | 5 refills | Status: DC | PRN

## 2020-05-03 MED ORDER — MIRABEGRON ER 50 MG PO TB24: 50.0000 mg | ORAL_TABLET | Freq: Every day | ORAL | 3 refills | Status: DC

## 2020-05-03 NOTE — Progress Notes (Signed)
Urological Symptom Review  Patient is experiencing the following symptoms: Hard to postpone urination   Review of Systems  Gastrointestinal (upper)  : Negative for upper GI symptoms  Gastrointestinal (lower) : Negative for lower GI symptoms  Constitutional : Negative for symptoms  Skin: Negative for skin symptoms  Eyes: Negative for eye symptoms  Ear/Nose/Throat : Negative for Ear/Nose/Throat symptoms  Hematologic/Lymphatic: Negative for Hematologic/Lymphatic symptoms  Cardiovascular : Leg swelling  Respiratory : Negative for respiratory symptoms  Endocrine: Negative for endocrine symptoms  Musculoskeletal: Negative for musculoskeletal symptoms  Neurological: Negative for neurological symptoms  Psychologic: Depression

## 2020-05-03 NOTE — Patient Instructions (Signed)
Overactive Bladder, Adult  Overactive bladder is a condition in which a person has a sudden and frequent need to urinate. A person might also leak urine if he or she cannot get to the bathroom fast enough (urinary incontinence). Sometimes, symptoms can interfere with work or social activities. What are the causes? Overactive bladder is associated with poor nerve signals between your bladder and your brain. Your bladder may get the signal to empty before it is full. You may also have very sensitive muscles that make your bladder squeeze too soon. This condition may also be caused by other factors, such as:  Medical conditions: ? Urinary tract infection. ? Infection of nearby tissues. ? Prostate enlargement. ? Bladder stones, inflammation, or tumors. ? Diabetes. ? Muscle or nerve weakness, especially from these conditions:  A spinal cord injury.  Stroke.  Multiple sclerosis.  Parkinson's disease.  Other causes: ? Surgery on the uterus or urethra. ? Drinking too much caffeine or alcohol. ? Certain medicines, especially those that eliminate extra fluid in the body (diuretics). ? Constipation. What increases the risk? You may be at greater risk for overactive bladder if you:  Are an older adult.  Smoke.  Are going through menopause.  Have prostate problems.  Have a neurological disease, such as stroke, dementia, Parkinson's disease, or multiple sclerosis (MS).  Eat or drink alcohol, spicy food, caffeine, and other things that irritate the bladder.  Are overweight or obese. What are the signs or symptoms? Symptoms of this condition include a sudden, strong urge to urinate. Other symptoms include:  Leaking urine.  Urinating 8 or more times a day.  Waking up to urinate 2 or more times overnight. How is this diagnosed? This condition may be diagnosed based on:  Your symptoms and medical history.  A physical exam.  Blood or urine tests to check for possible causes,  such as infection. You may also need to see a health care provider who specializes in urinary tract problems. This is called a urologist. How is this treated? Treatment for overactive bladder depends on the cause of your condition and whether it is mild or severe. Treatment may include:  Bladder training, such as: ? Learning to control the urge to urinate by following a schedule to urinate at regular intervals. ? Doing Kegel exercises to strengthen the pelvic floor muscles that support your bladder.  Special devices, such as: ? Biofeedback. This uses sensors to help you become aware of your body's signals. ? Electrical stimulation. This uses electrodes placed inside the body (implanted) or outside the body. These electrodes send gentle pulses of electricity to strengthen the nerves or muscles that control the bladder. ? Women may use a plastic device, called a pessary, that fits into the vagina and supports the bladder.  Medicines, such as: ? Antibiotics to treat bladder infection. ? Antispasmodics to stop the bladder from releasing urine at the wrong time. ? Tricyclic antidepressants to relax bladder muscles. ? Injections of botulinum toxin type A directly into the bladder tissue to relax bladder muscles.  Surgery, such as: ? A device may be implanted to help manage the nerve signals that control urination. ? An electrode may be implanted to stimulate electrical signals in the bladder. ? A procedure may be done to change the shape of the bladder. This is done only in very severe cases. Follow these instructions at home: Eating and drinking  Make diet or lifestyle changes recommended by your health care provider. These may include: ? Drinking fluids   throughout the day and not only with meals. ? Cutting down on caffeine or alcohol. ? Eating a healthy and balanced diet to prevent constipation. This may include:  Choosing foods that are high in fiber, such as beans, whole grains, and  fresh fruits and vegetables.  Limiting foods that are high in fat and processed sugars, such as fried and sweet foods.   Lifestyle  Lose weight if needed.  Do not use any products that contain nicotine or tobacco. These include cigarettes, chewing tobacco, and vaping devices, such as e-cigarettes. If you need help quitting, ask your health care provider.   General instructions  Take over-the-counter and prescription medicines only as told by your health care provider.  If you were prescribed an antibiotic medicine, take it as told by your health care provider. Do not stop taking the antibiotic even if you start to feel better.  Use any implants or pessary as told by your health care provider.  If needed, wear pads to absorb urine leakage.  Keep a log to track how much and when you drink, and when you need to urinate. This will help your health care provider monitor your condition.  Keep all follow-up visits. This is important. Contact a health care provider if:  You have a fever or chills.  Your symptoms do not get better with treatment.  Your pain and discomfort get worse.  You have more frequent urges to urinate. Get help right away if:  You are not able to control your bladder. Summary  Overactive bladder refers to a condition in which a person has a sudden and frequent need to urinate.  Several conditions may lead to an overactive bladder.  Treatment for overactive bladder depends on the cause and severity of your condition.  Making lifestyle changes, doing Kegel exercises, keeping a log, and taking medicines can help with this condition. This information is not intended to replace advice given to you by your health care provider. Make sure you discuss any questions you have with your health care provider. Document Revised: 11/08/2019 Document Reviewed: 11/08/2019 Elsevier Patient Education  2021 Elsevier Inc.  

## 2020-05-03 NOTE — Progress Notes (Signed)
05/03/2020 11:29 AM   Paul Moon 1945/11/27 [id number]  Referring provider: Sonia Side., FNP [address],  [place] [address]  Followup OAB and Prostate cancer  HPI: Paul Moon is a 75yo here for followup for OAB, BPH, Prostate cancer, and Erectile dysfunction. His BPH is managed with 0.4mg  daily. He is also on mirabegron 50mg  daily for his urinary frequency and urgency. He has mild stable LUTS on both these medication. He uses tadalafil 20mg  prn which works well for his erectile dysfunction. PSa is 0.4 down from 0.6 6 months ago. No pther complaints today    PMH: Past Medical History:  Diagnosis Date  . Arthritis    INDEX FINGER JOINT LEFT HAND, right knee  . Bipolar disorder ([place])   . Depression   . History of gout 2018   right hand 2 fingers and right wrist-- 05-20-2017 per pt resolved  . Hyperplasia of prostate with lower urinary tract symptoms (LUTS)   . Hypertension   . OSA on CPAP    per study 03-22-2013  moderate OSA w/ AHI 26.8/hr  . Pinched cervical nerve root    PT STATES C 7-8 PINCHED NERVE CAUSING NUMBNESS MIDDLE, RING AND LITTLE FINGER LEFT HAND - NO PAIN - STATES HE IS TRYING TO AVOID SURGERY.  . Pre-diabetes    followed by pcp  . Prostate cancer [facility]) urologist-  dr Alyson Ingles  oncologist-  dr Tammi Klippel   dx 05-24-2016  Stage T1c, Gleason 4+3, PSA 5.49, vol 65.5cc/  pt seek second opinion's at [facility] and [facility] not candidate for focal laser ablation/  scheduled for radiactive seed implants 05-26-2017  . Wears glasses     Surgical History: Past Surgical History:  Procedure Laterality Date  . CIRCUMCISION  1973 approx.  Marland Kitchen KNEE ARTHROSCOPY Left 04/28/2013   Procedure: LEFT ARTHROSCOPY KNEE WITH DEBRIDEMENT meniscal debridement;  Surgeon: Gearlean Alf, MD;  Location: [facility];  Service: Orthopedics;  Laterality: Left;  . KNEE SURGERY Right 2008  . Paul GUIDED PROSTATE BIOPSY  12-12-2016    [facility], [place]   w/ general anesthesia   . PROSTATE BIOPSY  05-29-2016   dr Alyson Ingles office  . RADIOACTIVE SEED IMPLANT N/A 05/26/2017   Procedure: RADIOACTIVE SEED IMPLANT/BRACHYTHERAPY IMPLANT;  Surgeon: Cleon Gustin, MD;  Location: [facility];  Service: Urology;  Laterality: N/A;  . SPACE OAR INSTILLATION N/A 05/26/2017   Procedure: SPACE OAR INSTILLATION;  Surgeon: Cleon Gustin, MD;  Location: [facility];  Service: Urology;  Laterality: N/A;  . TONSILLECTOMY  1970s  . TOTAL KNEE ARTHROPLASTY N/A 08/23/2019   Procedure: TOTAL KNEE ARTHROPLASTY;  Surgeon: Gaynelle Arabian, MD;  [facility];  Service: Orthopedics;  Laterality: N/A;    Home Medications:  Allergies as of 05/03/2020      Reactions   Lisinopril Cough   Hctz [hydrochlorothiazide]    Unknown reaction   Lactose Intolerance (gi)    Quetiapine Fumarate Er Other (See Comments)   Risperdal [risperidone] Other (See Comments)   "not be lucid, be foggy"   Seroquel [quetiapine Fumarate] Other (See Comments)   Joint swelling   Seroquel [quetiapine] Other (See Comments)      Medication List       Accurate as of May 03, 2020 11:29 AM. If you have any questions, ask your nurse or doctor.        allopurinol 300 MG tablet Commonly known as: ZYLOPRIM Take 300 mg by mouth daily.  amLODipine 10 MG tablet Commonly known as: NORVASC Take 10 mg by mouth daily.   donepezil 10 MG tablet Commonly known as: ARICEPT Take 1 tablet (10 mg total) by mouth at bedtime. Future refills to be managed by PCP. What changed: additional instructions   finasteride 5 MG tablet Commonly known as: PROSCAR Take 1 tablet (5 mg total) by mouth daily.   losartan 100 MG tablet Commonly known as: COZAAR Take 100 mg by mouth at bedtime.   magnesium oxide 400 MG tablet Commonly known as: MAG-OX Take 400 mg by mouth daily.   memantine 10 MG tablet Commonly known as: NAMENDA TAKE 1 TABLET(10 MG) BY MOUTH TWICE DAILY   mirabegron ER 50 MG  Tb24 tablet Commonly known as: Myrbetriq Take 1 tablet (50 mg total) by mouth daily.   tadalafil 20 MG tablet Commonly known as: CIALIS Take 1 tablet (20 mg total) by mouth daily as needed for erectile dysfunction.   tamsulosin 0.4 MG Caps capsule Commonly known as: FLOMAX Take 1 capsule (0.4 mg total) by mouth daily.       Allergies:  Allergies  Allergen Reactions  . Lisinopril Cough  . Hctz [Hydrochlorothiazide]     Unknown reaction  . Lactose Intolerance (Gi)   . Quetiapine Fumarate Er Other (See Comments)  . Risperdal [Risperidone] Other (See Comments)    "not be lucid, be foggy"  . Seroquel [Quetiapine Fumarate] Other (See Comments)    Joint swelling  . Seroquel [Quetiapine] Other (See Comments)    Family History: Family History  Problem Relation Age of Onset  . Renal cancer Father   . Heart attack Paternal Grandmother   . Heart attack Paternal Grandfather   . Cancer Paternal Aunt        breast    Social History:  reports that he quit smoking about 34 years ago. His smoking use included cigarettes. He has a 30.00 pack-year smoking history. He has never used smokeless tobacco. He reports current alcohol use. He reports that he does not use drugs.  ROS: All other review of systems were reviewed and are negative except what is noted above in HPI  Physical Exam: BP (!) 147/64   Pulse 79   Temp 98.6 F (37 C)   Ht 5' 7.5" (1.715 m)   Wt 222 lb (100.7 kg)   BMI 34.26 kg/m   Constitutional:  Alert and oriented, No acute distress. HEENT: [place] AT, moist mucus membranes.  Trachea midline, no masses. Cardiovascular: No clubbing, cyanosis, or edema. Respiratory: Normal respiratory effort, no increased work of breathing. GI: Abdomen is soft, nontender, nondistended, no abdominal masses GU: No CVA tenderness.  Lymph: No cervical or inguinal lymphadenopathy. Skin: No rashes, bruises or suspicious lesions. Neurologic: Grossly intact, no focal deficits, moving all 4  extremities. Psychiatric: Normal mood and affect.  Laboratory Data: Lab Results  Component Value Date   WBC 8.1 08/24/2019   HGB 11.9 (L) 08/24/2019   HCT 35.6 (L) 08/24/2019   MCV 93.0 08/24/2019   PLT 248 08/24/2019    Lab Results  Component Value Date   CREATININE 1.22 08/24/2019    No results found for: PSA  No results found for: TESTOSTERONE  No results found for: HGBA1C  Urinalysis    Component Value Date/Time   APPEARANCEUR Clear 01/20/2020 [place] Negative 01/20/2020 [place] Negative 01/20/2020 0944   PROTEINUR Negative 01/20/2020 0944   NITRITE Negative 01/20/2020 0944   LEUKOCYTESUR Negative 01/20/2020 0944  Lab Results  Component Value Date   LABMICR Comment 01/20/2020   WBCUA None seen 11/04/2019   LABEPIT None seen 11/04/2019   MUCUS Present 11/04/2019   BACTERIA None seen 11/04/2019    Pertinent Imaging:  No results found for this or any previous visit.  No results found for this or any previous visit.  No results found for this or any previous visit.  No results found for this or any previous visit.  No results found for this or any previous visit.  No results found for this or any previous visit.  No results found for this or any previous visit.  No results found for this or any previous visit.   Assessment & Plan:    1. OAB (overactive bladder) -mirabegron 50mg  daily - Urinalysis, Routine w reflex microscopic  2. Prostate cancer ([place]) -RTC 6 months with PSA  3. Erectile dysfunction due to arterial insufficiency -Tadalafil 20mg  prn  4. BPH with LUTS, weak stream -Flomax 0.4mg  daily   Return in about 6 months (around 11/03/2020) for PSA.  [name], MD  [facility] [place]

## 2020-05-05 LAB — TESTOSTERONE,FREE AND TOTAL
Testosterone, Free: 6.3 pg/mL — ABNORMAL LOW (ref 6.6–18.1)
Testosterone: 473 ng/dL (ref 264–916)

## 2020-05-08 ENCOUNTER — Telehealth: Payer: Self-pay

## 2020-05-09 ENCOUNTER — Other Ambulatory Visit: Payer: Self-pay | Admitting: Adult Health

## 2020-05-09 DIAGNOSIS — R350 Frequency of micturition: Secondary | ICD-10-CM | POA: Insufficient documentation

## 2020-05-09 NOTE — Telephone Encounter (Signed)
They are normal

## 2020-05-11 ENCOUNTER — Telehealth: Payer: Self-pay

## 2020-05-11 NOTE — Telephone Encounter (Signed)
Patient left a voice mail.  Did not say why he needs a call back at [phone]. He wants Dr. Alyson Ingles to return his call.  Thanks, Helene Kelp

## 2020-05-11 NOTE — Telephone Encounter (Signed)
See prior task. 

## 2020-05-11 NOTE — Telephone Encounter (Signed)
Pt called back and was told his lab work was normal. I also said to pt that the lab was 6.3 and 6.6 is with in normal for the Testosterone. I had spoken with Dr. Alyson Ingles and he said other labs to do with the Testosterone were within normal limits so levels were ok. I told this to pt. He said no he still has problems that when he has intercourse it takes him forever to climax and he was wanting to speak with Dr. Alyson Ingles to do something about it. He feels it is his Testosterone.

## 2020-05-12 ENCOUNTER — Telehealth: Payer: Self-pay

## 2020-05-12 NOTE — Telephone Encounter (Signed)
Spoke with Dr. Alyson Ingles about pts question about Testosterone and he said he would call pt on Monday or Tuesday. Pt called and notified.

## 2020-06-12 ENCOUNTER — Telehealth: Payer: Self-pay

## 2020-06-12 NOTE — Telephone Encounter (Signed)
Returned call from patient wanting to make appointment due to scrotal pain. No answer and voicemail was full.

## 2020-07-03 ENCOUNTER — Other Ambulatory Visit: Payer: Self-pay | Admitting: Adult Health

## 2020-07-07 ENCOUNTER — Ambulatory Visit: Payer: Medicare Other | Admitting: Urology

## 2020-07-14 ENCOUNTER — Other Ambulatory Visit: Payer: Self-pay

## 2020-07-14 ENCOUNTER — Ambulatory Visit (INDEPENDENT_AMBULATORY_CARE_PROVIDER_SITE_OTHER): Payer: Medicare Other | Admitting: Urology

## 2020-07-14 ENCOUNTER — Encounter: Payer: Self-pay | Admitting: Urology

## 2020-07-14 VITALS — BP 136/77 | HR 82 | Temp 98.6°F | Resp 16 | Ht 67.5 in | Wt 228.0 lb

## 2020-07-14 DIAGNOSIS — C61 Malignant neoplasm of prostate: Secondary | ICD-10-CM

## 2020-07-14 DIAGNOSIS — N5201 Erectile dysfunction due to arterial insufficiency: Secondary | ICD-10-CM | POA: Diagnosis not present

## 2020-07-14 DIAGNOSIS — N3281 Overactive bladder: Secondary | ICD-10-CM | POA: Diagnosis not present

## 2020-07-14 LAB — URINALYSIS, ROUTINE W REFLEX MICROSCOPIC
Bilirubin, UA: NEGATIVE
Glucose, UA: NEGATIVE
Ketones, UA: NEGATIVE
Leukocytes,UA: NEGATIVE
Nitrite, UA: NEGATIVE
Protein,UA: NEGATIVE
Specific Gravity, UA: 1.025 (ref 1.005–1.030)
Urobilinogen, Ur: 0.2 mg/dL (ref 0.2–1.0)
pH, UA: 6 (ref 5.0–7.5)

## 2020-07-14 LAB — MICROSCOPIC EXAMINATION
Bacteria, UA: NONE SEEN
Epithelial Cells (non renal): NONE SEEN /hpf (ref 0–10)
Renal Epithel, UA: NONE SEEN /hpf
WBC, UA: NONE SEEN /hpf (ref 0–5)

## 2020-07-14 MED ORDER — VARDENAFIL HCL 20 MG PO TABS
20.0000 mg | ORAL_TABLET | ORAL | 5 refills | Status: DC | PRN
Start: 2020-07-14 — End: 2021-07-23

## 2020-07-14 MED ORDER — CYCLOBENZAPRINE HCL 5 MG PO TABS: 5.0000 mg | ORAL_TABLET | Freq: Three times a day (TID) | ORAL | 0 refills | Status: AC | PRN

## 2020-07-14 NOTE — Progress Notes (Signed)
07/14/2020 9:13 AM   Paul Moon September 06, 1945 [id number]  Referring provider: Sonia Side., [place],   [address]  Chief Complaint  Patient presents with  . Follow-up    HPI: [name] is a 75yo here for followup for OAB, BPH and Erectile dysfunction. He is currently on flomax 0.4mg  daily and mirabegron. He is having intermittent suprapubic pressure and right inguinal pain. It is worse and more noticable with activity. No other associated symptoms. IPSS 2 QOL 3. No worsening LUTS. He noted worsening issues with erectile dysfunction. Testosterone 450, 6.3% free. He has tried tadalafil 20mg  prn which is giving him a semifirm erection that he cannot maintain for penetration.    PMH: Past Medical History:  Diagnosis Date  . Arthritis    INDEX FINGER JOINT LEFT HAND, right knee  . Bipolar disorder (Paul Moon)   . Depression   . History of gout 2018   right hand 2 fingers and right wrist-- 05-20-2017 per pt resolved  . Hyperplasia of prostate with lower urinary tract symptoms (LUTS)   . Hypertension   . OSA on CPAP    per study 03-22-2013  moderate OSA w/ AHI 26.8/hr  . Pinched cervical nerve root    PT STATES C 7-8 PINCHED NERVE CAUSING NUMBNESS MIDDLE, RING AND LITTLE FINGER LEFT HAND - NO PAIN - STATES HE IS TRYING TO AVOID SURGERY.  . Pre-diabetes    followed by pcp  . Prostate cancer [facility]) urologist-  dr Alyson Ingles  oncologist-  dr Tammi Klippel   dx 05-24-2016  Stage T1c, Gleason 4+3, PSA 5.49, vol 65.5cc/  pt seek [facility] opinion[facility] not candidate for focal laser ablation/  scheduled for radiactive seed implants 05-26-2017  . Wears glasses     Surgical History: Past Surgical History:  Procedure Laterality Date  . CIRCUMCISION  1973 approx.  Marland Kitchen KNEE ARTHROSCOPY Left 04/28/2013   Procedure: LEFT ARTHROSCOPY KNEE WITH DEBRIDEMENT meniscal debridement;  Surgeon: Gearlean Alf, MD;  [facility];  Service: Orthopedics;  Laterality:  Left;  . KNEE SURGERY Right 2008  . MR GUIDED PROSTATE BIOPSY  12-12-2016    [facility], [place]   w/ general anesthesia  . PROSTATE BIOPSY  05-29-2016   dr Alyson Ingles office  . RADIOACTIVE SEED IMPLANT N/A 05/26/2017   Procedure: RADIOACTIVE SEED IMPLANT/BRACHYTHERAPY IMPLANT;  Surgeon: Cleon Gustin, MD;  Location: [facility];  Service: Urology;  Laterality: N/A;  . SPACE OAR INSTILLATION N/A 05/26/2017   Procedure: SPACE OAR INSTILLATION;  Surgeon: Cleon Gustin, MD;  Location: [facility];  Service: Urology;  Laterality: N/A;  . TONSILLECTOMY  1970s  . TOTAL KNEE ARTHROPLASTY N/A 08/23/2019   Procedure: TOTAL KNEE ARTHROPLASTY;  Surgeon: Gaynelle Arabian, MD;  [facility];  Service: Orthopedics;  Laterality: N/A;    Home Medications:  Allergies as of 07/14/2020      Reactions   Lisinopril Cough   Hctz [hydrochlorothiazide]    Unknown reaction   Lactose Intolerance (gi)    Quetiapine Fumarate Er Other (See Comments)   Risperdal [risperidone] Other (See Comments)   "not be lucid, be foggy"   Seroquel [quetiapine Fumarate] Other (See Comments)   Joint swelling   Seroquel [quetiapine] Other (See Comments)      Medication List       Accurate as of Jul 14, 2020  9:13 AM. If you have any questions, ask your nurse or doctor.  allopurinol 300 MG tablet Commonly known as: ZYLOPRIM Take 300 mg by mouth daily.   amLODipine 10 MG tablet Commonly known as: NORVASC Take 10 mg by mouth daily.   donepezil 10 MG tablet Commonly known as: ARICEPT Take 1 tablet (10 mg total) by mouth at bedtime. Future refills to be managed by PCP. What changed: additional instructions   finasteride 5 MG tablet Commonly known as: PROSCAR Take 1 tablet (5 mg total) by mouth daily.   losartan 100 MG tablet Commonly known as: COZAAR Take 100 mg by mouth at bedtime.   magnesium oxide 400 MG tablet Commonly known as: MAG-OX Take 400 mg by  mouth daily.   memantine 10 MG tablet Commonly known as: NAMENDA TAKE 1 TABLET(10 MG) BY MOUTH TWICE DAILY   mirabegron ER 50 MG Tb24 tablet Commonly known as: Myrbetriq Take 1 tablet (50 mg total) by mouth daily.   tadalafil 20 MG tablet Commonly known as: CIALIS Take 1 tablet (20 mg total) by mouth daily as needed for erectile dysfunction.   tamsulosin 0.4 MG Caps capsule Commonly known as: FLOMAX Take 1 capsule (0.4 mg total) by mouth daily.       Allergies:  Allergies  Allergen Reactions  . Lisinopril Cough  . Hctz [Hydrochlorothiazide]     Unknown reaction  . Lactose Intolerance (Gi)   . Quetiapine Fumarate Er Other (See Comments)  . Risperdal [Risperidone] Other (See Comments)    "not be lucid, be foggy"  . Seroquel [Quetiapine Fumarate] Other (See Comments)    Joint swelling  . Seroquel [Quetiapine] Other (See Comments)    Family History: Family History  Problem Relation Age of Onset  . Renal cancer Father   . Heart attack Paternal Grandmother   . Heart attack Paternal Grandfather   . Cancer Paternal Aunt        breast    Social History:  reports that he quit smoking about 35 years ago. His smoking use included cigarettes. He has a 30.00 pack-year smoking history. He has never used smokeless tobacco. He reports current alcohol use. He reports that he does not use drugs.  ROS: All other review of systems were reviewed and are negative except what is noted above in HPI  Physical Exam: BP 136/77   Pulse 82   Temp 98.6 F (37 C) (Oral)   Resp 16   Ht 5' 7.5" (1.715 m)   Wt 228 lb (103.4 kg)   BMI 35.18 kg/m   Constitutional:  Alert and oriented, No acute distress. HEENT: [place] AT, moist mucus membranes.  Trachea midline, no masses. Cardiovascular: No clubbing, cyanosis, or edema. Respiratory: Normal respiratory effort, no increased work of breathing. GI: Abdomen is soft, nontender, nondistended, no abdominal masses GU: No CVA tenderness.  Lymph: No  cervical or inguinal lymphadenopathy. Skin: No rashes, bruises or suspicious lesions. Neurologic: Grossly intact, no focal deficits, moving all 4 extremities. Psychiatric: Normal mood and affect.  Laboratory Data: Lab Results  Component Value Date   WBC 8.1 08/24/2019   HGB 11.9 (L) 08/24/2019   HCT 35.6 (L) 08/24/2019   MCV 93.0 08/24/2019   PLT 248 08/24/2019    Lab Results  Component Value Date   CREATININE 1.22 08/24/2019    No results found for: PSA  Lab Results  Component Value Date   TESTOSTERONE 473 05/03/2020    No results found for: HGBA1C  Urinalysis    Component Value Date/Time   APPEARANCEUR Clear 05/03/2020 1119   GLUCOSEU Negative 05/03/2020  [place] Negative 05/03/2020 1119   PROTEINUR Negative 05/03/2020 1119   NITRITE Negative 05/03/2020 1119   LEUKOCYTESUR Negative 05/03/2020 1119    Lab Results  Component Value Date   LABMICR See below: 05/03/2020   WBCUA None seen 05/03/2020   LABEPIT None seen 05/03/2020   MUCUS Present 11/04/2019   BACTERIA None seen 05/03/2020    Pertinent Imaging:  No results found for this or any previous visit.  No results found for this or any previous visit.  No results found for this or any previous visit.  No results found for this or any previous visit.  No results found for this or any previous visit.  No results found for this or any previous visit.  No results found for this or any previous visit.  No results found for this or any previous visit.   Assessment & Plan:    1. OAB (overactive bladder) -continue mirabegron 50mg   2. Prostate cancer ([facility]) -RTC 6 months with PSA  3. Erectile dysfunction due to arterial insufficiency -We will trial levitra 20mg  prn   No follow-ups on file.  [name], MD  [facility] [place]

## 2020-07-14 NOTE — Patient Instructions (Signed)

## 2020-07-14 NOTE — Progress Notes (Signed)
PVR=7 ml  Urological Symptom Review  Patient is experiencing the following symptoms: Urinary Frequency Urinary urgency Getting up at night to urinate    Review of Systems  Gastrointestinal (upper)  : Negative for upper GI symptoms  Gastrointestinal (lower) : Negative for lower GI symptoms  Constitutional : Negative for symptoms  Skin: Negative for skin symptoms  Eyes: Negative for eye symptoms  Ear/Nose/Throat : Negative for Ear/Nose/Throat symptoms  Hematologic/Lymphatic: Negative for Hematologic/Lymphatic symptoms  Cardiovascular : Negative for cardiovascular symptoms  Respiratory : Negative for respiratory symptoms  Endocrine: Negative for endocrine symptoms  Musculoskeletal: Negative for musculoskeletal symptoms  Neurological: Negative for neurological symptoms  Psychologic: Negative for psychiatric symptoms

## 2020-07-17 ENCOUNTER — Telehealth: Payer: Self-pay | Admitting: Adult Health

## 2020-07-17 MED ORDER — DONEPEZIL HCL 10 MG PO TABS: 10.0000 mg | ORAL_TABLET | Freq: Every day | ORAL | 0 refills | Status: AC

## 2020-07-17 NOTE — Telephone Encounter (Signed)
Pt called and LVM stating that someone had called him to discuss his memantine and donepezil. Please advise.

## 2020-07-17 NOTE — Telephone Encounter (Signed)
Called patient.  He states that primary care requested for [place] to see patient once a year for evaluation and  they would be happy to prescribe.  I confirmed with his pcp [facility]).    I called patient made appointment December 31, 2020 for annual appointment and we will see him once a year with refills.  I did refill for 1 week while he waits for his mail order that donepezil 10 mg p.o. nightly at [place].

## 2020-07-19 ENCOUNTER — Other Ambulatory Visit: Payer: Self-pay | Admitting: Adult Health

## 2020-09-27 ENCOUNTER — Other Ambulatory Visit: Payer: Self-pay | Admitting: General Surgery

## 2020-10-30 ENCOUNTER — Other Ambulatory Visit (HOSPITAL_BASED_OUTPATIENT_CLINIC_OR_DEPARTMENT_OTHER): Payer: Self-pay | Admitting: Family Medicine

## 2020-10-30 DIAGNOSIS — R1032 Left lower quadrant pain: Secondary | ICD-10-CM

## 2020-10-31 ENCOUNTER — Ambulatory Visit (HOSPITAL_BASED_OUTPATIENT_CLINIC_OR_DEPARTMENT_OTHER)
Admission: RE | Admit: 2020-10-31 | Discharge: 2020-10-31 | Disposition: A | Discharge status: Home / Self Care | Payer: Medicare Other | Source: Ambulatory Visit | Attending: Family Medicine | Admitting: Family Medicine

## 2020-10-31 ENCOUNTER — Other Ambulatory Visit: Payer: Self-pay

## 2020-10-31 DIAGNOSIS — R1032 Left lower quadrant pain: Secondary | ICD-10-CM | POA: Diagnosis present

## 2020-10-31 LAB — POCT I-STAT CREATININE: Creatinine, Ser: 1.7 mg/dL — ABNORMAL HIGH (ref 0.61–1.24)

## 2020-10-31 MED ORDER — IOHEXOL 350 MG/ML SOLN
75.0000 mL | Freq: Once | INTRAVENOUS | Status: AC | PRN
  Administered 2020-10-31: 75 mL via INTRAVENOUS

## 2020-11-01 ENCOUNTER — Other Ambulatory Visit: Payer: Medicare Other

## 2020-11-10 ENCOUNTER — Ambulatory Visit: Payer: Medicare Other | Admitting: Urology

## 2020-12-04 ENCOUNTER — Ambulatory Visit: Payer: Self-pay | Admitting: Adult Health

## 2020-12-12 ENCOUNTER — Telehealth: Payer: Self-pay | Admitting: Urology

## 2020-12-12 NOTE — Telephone Encounter (Signed)
Patient requesting samples of Myrbetriq 50mg . He is in the donut hole with his insurance and needs samples to get him through.  Placed a one month supply of Myrbetriq 50mg  samples up front for the patient to pick up.  Patient notified.

## 2020-12-27 ENCOUNTER — Encounter: Payer: Self-pay | Admitting: Urology

## 2020-12-27 ENCOUNTER — Other Ambulatory Visit: Payer: Self-pay

## 2020-12-27 ENCOUNTER — Ambulatory Visit: Payer: Medicare Other | Admitting: Urology

## 2020-12-27 VITALS — BP 168/78 | HR 89 | Temp 98.5°F | Wt 225.0 lb

## 2020-12-27 DIAGNOSIS — N5201 Erectile dysfunction due to arterial insufficiency: Secondary | ICD-10-CM

## 2020-12-27 MED ORDER — AMBULATORY NON FORMULARY MEDICATION: 0.2000 mL | 5 refills | Status: DC | PRN

## 2020-12-27 NOTE — Patient Instructions (Signed)
Erectile Dysfunction °Erectile dysfunction (ED) is the inability to get or keep an erection in order to have sexual intercourse. ED is considered a symptom of an underlying disorder and is not considered a disease. ED may include: °Inability to get an erection. °Lack of enough hardness of the erection to allow penetration. °Loss of erection before sex is finished. °What are the causes? °This condition may be caused by: °Physical causes, such as: °Artery problems. This may include heart disease, high blood pressure, atherosclerosis, and diabetes. °Hormonal problems, such as low testosterone. °Obesity. °Nerve problems. This may include back or pelvic injuries, multiple sclerosis, Parkinson's disease, spinal cord injury, and stroke. °Certain medicines, such as: °Pain relievers. °Antidepressants. °Blood pressure medicines and water pills (diuretics). °Cancer medicines. °Antihistamines. °Muscle relaxants. °Lifestyle factors, such as: °Use of drugs such as marijuana, cocaine, or opioids. °Excessive use of alcohol. °Smoking. °Lack of physical activity or exercise. °Psychological causes, such as: °Anxiety or stress. °Sadness or depression. °Exhaustion. °Fear about sexual performance. °Guilt. °What are the signs or symptoms? °Symptoms of this condition include: °Inability to get an erection. °Lack of enough hardness of the erection to allow penetration. °Loss of the erection before sex is finished. °Sometimes having normal erections, but with frequent unsatisfactory episodes. °Low sexual satisfaction in either partner due to erection problems. °A curved penis occurring with erection. The curve may cause pain, or the penis may be too curved to allow for intercourse. °Never having nighttime or morning erections. °How is this diagnosed? °This condition is often diagnosed by: °Performing a physical exam to find other diseases or specific problems with the penis. °Asking you detailed questions about the problem. °Doing tests,  such as: °Blood tests to check for diabetes mellitus or high cholesterol, or to measure hormone levels. °Other tests to check for underlying health conditions. °An ultrasound exam to check for scarring. °A test to check blood flow to the penis. °Doing a sleep study at home to measure nighttime erections. °How is this treated? °This condition may be treated by: °Medicines, such as: °Medicine taken by mouth to help you achieve an erection (oral medicine). °Hormone replacement therapy to replace low testosterone levels. °Medicine that is injected into the penis. Your health care provider may instruct you how to give yourself these injections at home. °Medicine that is delivered with a short applicator tube. The tube is inserted into the opening at the tip of the penis, which is the opening of the urethra. A tiny pellet of medicine is put in the urethra. The pellet dissolves and enhances erectile function. This is also called MUSE (medicated urethral system for erections) therapy. °Vacuum pump. This is a pump with a ring on it. The pump and ring are placed on the penis and used to create pressure that helps the penis become erect. °Penile implant surgery. In this procedure, you may receive: °An inflatable implant. This consists of cylinders, a pump, and a reservoir. The cylinders can be inflated with a fluid that helps to create an erection, and they can be deflated after intercourse. °A semi-rigid implant. This consists of two silicone rubber rods. The rods provide some rigidity. They are also flexible, so the penis can both curve downward in its normal position and become straight for sexual intercourse. °Blood vessel surgery to improve blood flow to the penis. During this procedure, a blood vessel from a different part of the body is placed into the penis to allow blood to flow around (bypass) damaged or blocked blood vessels. °Lifestyle changes,   such as exercising more, losing weight, and quitting smoking. °Follow  these instructions at home: °Medicines ° °Take over-the-counter and prescription medicines only as told by your health care provider. Do not increase the dosage without first discussing it with your health care provider. °If you are using self-injections, do injections as directed by your health care provider. Make sure you avoid any veins that are on the surface of the penis. After giving an injection, apply pressure to the injection site for 5 minutes. °Talk to your health care provider about how to prevent headaches while taking ED medicines. These medicines may cause a sudden headache due to the increase in blood flow in your body. °General instructions °Exercise regularly, as directed by your health care provider. Work with your health care provider to lose weight, if needed. °Do not use any products that contain nicotine or tobacco. These products include cigarettes, chewing tobacco, and vaping devices, such as e-cigarettes. If you need help quitting, ask your health care provider. °Before using a vacuum pump, read the instructions that come with the pump and discuss any questions with your health care provider. °Keep all follow-up visits. This is important. °Contact a health care provider if: °You feel nauseous. °You are vomiting. °You get sudden headaches while taking ED medicines. °You have any concerns about your sexual health. °Get help right away if: °You are taking oral or injectable medicines and you have an erection that lasts longer than 4 hours. If your health care provider is unavailable, go to the nearest emergency room for evaluation. An erection that lasts much longer than 4 hours can result in permanent damage to your penis. °You have severe pain in your groin or abdomen. °You develop redness or severe swelling of your penis. °You have redness spreading at your groin or lower abdomen. °You are unable to urinate. °You experience chest pain or a rapid heartbeat (palpitations) after taking oral  medicines. °These symptoms may represent a serious problem that is an emergency. Do not wait to see if the symptoms will go away. Get medical help right away. Call your local emergency services (911 in the [place] Do not drive yourself to the hospital. °Summary °Erectile dysfunction (ED) is the inability to get or keep an erection during sexual intercourse. °This condition is diagnosed based on a physical exam, your symptoms, and tests to determine the cause. Treatment varies depending on the cause and may include medicines, hormone therapy, surgery, or a vacuum pump. °You may need follow-up visits to make sure that you are using your medicines or devices correctly. °Get help right away if you are taking or injecting medicines and you have an erection that lasts longer than 4 hours. °This information is not intended to replace advice given to you by your health care provider. Make sure you discuss any questions you have with your health care provider. °Document Revised: 05/17/2020 Document Reviewed: 05/17/2020 °Elsevier Patient Education © 2022 [employer] ° °

## 2020-12-27 NOTE — Progress Notes (Signed)
12/27/2020 10:08 AM   Paul Moon 14-Mar-1945 [id number]  Referring provider: Sonia Side., FNP [address],  [place] [address]  Followup erectile dysfunction   HPI: [name] is a 75yo here for followup for erectile dysfunction. He has tried and viagra, cialis and levitra which has failed to improve his ability to maintain an erection. His erection is semifirm on these medications. He has a hx of brachytherapy. Good libido. No other complaints today   PMH: Past Medical History:  Diagnosis Date   Arthritis    INDEX FINGER JOINT LEFT HAND, right knee   Bipolar disorder ([place])    Depression    History of gout 2018   right hand 2 fingers and right wrist-- 05-20-2017 per pt resolved   Hyperplasia of prostate with lower urinary tract symptoms (LUTS)    Hypertension    OSA on CPAP    per study 03-22-2013  moderate OSA w/ AHI 26.8/hr   Pinched cervical nerve root    PT STATES C 7-8 PINCHED NERVE CAUSING NUMBNESS MIDDLE, RING AND LITTLE FINGER LEFT HAND - NO PAIN - STATES HE IS TRYING TO AVOID SURGERY.   Pre-diabetes    followed by pcp   Prostate cancer [facility]) urologist-  dr Alyson Ingles  oncologist-  dr Tammi Klippel   dx 05-24-2016  Stage T1c, Gleason 4+3, PSA 5.49, vol 65.5cc/  pt seek [facility] not candidate for focal laser ablation/  scheduled for radiactive seed implants 05-26-2017   Wears glasses     Surgical History: Past Surgical History:  Procedure Laterality Date   CIRCUMCISION  1973 approx.   KNEE ARTHROSCOPY Left 04/28/2013   Procedure: LEFT ARTHROSCOPY KNEE WITH DEBRIDEMENT meniscal debridement;  Surgeon: Gearlean Alf, MD;  Location: [facility];  Service: Orthopedics;  Laterality: Left;   KNEE SURGERY Right 2008   MR GUIDED PROSTATE BIOPSY  12-12-2016    [facility] [place], [place])   w/ general anesthesia   PROSTATE BIOPSY  05-29-2016   dr Alyson Ingles office   RADIOACTIVE SEED IMPLANT N/A 05/26/2017   Procedure: RADIOACTIVE SEED  IMPLANT/BRACHYTHERAPY IMPLANT;  Surgeon: Cleon Gustin, MD;  Location: [facility];  Service: Urology;  Laterality: N/A;   SPACE OAR INSTILLATION N/A 05/26/2017   Procedure: SPACE OAR INSTILLATION;  Surgeon: Cleon Gustin, MD;  Location: [facility];  Service: Urology;  Laterality: N/A;   TONSILLECTOMY  1970s   TOTAL KNEE ARTHROPLASTY N/A 08/23/2019   Procedure: TOTAL KNEE ARTHROPLASTY;  Surgeon: Gaynelle Arabian, MD;  Location: [facility];  Service: Orthopedics;  Laterality: N/A;    Home Medications:  Allergies as of 12/27/2020       Reactions   Lisinopril Cough   Hctz [hydrochlorothiazide]    Unknown reaction   Lactose Intolerance (gi)    Quetiapine Fumarate Er Other (See Comments)   Risperdal [risperidone] Other (See Comments)   "not be lucid, be foggy"   Seroquel [quetiapine Fumarate] Other (See Comments)   Joint swelling   Seroquel [quetiapine] Other (See Comments)        Medication List        Accurate as of December 27, 2020 10:08 AM. If you have any questions, ask your nurse or doctor.          allopurinol 300 MG tablet Commonly known as: ZYLOPRIM Take 300 mg by mouth daily.   amLODipine 10 MG tablet Commonly known as: NORVASC Take 10 mg by mouth daily.  cyclobenzaprine 5 MG tablet Commonly known as: FLEXERIL Take 1 tablet (5 mg total) by mouth 3 (three) times daily as needed for muscle spasms.   donepezil 10 MG tablet Commonly known as: ARICEPT Take 1 tablet (10 mg total) by mouth at bedtime.   finasteride 5 MG tablet Commonly known as: PROSCAR Take 1 tablet (5 mg total) by mouth daily.   losartan 100 MG tablet Commonly known as: COZAAR Take 100 mg by mouth at bedtime.   magnesium oxide 400 MG tablet Commonly known as: MAG-OX Take 400 mg by mouth daily.   memantine 10 MG tablet Commonly known as: NAMENDA Take 1 tablet (10 mg total) by mouth 2 (two) times daily. Must keep follow up 12/04/2020 for ongoing  refills   mirabegron ER 50 MG Tb24 tablet Commonly known as: Myrbetriq Take 1 tablet (50 mg total) by mouth daily.   tadalafil 20 MG tablet Commonly known as: CIALIS Take 1 tablet (20 mg total) by mouth daily as needed for erectile dysfunction.   tamsulosin 0.4 MG Caps capsule Commonly known as: FLOMAX Take 1 capsule (0.4 mg total) by mouth daily.   vardenafil 20 MG tablet Commonly known as: LEVITRA Take 1 tablet (20 mg total) by mouth as needed for erectile dysfunction.        Allergies:  Allergies  Allergen Reactions   Lisinopril Cough   Hctz [Hydrochlorothiazide]     Unknown reaction   Lactose Intolerance (Gi)    Quetiapine Fumarate Er Other (See Comments)   Risperdal [Risperidone] Other (See Comments)    "not be lucid, be foggy"   Seroquel [Quetiapine Fumarate] Other (See Comments)    Joint swelling   Seroquel [Quetiapine] Other (See Comments)    Family History: Family History  Problem Relation Age of Onset   Renal cancer Father    Heart attack Paternal Grandmother    Heart attack Paternal Grandfather    Cancer Paternal Aunt        breast    Social History:  reports that he quit smoking about 35 years ago. His smoking use included cigarettes. He has a 30.00 pack-year smoking history. He has never used smokeless tobacco. He reports current alcohol use. He reports that he does not use drugs.  ROS: All other review of systems were reviewed and are negative except what is noted above in HPI  Physical Exam: BP (!) 168/78   Pulse 89   Temp 98.5 F (36.9 C)   Wt 225 lb (102.1 kg)   BMI 34.72 kg/m   Constitutional:  Alert and oriented, No acute distress. HEENT: [place] AT, moist mucus membranes.  Trachea midline, no masses. Cardiovascular: No clubbing, cyanosis, or edema. Respiratory: Normal respiratory effort, no increased work of breathing. GI: Abdomen is soft, nontender, nondistended, no abdominal masses GU: No CVA tenderness.  Lymph: No cervical or  inguinal lymphadenopathy. Skin: No rashes, bruises or suspicious lesions. Neurologic: Grossly intact, no focal deficits, moving all 4 extremities. Psychiatric: Normal mood and affect.  Laboratory Data: Lab Results  Component Value Date   WBC 8.1 08/24/2019   HGB 11.9 (L) 08/24/2019   HCT 35.6 (L) 08/24/2019   MCV 93.0 08/24/2019   PLT 248 08/24/2019    Lab Results  Component Value Date   CREATININE 1.70 (H) 10/31/2020    No results found for: PSA  Lab Results  Component Value Date   TESTOSTERONE 473 05/03/2020    No results found for: HGBA1C  Urinalysis    Component Value Date/Time  APPEARANCEUR Clear 07/14/2020 0927   GLUCOSEU Negative 07/14/2020 0927   BILIRUBINUR Negative 07/14/2020 0927   PROTEINUR Negative 07/14/2020 0927   NITRITE Negative 07/14/2020 0927   LEUKOCYTESUR Negative 07/14/2020 0927    Lab Results  Component Value Date   LABMICR See below: 07/14/2020   WBCUA None seen 07/14/2020   LABEPIT None seen 07/14/2020   MUCUS Present 07/14/2020   BACTERIA None seen 07/14/2020    Pertinent Imaging:  No results found for this or any previous visit.  No results found for this or any previous visit.  No results found for this or any previous visit.  No results found for this or any previous visit.  No results found for this or any previous visit.  No results found for this or any previous visit.  No results found for this or any previous visit.  No results found for this or any previous visit.   Assessment & Plan:    1. Erectile dysfunction due to arterial insufficiency -We will trail trimix injections   No follow-ups on file.  [name], MD  [facility] [place]

## 2021-01-08 ENCOUNTER — Other Ambulatory Visit: Payer: Medicare Other

## 2021-01-08 ENCOUNTER — Other Ambulatory Visit: Payer: Self-pay

## 2021-01-08 DIAGNOSIS — C61 Malignant neoplasm of prostate: Secondary | ICD-10-CM

## 2021-01-08 DIAGNOSIS — N5201 Erectile dysfunction due to arterial insufficiency: Secondary | ICD-10-CM

## 2021-01-08 MED ORDER — AMBULATORY NON FORMULARY MEDICATION: 0.2000 mL | 5 refills | Status: DC | PRN

## 2021-01-08 NOTE — Progress Notes (Signed)
Rx faxed to [facility]

## 2021-01-09 LAB — PSA: Prostate Specific Ag, Serum: 0.3 ng/mL (ref 0.0–4.0)

## 2021-01-10 ENCOUNTER — Ambulatory Visit: Payer: Medicare Other | Admitting: Urology

## 2021-01-15 ENCOUNTER — Encounter: Payer: Self-pay | Admitting: Urology

## 2021-01-15 ENCOUNTER — Ambulatory Visit: Payer: Medicare Other | Admitting: Urology

## 2021-01-15 ENCOUNTER — Other Ambulatory Visit: Payer: Self-pay

## 2021-01-15 VITALS — BP 151/72 | HR 80 | Temp 98.1°F

## 2021-01-15 DIAGNOSIS — N5201 Erectile dysfunction due to arterial insufficiency: Secondary | ICD-10-CM

## 2021-01-15 DIAGNOSIS — C61 Malignant neoplasm of prostate: Secondary | ICD-10-CM

## 2021-01-15 NOTE — Progress Notes (Signed)
01/15/2021 8:56 AM   [name] May 18, 1945 [id number]  Referring provider: Sonia Side., FNP [address],  [place] [address]  Followup Prostate cancer and erectile dysfunction   HPI: [name] is a 75yo here for followup for for prostate cancer and erectile dysfunction. PSA decreased to 0.3. No worsening LUTS. He has failed multiple PDE5s in the past. He is here for trimix teaching.    PMH: Past Medical History:  Diagnosis Date   Arthritis    INDEX FINGER JOINT LEFT HAND, right knee   Bipolar disorder ([place])    Depression    History of gout 2018   right hand 2 fingers and right wrist-- 05-20-2017 per pt resolved   Hyperplasia of prostate with lower urinary tract symptoms (LUTS)    Hypertension    OSA on CPAP    per study 03-22-2013  moderate OSA w/ AHI 26.8/hr   Pinched cervical nerve root    PT STATES C 7-8 PINCHED NERVE CAUSING NUMBNESS MIDDLE, RING AND LITTLE FINGER LEFT HAND - NO PAIN - STATES HE IS TRYING TO AVOID SURGERY.   Pre-diabetes    followed by pcp   Prostate cancer [facility]) urologist-  dr Alyson Ingles  oncologist-  dr Tammi Klippel   dx 05-24-2016  Stage T1c, Gleason 4+3, PSA 5.49, vol 65.5cc/  pt seek second opinion's at [facility] and [facility] not candidate for focal laser ablation/  scheduled for radiactive seed implants 05-26-2017   Wears glasses     Surgical History: Past Surgical History:  Procedure Laterality Date   CIRCUMCISION  1973 approx.   KNEE ARTHROSCOPY Left 04/28/2013   Procedure: LEFT ARTHROSCOPY KNEE WITH DEBRIDEMENT meniscal debridement;  Surgeon: Gearlean Alf, MD;  [facility];  Service: Orthopedics;  Laterality: Left;   KNEE SURGERY Right 2008   MR GUIDED PROSTATE BIOPSY  12-12-2016    [facility] [place], [place])   w/ general anesthesia   PROSTATE BIOPSY  05-29-2016   dr Alyson Ingles office   RADIOACTIVE SEED IMPLANT N/A 05/26/2017   Procedure: RADIOACTIVE SEED IMPLANT/BRACHYTHERAPY IMPLANT;  Surgeon: Cleon Gustin, MD;   Location: [facility];  Service: Urology;  Laterality: N/A;   SPACE OAR INSTILLATION N/A 05/26/2017   Procedure: SPACE OAR INSTILLATION;  Surgeon: Cleon Gustin, MD;  Location: [facility];  Service: Urology;  Laterality: N/A;   TONSILLECTOMY  1970s   TOTAL KNEE ARTHROPLASTY N/A 08/23/2019   Procedure: TOTAL KNEE ARTHROPLASTY;  Surgeon: Gaynelle Arabian, MD;  [facility];  Service: Orthopedics;  Laterality: N/A;    Home Medications:  Allergies as of 01/15/2021       Reactions   Lisinopril Cough   Hctz [hydrochlorothiazide]    Unknown reaction   Lactose Intolerance (gi)    Quetiapine Fumarate Er Other (See Comments)   Risperdal [risperidone] Other (See Comments)   "not be lucid, be foggy"   Seroquel [quetiapine Fumarate] Other (See Comments)   Joint swelling   Seroquel [quetiapine] Other (See Comments)        Medication List        Accurate as of January 15, 2021  8:56 AM. If you have any questions, ask your nurse or doctor.          allopurinol 300 MG tablet Commonly known as: ZYLOPRIM Take 300 mg by mouth daily.   AMBULATORY NON FORMULARY MEDICATION 0.2 mLs by Intracavernosal route as needed. Medication Name: Trimix  PGE 81mcg Pap 30mg  Phent 1mg   amLODipine 10 MG tablet Commonly known as: NORVASC Take 10 mg by mouth daily.   cyclobenzaprine 5 MG tablet Commonly known as: FLEXERIL Take 1 tablet (5 mg total) by mouth 3 (three) times daily as needed for muscle spasms.   donepezil 10 MG tablet Commonly known as: ARICEPT Take 1 tablet (10 mg total) by mouth at bedtime.   finasteride 5 MG tablet Commonly known as: PROSCAR Take 1 tablet (5 mg total) by mouth daily.   losartan 100 MG tablet Commonly known as: COZAAR Take 100 mg by mouth at bedtime.   magnesium oxide 400 MG tablet Commonly known as: MAG-OX Take 400 mg by mouth daily.   memantine 10 MG tablet Commonly known as: NAMENDA Take 1 tablet (10 mg total)  by mouth 2 (two) times daily. Must keep follow up 12/04/2020 for ongoing refills   mirabegron ER 50 MG Tb24 tablet Commonly known as: Myrbetriq Take 1 tablet (50 mg total) by mouth daily.   tadalafil 20 MG tablet Commonly known as: CIALIS Take 1 tablet (20 mg total) by mouth daily as needed for erectile dysfunction.   tamsulosin 0.4 MG Caps capsule Commonly known as: FLOMAX Take 1 capsule (0.4 mg total) by mouth daily.   vardenafil 20 MG tablet Commonly known as: LEVITRA Take 1 tablet (20 mg total) by mouth as needed for erectile dysfunction.        Allergies:  Allergies  Allergen Reactions   Lisinopril Cough   Hctz [Hydrochlorothiazide]     Unknown reaction   Lactose Intolerance (Gi)    Quetiapine Fumarate Er Other (See Comments)   Risperdal [Risperidone] Other (See Comments)    "not be lucid, be foggy"   Seroquel [Quetiapine Fumarate] Other (See Comments)    Joint swelling   Seroquel [Quetiapine] Other (See Comments)    Family History: Family History  Problem Relation Age of Onset   Renal cancer Father    Heart attack Paternal Grandmother    Heart attack Paternal Grandfather    Cancer Paternal Aunt        breast    Social History:  reports that he quit smoking about 35 years ago. His smoking use included cigarettes. He has a 30.00 pack-year smoking history. He has never used smokeless tobacco. He reports current alcohol use. He reports that he does not use drugs.  ROS: All other review of systems were reviewed and are negative except what is noted above in HPI  Physical Exam: BP (!) 151/72   Pulse 80   Temp 98.1 F (36.7 C)   Constitutional:  Alert and oriented, No acute distress. HEENT: [place] AT, moist mucus membranes.  Trachea midline, no masses. Cardiovascular: No clubbing, cyanosis, or edema. Respiratory: Normal respiratory effort, no increased work of breathing. GI: Abdomen is soft, nontender, nondistended, no abdominal masses GU: No CVA tenderness.   Lymph: No cervical or inguinal lymphadenopathy. Skin: No rashes, bruises or suspicious lesions. Neurologic: Grossly intact, no focal deficits, moving all 4 extremities. Psychiatric: Normal mood and affect.  Laboratory Data: Lab Results  Component Value Date   WBC 8.1 08/24/2019   HGB 11.9 (L) 08/24/2019   HCT 35.6 (L) 08/24/2019   MCV 93.0 08/24/2019   PLT 248 08/24/2019    Lab Results  Component Value Date   CREATININE 1.70 (H) 10/31/2020    No results found for: PSA  Lab Results  Component Value Date   TESTOSTERONE 473 05/03/2020    No results found for: HGBA1C  Urinalysis  Component Value Date/Time   APPEARANCEUR Clear 07/14/2020 0927   GLUCOSEU Negative 07/14/2020 0927   BILIRUBINUR Negative 07/14/2020 0927   PROTEINUR Negative 07/14/2020 0927   NITRITE Negative 07/14/2020 0927   LEUKOCYTESUR Negative 07/14/2020 0927    Lab Results  Component Value Date   LABMICR See below: 07/14/2020   WBCUA None seen 07/14/2020   LABEPIT None seen 07/14/2020   MUCUS Present 07/14/2020   BACTERIA None seen 07/14/2020    Pertinent Imaging:  No results found for this or any previous visit.  No results found for this or any previous visit.  No results found for this or any previous visit.  No results found for this or any previous visit.  No results found for this or any previous visit.  No results found for this or any previous visit.  No results found for this or any previous visit.  No results found for this or any previous visit.  Trimix injection instruction:  Patient instructed to draw 0.75ml of trimix into the insulin syringe. I them instructed him to inject at the mid penile shaft at either the 3 or 9 o'clock position. I then injected the patient and he achieved a good erection in 20 minutes. Penile injection completed.   Assessment & Plan:    1. Erectile dysfunction due to arterial insufficiency The patient was instructed on proper technique for  trimix injection. He is instructed to alternate side and location of the injection. He was instructed in titrating the trimix dose. He is instructed to call the office for an erection lasting more than 4 hours   2. Prostate cancer ([place]) RTC 6 months with PSA   No follow-ups on file.  [name], MD  [facility] [place]

## 2021-01-15 NOTE — Patient Instructions (Signed)
Prostate Cancer °The prostate is a small gland that helps make semen. It is located below a man's bladder, in front of the rectum. Prostate cancer is when abnormal cells grow in this gland. °What are the causes? °The cause of this condition is not known. °What increases the risk? °Being age 75 or older. °Having a family history of prostate cancer. °Having a family history of cancer of the breasts or ovaries. °Having genes that are passed from parent to child (inherited). °Having Lynch syndrome. °African American men and men of African descent are diagnosed with prostate cancer at higher rates than other men. °What are the signs or symptoms? °Problems peeing (urinating). This may include: °A stream that is weak, or pee that stops and starts. °Trouble starting or stopping your pee. °Trouble emptying all of your pee. °Needing to pee more often, especially at night. °Blood in your pee or semen. °Pain in the: °Lower back. °Lower belly (abdomen). °Hips. °Trouble getting an erection. °Weakness or numbness in the legs or feet. °How is this treated? °Treatment for this condition depends on: °How much the cancer has spread. °Your age. °The kind of treatment you want. °Your health. °Treatments include: °Being watched. This is called observation. You will be tested from time to time, but you will not get treated. Tests are to make sure that the cancer is not growing. °Surgery. This may be done to: °Take out (remove) the prostate. °Freeze and kill cancer cells. °Radiation. This uses a strong beam of energy to kill cancer cells. °Chemotherapy. This uses medicines that stop cancer cells from increasing. This kills cancer cells and healthy cells. °Targeted therapy. This kills cancer cells only. Healthy cells are not affected. °Hormone treatment. This stops the body from making hormones that help the cancer cells grow. °Follow these instructions at home: °Lifestyle °Do not smoke or use any products that contain nicotine or tobacco.  If you need help quitting, ask your doctor. °Eat a healthy diet. °Treatment may affect your ability to have sex. If you have a partner, touch, hold, hug, and caress your partner to have intimate moments. °Get plenty of sleep. °Ask your doctor for help to find a support group for men with prostate cancer. °General instructions °Take over-the-counter and prescription medicines only as told by your doctor. °If you have to go to the hospital, let your cancer doctor (oncologist) know. °Keep all follow-up visits. °Where to find more information °American Cancer Society: www.cancer.org °American Society of Clinical Oncology: www.cancer.net °National Cancer Institute: www.cancer.gov °Contact a doctor if: °You have new or more trouble peeing. °You have new or more blood in your pee. °You have new or more pain in your hips, back, or chest. °Get help right away if: °You have weakness in your legs. °You lose feeling in your legs. °You cannot control your pee or your poop (stool). °You have chills or a fever. °Summary °The prostate is a male gland that helps make semen. °Prostate cancer is when abnormal cells grow in this gland. °Treatment includes doing surgery, using medicines, using strong beams of energy, or watching without treatment. °Ask your doctor for help to find a support group for men with prostate cancer. °Contact a doctor if you have problems peeing or have any new pain that you did not have before. °This information is not intended to replace advice given to you by your health care provider. Make sure you discuss any questions you have with your health care provider. °Document Revised: 05/17/2020 Document Reviewed: 05/17/2020 °Elsevier   Patient Education © 2022 Elsevier Inc. ° °

## 2021-01-15 NOTE — Progress Notes (Signed)
Urological Symptom Review  Patient is experiencing the following symptoms: Frequent urination Hard to postpone urination Get up at night to urinate Erection problems (male only)   Review of Systems  Gastrointestinal (upper)  : Negative for upper GI symptoms  Gastrointestinal (lower) : Negative for lower GI symptoms  Constitutional : Negative for symptoms  Skin: Negative for skin symptoms  Eyes: Negative for eye symptoms  Ear/Nose/Throat : Negative for Ear/Nose/Throat symptoms  Hematologic/Lymphatic: Negative for Hematologic/Lymphatic symptoms  Cardiovascular : Leg swelling  Respiratory : Negative for respiratory symptoms  Endocrine: Negative for endocrine symptoms  Musculoskeletal: Negative for musculoskeletal symptoms  Neurological: Negative for neurological symptoms  Psychologic: Negative for psychiatric symptoms

## 2021-01-23 ENCOUNTER — Telehealth: Payer: Self-pay

## 2021-01-23 NOTE — Telephone Encounter (Signed)
Patient called with no answer. Message left stating a month worth of samples can be given and if he would like they can be left at front office. Requested call back to office.

## 2021-01-23 NOTE — Telephone Encounter (Signed)
Pt left a Voice Mail:  Wanting to know if there are any free samples of mirabegron ER (MYRBETRIQ) 50 MG TB24 tablet  he can have for the rest of the year?  Pt's insurance is in a donut hole until then.  Please advise.  Call back: [phone] Jerilynn Mages)   Thanks, Helene Kelp

## 2021-02-09 ENCOUNTER — Other Ambulatory Visit: Payer: Self-pay

## 2021-02-09 DIAGNOSIS — N3281 Overactive bladder: Secondary | ICD-10-CM

## 2021-02-09 MED ORDER — MIRABEGRON ER 50 MG PO TB24: 50.0000 mg | ORAL_TABLET | Freq: Every day | ORAL | 3 refills | Status: DC

## 2021-02-14 ENCOUNTER — Other Ambulatory Visit: Payer: Self-pay

## 2021-02-14 ENCOUNTER — Ambulatory Visit: Payer: Medicare Other | Admitting: Physician Assistant

## 2021-02-14 VITALS — BP 145/76 | HR 77 | Temp 98.2°F | Resp 18 | Ht 67.5 in | Wt 225.0 lb

## 2021-02-14 DIAGNOSIS — F3289 Other specified depressive episodes: Secondary | ICD-10-CM

## 2021-02-14 DIAGNOSIS — F32A Depression, unspecified: Secondary | ICD-10-CM | POA: Insufficient documentation

## 2021-02-14 NOTE — Patient Instructions (Signed)
I do encourage you to work on maintaining a good daily schedule, using the phrase "can I just" to motivate yourself to get out of bed and start your day.  Or to motivate yourself to leave the house and perform your daily activities.  Please let us know if there is anything else we can do for you  Kennieth Rad, PA-C Physician Assistant [place] [email or web address]  Managing Depression, Adult Depression is a mental health condition that affects your thoughts, feelings, and actions. Being diagnosed with depression can bring you relief if you did not know why you have felt or behaved a certain way. It could also leave you feeling overwhelmed with uncertainty about your future. Preparing yourself to manage your symptoms can help you feel more positive about your future. How to manage lifestyle changes Managing stress Stress is your body's reaction to life changes and events, both good and bad. Stress can add to your feelings of depression. Learning to manage your stress can help lessen your feelings of depression. Try some of the following approaches to reducing your stress (stress reduction techniques): Listen to music that you enjoy and that inspires you. Try using a meditation app or take a meditation class. Develop a practice that helps you connect with your spiritual self. Walk in nature, pray, or go to a place of worship. Do some deep breathing. To do this, inhale slowly through your nose. Pause at the top of your inhale for a few seconds and then exhale slowly, letting your muscles relax. Practice yoga to help relax and work your muscles. Choose a stress reduction technique that suits your lifestyle and personality. These techniques take time and practice to develop. Set aside 5-15 minutes a day to do them. Therapists can offer training in these techniques. Other things you can do to manage stress include: Keeping a stress  diary. Knowing your limits and saying no when you think something is too much. Paying attention to how you react to certain situations. You may not be able to control everything, but you can change your reaction. Adding humor to your life by watching funny films or TV shows. Making time for activities that you enjoy and that relax you.  Medicines Medicines, such as antidepressants, are often a part of treatment for depression. Talk with your pharmacist or health care provider about all the medicines, supplements, and herbal products that you take, their possible side effects, and what medicines and other products are safe to take together. Make sure to report any side effects you may have to your health care provider. Relationships Your health care provider may suggest family therapy, couples therapy, or individual therapy as part of your treatment. How to recognize changes Everyone responds differently to treatment for depression. As you recover from depression, you may start to: Have more interest in doing activities. Feel less hopeless. Have more energy. Overeat less often, or have a better appetite. Have better mental focus. It is important to recognize if your depression is not getting better or is getting worse. The symptoms you had in the beginning may return, such as: Tiredness (fatigue) or low energy. Eating too much or too little. Sleeping too much or too little. Feeling restless, agitated, or hopeless. Trouble focusing or making decisions. Unexplained physical complaints. Feeling irritable, angry, or aggressive. If you or your family members notice these symptoms coming back, let your health care provider know right away. Follow these instructions at home: Activity  Try to get some form  of exercise each day, such as walking, biking, swimming, or lifting weights. Practice stress reduction techniques. Engage your mind by taking a class or doing some volunteer  work. Lifestyle Get the right amount and quality of sleep. Cut down on using caffeine, tobacco, alcohol, and other potentially harmful substances. Eat a healthy diet that includes plenty of vegetables, fruits, whole grains, low-fat dairy products, and lean protein. Do not eat a lot of foods that are high in solid fats, added sugars, or salt (sodium). General instructions Take over-the-counter and prescription medicines only as told by your health care provider. Keep all follow-up visits as told by your health care provider. This is important. Where to find support Talking to others Friends and family members can be sources of support and guidance. Talk to trusted friends or family members about your condition. Explain your symptoms to them, and let them know that you are working with a health care provider to treat your depression. Tell friends and family members how they also can be helpful. Finances Find appropriate mental health providers that fit with your financial situation. Talk with your health care provider about options to get reduced prices on your medicines. Where to find more information You can find support in your area from: Anxiety and Depression Association of America (ADAA): www.adaa.org Mental Health America: www.mentalhealthamerica.net [employer] on Mental Illness: www.nami.org Contact a health care provider if: You stop taking your antidepressant medicines, and you have any of these symptoms: Nausea. Headache. Light-headedness. Chills and body aches. Not being able to sleep (insomnia). You or your friends and family think your depression is getting worse. Get help right away if: You have thoughts of hurting yourself or others. If you ever feel like you may hurt yourself or others, or have thoughts about taking your own life, get help right away. Go to your nearest emergency department or: Call your local emergency services (911 in the [place] Call a suicide  crisis helpline, such as the [facility] at 225-279-0198 or 988 in the [place]. This is open 24 hours a day in the [place] Text the Crisis Text Line at (220)076-8134 (in the [place].). Summary If you are diagnosed with depression, preparing yourself to manage your symptoms is a good way to feel positive about your future. Work with your health care provider on a management plan that includes stress reduction techniques, medicines (if applicable), therapy, and healthy lifestyle habits. Keep talking with your health care provider about how your treatment is working. If you have thoughts about taking your own life, call a suicide crisis helpline or text a crisis text line. This information is not intended to replace advice given to you by your health care provider. Make sure you discuss any questions you have with your health care provider. Document Revised: 09/13/2020 Document Reviewed: 12/30/2018 Elsevier Patient Education  2022 [employer].

## 2021-02-14 NOTE — Progress Notes (Signed)
New Patient Office Visit  Subjective:  Patient ID: Paul Moon, male    DOB: 1945/11/17  Age: 75 y.o. MRN: [id number]  CC:  Chief Complaint  Patient presents with   Fatigue     HPI Paul Moon states that he wanted to be seen because he felt his energy levels were low, states last week he stayed at home on Tuesday Wednesday and Thursday, states that he over ate, stayed in bed most of the time.  States that this is unusual for him.  States that he was filling out his paperwork to be seen today, he realized he may be having a depressed mood.  States that he has previously been diagnosed with depression but does not take medication for it.  States that he has been going to cognitive behavioral therapy, was going twice a month, has recently started attending once a month.  Reports his normal sleep pattern is sleeping 5 to 6 hours, getting up to use the bathroom and then sometimes being able to go right back to sleep and occasionally having difficulty falling back asleep.  Does not use any medication to help with sleep.  States that he recently started working on improving his diet, does look forward to spending time with his grandson.  States that he currently is living with his brother, does also endorse that he takes vitamin D on a daily basis.  Adamantly denies any thoughts of self-harm. Past Medical History:  Diagnosis Date   Arthritis    INDEX FINGER JOINT LEFT HAND, right knee   Bipolar disorder ([place])    Depression    History of gout 2018   right hand 2 fingers and right wrist-- 05-20-2017 per pt resolved   Hyperplasia of prostate with lower urinary tract symptoms (LUTS)    Hypertension    OSA on CPAP    per study 03-22-2013  moderate OSA w/ AHI 26.8/hr   Pinched cervical nerve root    PT STATES C 7-8 PINCHED NERVE CAUSING NUMBNESS MIDDLE, RING AND LITTLE FINGER LEFT HAND - NO PAIN - STATES HE IS TRYING TO AVOID SURGERY.   Pre-diabetes    followed by pcp   Prostate  cancer [facility]) urologist-  dr Alyson Ingles  oncologist-  dr Tammi Klippel   dx 05-24-2016  Stage T1c, Gleason 4+3, PSA 5.49, vol 65.5cc/  pt seek second opinion's at [facility] and [facility] not candidate for focal laser ablation/  scheduled for radiactive seed implants 05-26-2017   Wears glasses     Past Surgical History:  Procedure Laterality Date   CIRCUMCISION  1973 approx.   KNEE ARTHROSCOPY Left 04/28/2013   Procedure: LEFT ARTHROSCOPY KNEE WITH DEBRIDEMENT meniscal debridement;  Surgeon: Gearlean Alf, MD;  Location: [facility];  Service: Orthopedics;  Laterality: Left;   KNEE SURGERY Right 2008   MR GUIDED PROSTATE BIOPSY  12-12-2016    [facility] [place], [place])   w/ general anesthesia   PROSTATE BIOPSY  05-29-2016   dr Alyson Ingles office   RADIOACTIVE SEED IMPLANT N/A 05/26/2017   Procedure: RADIOACTIVE SEED IMPLANT/BRACHYTHERAPY IMPLANT;  Surgeon: Cleon Gustin, MD;  Location: [facility];  Service: Urology;  Laterality: N/A;   SPACE OAR INSTILLATION N/A 05/26/2017   Procedure: SPACE OAR INSTILLATION;  Surgeon: Cleon Gustin, MD;  Location: [facility];  Service: Urology;  Laterality: N/A;   TONSILLECTOMY  1970s   TOTAL KNEE ARTHROPLASTY N/A 08/23/2019   Procedure: TOTAL KNEE ARTHROPLASTY;  Surgeon: Gaynelle Arabian,  MD;  Location: [facility];  Service: Orthopedics;  Laterality: N/A;    Family History  Problem Relation Age of Onset   Renal cancer Father    Heart attack Paternal Grandmother    Heart attack Paternal Grandfather    Cancer Paternal Aunt        breast    Social History   Socioeconomic History   Marital status: Divorced    Spouse name: Not on file   Number of children: 2   Years of education: College   Highest education level: Not on file  Occupational History   Occupation: Semi-Retired  Tobacco Use   Smoking status: Former    Packs/day: 1.50    Years: 20.00    Pack years: 30.00    Types: Cigarettes    Quit date: 05/26/1985    Years  since quitting: 35.7   Smokeless tobacco: Never  Vaping Use   Vaping Use: Never used  Substance and Sexual Activity   Alcohol use: Yes    Comment: occasional wine   Drug use: No   Sexual activity: Yes  Other Topics Concern   Not on file  Social History Narrative   Patient lives at home alone.   Caffeine Use: occasionally   Social Determinants of Health   Financial Resource Strain: Not on file  Food Insecurity: Not on file  Transportation Needs: Not on file  Physical Activity: Not on file  Stress: Not on file  Social Connections: Not on file  Intimate Partner Violence: Not on file    ROS Review of Systems  Constitutional:  Positive for fatigue.  HENT: Negative.    Eyes: Negative.   Respiratory:  Negative for shortness of breath.   Cardiovascular:  Negative for chest pain.  Gastrointestinal: Negative.   Endocrine: Negative.   Genitourinary: Negative.   Musculoskeletal: Negative.   Skin: Negative.   Allergic/Immunologic: Negative.   Neurological: Negative.   Hematological: Negative.   Psychiatric/Behavioral:  Positive for dysphoric mood and sleep disturbance. Negative for self-injury and suicidal ideas. The patient is not nervous/anxious.    Objective:   Today's Vitals: BP (!) 145/76 (BP Location: Left Arm, Patient Position: Sitting, Cuff Size: Normal)    Pulse 77    Temp 98.2 F (36.8 C) (Oral)    Resp 18    Ht 5' 7.5" (1.715 m)    Wt 225 lb (102.1 kg)    SpO2 100%    BMI 34.72 kg/m   Physical Exam Vitals and nursing note reviewed.  Constitutional:      Appearance: Normal appearance.  HENT:     Head: Normocephalic and atraumatic.     Right Ear: External ear normal.     Left Ear: External ear normal.     Nose: Nose normal.     Mouth/Throat:     Mouth: Mucous membranes are moist.     Pharynx: Oropharynx is clear.  Eyes:     Extraocular Movements: Extraocular movements intact.     Conjunctiva/sclera: Conjunctivae normal.     Pupils: Pupils are equal, round,  and reactive to light.  Cardiovascular:     Rate and Rhythm: Normal rate and regular rhythm.     Pulses: Normal pulses.     Heart sounds: Normal heart sounds.  Pulmonary:     Effort: Pulmonary effort is normal.     Breath sounds: Normal breath sounds.  Musculoskeletal:        General: Normal range of motion.     Cervical back: Normal range  of motion and neck supple.  Skin:    General: Skin is warm and dry.  Neurological:     General: No focal deficit present.     Mental Status: He is alert and oriented to person, place, and time.  Psychiatric:        Mood and Affect: Mood normal.        Behavior: Behavior normal.        Thought Content: Thought content normal.        Judgment: Judgment normal.    Assessment & Plan:   Problem List Items Addressed This Visit       Other   Depression - Primary    Outpatient Encounter Medications as of 02/14/2021  Medication Sig   allopurinol (ZYLOPRIM) 300 MG tablet Take 300 mg by mouth daily.   AMBULATORY NON FORMULARY MEDICATION 0.2 mLs by Intracavernosal route as needed. Medication Name: Trimix  PGE 39mcg Pap 30mg  Phent 1mg    amLODipine (NORVASC) 10 MG tablet Take 10 mg by mouth daily.   cyclobenzaprine (FLEXERIL) 5 MG tablet Take 1 tablet (5 mg total) by mouth 3 (three) times daily as needed for muscle spasms.   donepezil (ARICEPT) 10 MG tablet Take 1 tablet (10 mg total) by mouth at bedtime.   finasteride (PROSCAR) 5 MG tablet Take 1 tablet (5 mg total) by mouth daily.   losartan (COZAAR) 100 MG tablet Take 100 mg by mouth at bedtime.    magnesium oxide (MAG-OX) 400 MG tablet Take 400 mg by mouth daily.   memantine (NAMENDA) 10 MG tablet Take 1 tablet (10 mg total) by mouth 2 (two) times daily. Must keep follow up 12/04/2020 for ongoing refills   mirabegron ER (MYRBETRIQ) 50 MG TB24 tablet Take 1 tablet (50 mg total) by mouth daily.   tadalafil (CIALIS) 20 MG tablet Take 1 tablet (20 mg total) by mouth daily as needed for erectile  dysfunction.   tamsulosin (FLOMAX) 0.4 MG CAPS capsule Take 1 capsule (0.4 mg total) by mouth daily.   vardenafil (LEVITRA) 20 MG tablet Take 1 tablet (20 mg total) by mouth as needed for erectile dysfunction.   [DISCONTINUED] magnesium oxide (MAG-OX) 400 (240 Mg) MG tablet magnesium oxide 400 mg (241.3 mg magnesium) tablet   400 mg by oral route.   No facility-administered encounter medications on file as of 02/14/2021.   1. Other depression Patient education given on coping skills, lifestyle modifications.  Continue CBT, red flags given for prompt reevaluation. [place] Office Visit from 02/14/2021 in [place] [facility]  PHQ-9 Total Score 6       I have reviewed the patient's medical history (PMH, PSH, Social History, Family History, Medications, and allergies) , and have been updated if relevant. I spent 30 minutes reviewing chart and  face to face time with patient.   Follow-up: Return if symptoms worsen or fail to improve.   Loraine Grip Mayers, PA-C

## 2021-02-14 NOTE — Progress Notes (Signed)
Patient has eaten and taken medication today Patient denies pain at this time. Patient reports increased fatigue.

## 2021-03-20 ENCOUNTER — Telehealth: Payer: Medicare Other | Admitting: Adult Health

## 2021-03-20 ENCOUNTER — Telehealth: Payer: Self-pay | Admitting: Adult Health

## 2021-03-20 NOTE — Telephone Encounter (Signed)
Spoke with the patient.  Discussed that we have not seen him in almost a year and a half and we would need to do a memory screen this time in office.  Patient verbalized understanding.  Unfortunately he was having technical difficulties today.  He ended up having to change his password, etc. Tried to call our office to say he was in the process of trying to join the visit. He was unable to come next Monday for a visit, so I scheduled him for next availability on 07/23/21 at 1:30 PM. I assured him we will place him on the wait list.  Also advised him to keep an eye on his phone as he may receive a text if there is a cancellation he can get scheduled that way as well. Pt was appreciative.

## 2021-03-20 NOTE — Telephone Encounter (Signed)
Pt would like a call back to discuss a MyChart Video Visit appt.  Pt had technical difficulty getting on MyChar for appt on 03/20/21. Message Megan, NP; was instructed to schedule an OV. Pt refuse to schedule an OV want to speak with Megan.

## 2021-04-17 ENCOUNTER — Other Ambulatory Visit: Payer: Self-pay | Admitting: Urology

## 2021-04-17 DIAGNOSIS — N3281 Overactive bladder: Secondary | ICD-10-CM

## 2021-04-26 ENCOUNTER — Encounter: Payer: Self-pay | Admitting: Adult Health

## 2021-04-26 ENCOUNTER — Ambulatory Visit: Payer: Medicare Other | Admitting: Adult Health

## 2021-04-26 VITALS — BP 137/76 | HR 94 | Ht 68.0 in | Wt 228.0 lb

## 2021-04-26 DIAGNOSIS — Z9989 Dependence on other enabling machines and devices: Secondary | ICD-10-CM

## 2021-04-26 DIAGNOSIS — R413 Other amnesia: Secondary | ICD-10-CM

## 2021-04-26 DIAGNOSIS — G4733 Obstructive sleep apnea (adult) (pediatric): Secondary | ICD-10-CM

## 2021-04-26 NOTE — Patient Instructions (Signed)
Your Plan:  Continue Namenda  Restart CPAP     Thank you for coming to see [place] at [facility]. I hope we have been able to provide you high quality care today.  You may receive a patient satisfaction survey over the next few weeks. We would appreciate your feedback and comments so that we may continue to improve ourselves and the health of our patients.

## 2021-04-26 NOTE — Progress Notes (Signed)
PATIENT: Paul Moon DOB: 1945-11-14  REASON FOR VISIT: follow up HISTORY FROM: patient  HISTORY OF PRESENT ILLNESS: Today 04/26/21:  Paul Moon is a 76 year old male with a history of memory disturbance.  He returns today for follow-up.  He reports that his memory has remained stable.  Reports that his memory has improved with Namenda.  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  No difficulty with maintaining his job.  Able to complete all ADLs independently.  Reports he has not been using his CPAP for over 6 months.  However after we discussed the risk associated with untreated sleep apnea he states that he will be restarting his CPAP.  Had trouble with his mask i which led him to stop using it.  Returns today for an evaluation  11/15/19: Paul Moon is a 76 year old male with a history of memory disturbance.  He returns today for follow-up.  He reports overall he has been stable.  He is able to complete all ADLs independently.  He operates a Teacher, music without difficulty.  He manages his own finances.  He reports that he is semiretired.  Continues to work part-time.  He returns today for an evaluation.  HISTORY 11/24/18:   Paul Moon is a 76 year old male with a history of memory loss.  He returns today for follow-up.  He reports that his memory has been stable.  He is able to complete all ADLs independently.  He manages his own finances without difficulty.  He does not do any cooking but this has been the norm for him.  Denies any changes in his mood or behavior.  He states that his significant other feels that his memory is worse.  He states that if he forgets to get something at the grocery store she considers that a problem with his memory.  However he states that sometimes he is just preoccupied with other things and does not always process what she is asking.  He continues on Namenda and Aricept.  Tolerates these medications well.  Returns today for an evaluation.  REVIEW OF SYSTEMS:  Out of a complete 14 system review of symptoms, the patient complains only of the following symptoms, and all other reviewed systems are negative.  See HPI  ALLERGIES: Allergies  Allergen Reactions   Lisinopril Cough   Hctz [Hydrochlorothiazide]     Unknown reaction   Lactose Intolerance (Gi)    Quetiapine Fumarate Er Other (See Comments)   Risperdal [Risperidone] Other (See Comments)    "not be lucid, be foggy"   Seroquel [Quetiapine Fumarate] Other (See Comments)    Joint swelling   Seroquel [Quetiapine] Other (See Comments)    HOME MEDICATIONS: Outpatient Medications Prior to Visit  Medication Sig Dispense Refill   allopurinol (ZYLOPRIM) 300 MG tablet Take 300 mg by mouth daily.     AMBULATORY NON FORMULARY MEDICATION 0.2 mLs by Intracavernosal route as needed. Medication Name: Trimix  PGE 52mcg Pap 30mg  Phent 1mg  5 mL 5   amLODipine (NORVASC) 10 MG tablet Take 10 mg by mouth daily.     cyclobenzaprine (FLEXERIL) 5 MG tablet Take 1 tablet (5 mg total) by mouth 3 (three) times daily as needed for muscle spasms. 30 tablet 0   donepezil (ARICEPT) 10 MG tablet Take 1 tablet (10 mg total) by mouth at bedtime. 7 tablet 0   finasteride (PROSCAR) 5 MG tablet Take 1 tablet (5 mg total) by mouth daily. 90 tablet 3   losartan (COZAAR) 100 MG tablet  Take 100 mg by mouth at bedtime.      magnesium oxide (MAG-OX) 400 MG tablet Take 400 mg by mouth daily.     memantine (NAMENDA) 10 MG tablet Take 1 tablet (10 mg total) by mouth 2 (two) times daily. Must keep follow up 12/04/2020 for ongoing refills 180 tablet 1   mirabegron ER (MYRBETRIQ) 50 MG TB24 tablet Take 1 tablet (50 mg total) by mouth daily. 90 tablet 3   tadalafil (CIALIS) 20 MG tablet Take 1 tablet (20 mg total) by mouth daily as needed for erectile dysfunction. 10 tablet 5   tamsulosin (FLOMAX) 0.4 MG CAPS capsule TAKE 1 CAPSULE BY MOUTH  DAILY 90 capsule 3   vardenafil (LEVITRA) 20 MG tablet Take 1 tablet (20 mg total) by mouth  as needed for erectile dysfunction. 10 tablet 5   No facility-administered medications prior to visit.    PAST MEDICAL HISTORY: Past Medical History:  Diagnosis Date   Arthritis    INDEX FINGER JOINT LEFT HAND, right knee   Bipolar disorder ([place])    Depression    History of gout 2018   right hand 2 fingers and right wrist-- 05-20-2017 per pt resolved   Hyperplasia of prostate with lower urinary tract symptoms (LUTS)    Hypertension    OSA on CPAP    per study 03-22-2013  moderate OSA w/ AHI 26.8/hr   Pinched cervical nerve root    PT STATES C 7-8 PINCHED NERVE CAUSING NUMBNESS MIDDLE, RING AND LITTLE FINGER LEFT HAND - NO PAIN - STATES HE IS TRYING TO AVOID SURGERY.   Pre-diabetes    followed by pcp   Prostate cancer [facility]) urologist-  dr Alyson Ingles  oncologist-  dr Tammi Klippel   dx 05-24-2016  Stage T1c, Gleason 4+3, PSA 5.49, vol 65.5cc/  pt seek second opinion's at [facility] and [facility] not candidate for focal laser ablation/  scheduled for radiactive seed implants 05-26-2017   Wears glasses     PAST SURGICAL HISTORY: Past Surgical History:  Procedure Laterality Date   CIRCUMCISION  1973 approx.   KNEE ARTHROSCOPY Left 04/28/2013   Procedure: LEFT ARTHROSCOPY KNEE WITH DEBRIDEMENT meniscal debridement;  Surgeon: Gearlean Alf, MD;  Location: [facility];  Service: Orthopedics;  Laterality: Left;   KNEE SURGERY Right 2008   Paul GUIDED PROSTATE BIOPSY  12-12-2016    [facility] [place], [place])   w/ general anesthesia   PROSTATE BIOPSY  05-29-2016   dr Alyson Ingles office   RADIOACTIVE SEED IMPLANT N/A 05/26/2017   Procedure: RADIOACTIVE SEED IMPLANT/BRACHYTHERAPY IMPLANT;  Surgeon: Cleon Gustin, MD;  Location[facility];  Service: Urology;  Laterality: N/A;   SPACE OAR INSTILLATION N/A 05/26/2017   Procedure: SPACE OAR INSTILLATION;  Surgeon: Cleon Gustin, MD;  Location: [facility];  Service: Urology;  Laterality: N/A;   TONSILLECTOMY  1970s    TOTAL KNEE ARTHROPLASTY N/A 08/23/2019   Procedure: TOTAL KNEE ARTHROPLASTY;  Surgeon: Gaynelle Arabian, MD;  Location: [facility];  Service: Orthopedics;  Laterality: N/A;    FAMILY HISTORY: Family History  Problem Relation Age of Onset   Renal cancer Father    Heart attack Paternal Grandmother    Heart attack Paternal Grandfather    Cancer Paternal Aunt        breast    SOCIAL HISTORY: Social History   Socioeconomic History   Marital status: Divorced    Spouse name: Not on file   Number of children: 2  Years of education: College   Highest education level: Not on file  Occupational History   Occupation: Semi-Retired  Tobacco Use   Smoking status: Former    Packs/day: 1.50    Years: 20.00    Pack years: 30.00    Types: Cigarettes    Quit date: 05/26/1985    Years since quitting: 35.9   Smokeless tobacco: Never  Vaping Use   Vaping Use: Never used  Substance and Sexual Activity   Alcohol use: Yes    Comment: occasional wine   Drug use: No   Sexual activity: Yes  Other Topics Concern   Not on file  Social History Narrative   Patient lives at home alone.   Caffeine Use: occasionally   Social Determinants of Health   Financial Resource Strain: Not on file  Food Insecurity: Not on file  Transportation Needs: Not on file  Physical Activity: Not on file  Stress: Not on file  Social Connections: Not on file  Intimate Partner Violence: Not on file      PHYSICAL EXAM  Vitals:   04/26/21 1047  BP: 137/76  Pulse: 94  Weight: 228 lb (103.4 kg)  Height: 5\' 8"  (1.727 m)    Body mass index is 34.67 kg/m.   MMSE - Mini Mental State Exam 11/15/2019 11/24/2018 11/18/2017  Orientation to time 5 5 5   Orientation to Place 5 5 5   Registration 3 3 3   Attention/ Calculation 5 5 5   Recall 3 3 3   Language- name 2 objects 2 2 2   Language- repeat 1 1 1   Language- follow 3 step command 3 3 3   Language- read & follow direction 1 1 1   Write a sentence 1 1 1   Copy design 1 1  1   Total score 30 30 30      Generalized: Well developed, in no acute distress   Neurological examination  Mentation: Alert oriented to time, place, history taking. Follows all commands speech and language fluent Cranial nerve II-XII: Pupils were equal round reactive to light. Extraocular movements were full, visual field were full on confrontational test.  Head turning and shoulder shrug  were normal and symmetric. Motor: The motor testing reveals 5 over 5 strength of all 4 extremities. Good symmetric motor tone is noted throughout.  Sensory: Sensory testing is intact to soft touch on all 4 extremities. No evidence of extinction is noted.  Coordination: Cerebellar testing reveals good finger-nose-finger and heel-to-shin bilaterally.  Gait and station: Gait is normal.     DIAGNOSTIC DATA (LABS, IMAGING, TESTING) - I reviewed patient records, labs, notes, testing and imaging myself where available.  Lab Results  Component Value Date   WBC 8.1 08/24/2019   HGB 11.9 (L) 08/24/2019   HCT 35.6 (L) 08/24/2019   MCV 93.0 08/24/2019   PLT 248 08/24/2019      Component Value Date/Time   NA 134 (L) 08/24/2019 0301   K 4.4 08/24/2019 0301   CL 101 08/24/2019 0301   CO2 24 08/24/2019 0301   GLUCOSE 150 (H) 08/24/2019 0301   BUN 13 08/24/2019 0301   CREATININE 1.70 (H) 10/31/2020 1654   CALCIUM 8.6 (L) 08/24/2019 0301   PROT 7.4 08/16/2019 0844   ALBUMIN 4.0 08/16/2019 0844   AST 23 08/16/2019 0844   ALT 23 08/16/2019 0844   ALKPHOS 73 08/16/2019 0844   BILITOT 0.7 08/16/2019 0844   GFRNONAA 58 (L) 08/24/2019 0301   GFRAA >60 08/24/2019 0301    Lab Results  Component  Value Date   TSH 2.620 03/22/2013      ASSESSMENT AND PLAN 76 y.o. year old male  has a past medical history of Arthritis, Bipolar disorder ([place]), Depression, History of gout (2018), Hyperplasia of prostate with lower urinary tract symptoms (LUTS), Hypertension, OSA on CPAP, Pinched cervical nerve root,  Pre-diabetes, Prostate cancer [facility] (urologist-  dr Alyson Ingles  oncologist-  dr Tammi Klippel), and Wears glasses. here with:  1.  Memory disturbance  Continue Aricept and Namenda   2.  Obstructive sleep apnea on CPAP  Restart CPAP therapy Order sent for mask refitting and new supplies    Ward Givens, MSN, NP-C 04/26/2021, 10:42 AM [facility] [address], [place], [place] [address] [phone]

## 2021-04-26 NOTE — Progress Notes (Signed)
Received fax confirmation that DME sent new order for cpap supplies.  Mask refitting.  [phone].

## 2021-05-20 ENCOUNTER — Other Ambulatory Visit: Payer: Self-pay | Admitting: Urology

## 2021-05-20 DIAGNOSIS — N3281 Overactive bladder: Secondary | ICD-10-CM

## 2021-05-20 DIAGNOSIS — C61 Malignant neoplasm of prostate: Secondary | ICD-10-CM

## 2021-07-16 ENCOUNTER — Other Ambulatory Visit: Payer: Medicare Other

## 2021-07-16 DIAGNOSIS — C61 Malignant neoplasm of prostate: Secondary | ICD-10-CM

## 2021-07-17 LAB — PSA: Prostate Specific Ag, Serum: 0.2 ng/mL (ref 0.0–4.0)

## 2021-07-23 ENCOUNTER — Ambulatory Visit: Payer: Medicare Other | Admitting: Adult Health

## 2021-07-23 ENCOUNTER — Encounter: Payer: Self-pay | Admitting: Urology

## 2021-07-23 ENCOUNTER — Ambulatory Visit: Payer: Medicare Other | Admitting: Urology

## 2021-07-23 ENCOUNTER — Encounter: Payer: Self-pay | Admitting: Adult Health

## 2021-07-23 VITALS — BP 107/58 | HR 96 | Ht 67.0 in | Wt 227.4 lb

## 2021-07-23 VITALS — BP 112/64 | HR 102

## 2021-07-23 DIAGNOSIS — Z9989 Dependence on other enabling machines and devices: Secondary | ICD-10-CM | POA: Diagnosis not present

## 2021-07-23 DIAGNOSIS — Z8546 Personal history of malignant neoplasm of prostate: Secondary | ICD-10-CM

## 2021-07-23 DIAGNOSIS — G4733 Obstructive sleep apnea (adult) (pediatric): Secondary | ICD-10-CM

## 2021-07-23 DIAGNOSIS — N5201 Erectile dysfunction due to arterial insufficiency: Secondary | ICD-10-CM | POA: Diagnosis not present

## 2021-07-23 DIAGNOSIS — N3281 Overactive bladder: Secondary | ICD-10-CM

## 2021-07-23 DIAGNOSIS — C61 Malignant neoplasm of prostate: Secondary | ICD-10-CM

## 2021-07-23 DIAGNOSIS — R413 Other amnesia: Secondary | ICD-10-CM | POA: Diagnosis not present

## 2021-07-23 LAB — URINALYSIS, ROUTINE W REFLEX MICROSCOPIC
Bilirubin, UA: NEGATIVE
Glucose, UA: NEGATIVE
Ketones, UA: NEGATIVE
Leukocytes,UA: NEGATIVE
Nitrite, UA: NEGATIVE
Specific Gravity, UA: 1.025 (ref 1.005–1.030)
Urobilinogen, Ur: 0.2 mg/dL (ref 0.2–1.0)
pH, UA: 5.5 (ref 5.0–7.5)

## 2021-07-23 LAB — MICROSCOPIC EXAMINATION
Bacteria, UA: NONE SEEN
Renal Epithel, UA: NONE SEEN /hpf
WBC, UA: NONE SEEN /hpf (ref 0–5)

## 2021-07-23 MED ORDER — VARDENAFIL HCL 20 MG PO TABS: 20.0000 mg | ORAL_TABLET | ORAL | 5 refills | Status: DC | PRN

## 2021-07-23 NOTE — Patient Instructions (Signed)
Continue using CPAP nightly and greater than 4 hours each night °If your symptoms worsen or you develop new symptoms please let us know.  ° °

## 2021-07-23 NOTE — Progress Notes (Signed)
07/23/2021 8:52 AM   Paul Moon 1945/06/05 [id number]  Referring provider: Sonia Side., FNP [address],   [id number]  Followup Prostate Cancer and Erectile dysfunction   HPI: Mr Robison is a 76yo here for followup for prostate cancer, OAB and erectile dysfunction. IPSS 2 QOL 1 on finasteride, flomax 0.'4mg'$ , and mirabegron '50mg'$  daily. PSA 0.2. He has decreased libido. He tried trimix and stopped the medication. He is back on vardenafil.    PMH: Past Medical History:  Diagnosis Date   Arthritis    INDEX FINGER JOINT LEFT HAND, right knee   Bipolar disorder ([place])    Depression    History of gout 2018   right hand 2 fingers and right wrist-- 05-20-2017 per pt resolved   Hyperplasia of prostate with lower urinary tract symptoms (LUTS)    Hypertension    OSA on CPAP    per study 03-22-2013  moderate OSA w/ AHI 26.8/hr   Pinched cervical nerve root    PT STATES C 7-8 PINCHED NERVE CAUSING NUMBNESS MIDDLE, RING AND LITTLE FINGER LEFT HAND - NO PAIN - STATES HE IS TRYING TO AVOID SURGERY.   Pre-diabetes    followed by pcp   Prostate cancer [facility]) urologist-  dr Alyson Ingles  oncologist-  dr Tammi Klippel   dx 05-24-2016  Stage T1c, Gleason 4+3, PSA 5.49, vol 65.5cc/  pt seek second opinion's at [facility] and [facility] not candidate for focal laser ablation/  scheduled for radiactive seed implants 05-26-2017   Wears glasses     Surgical History: Past Surgical History:  Procedure Laterality Date   CIRCUMCISION  1973 approx.   KNEE ARTHROSCOPY Left 04/28/2013   Procedure: LEFT ARTHROSCOPY KNEE WITH DEBRIDEMENT meniscal debridement;  Surgeon: Gearlean Alf, MD;  [facility];  Service: Orthopedics;  Laterality: Left;   KNEE SURGERY Right 2008   MR GUIDED PROSTATE BIOPSY  12-12-2016    [facility] [place], [place])   w/ general anesthesia   PROSTATE BIOPSY  05-29-2016   dr Alyson Ingles office   RADIOACTIVE SEED IMPLANT N/A 05/26/2017   Procedure: RADIOACTIVE SEED  IMPLANT/BRACHYTHERAPY IMPLANT;  Surgeon: Cleon Gustin, MD;  Location: [facility];  Service: Urology;  Laterality: N/A;   SPACE OAR INSTILLATION N/A 05/26/2017   Procedure: SPACE OAR INSTILLATION;  Surgeon: Cleon Gustin, MD;  Location: [facility];  Service: Urology;  Laterality: N/A;   TONSILLECTOMY  1970s   TOTAL KNEE ARTHROPLASTY N/A 08/23/2019   Procedure: TOTAL KNEE ARTHROPLASTY;  Surgeon: Gaynelle Arabian, MD;  Location[facility];  Service: Orthopedics;  Laterality: N/A;    Home Medications:  Allergies as of 07/23/2021       Reactions   Lisinopril Cough   Hctz [hydrochlorothiazide]    Unknown reaction   Lactose Intolerance (gi)    Quetiapine Fumarate Er Other (See Comments)   Risperdal [risperidone] Other (See Comments)   "not be lucid, be foggy"   Seroquel [quetiapine Fumarate] Other (See Comments)   Joint swelling   Seroquel [quetiapine] Other (See Comments)        Medication List        Accurate as of Jul 23, 2021  8:52 AM. If you have any questions, ask your nurse or doctor.          allopurinol 300 MG tablet Commonly known as: ZYLOPRIM Take 300 mg by mouth daily.   AMBULATORY NON FORMULARY MEDICATION 0.2 mLs by Intracavernosal route as needed. Medication Name: Trimix  PGE 21mg Pap '30mg'$  Phent '1mg'$    amLODipine 10 MG tablet Commonly known as: NORVASC Take 10 mg by mouth daily.   cyclobenzaprine 5 MG tablet Commonly known as: FLEXERIL Take 1 tablet (5 mg total) by mouth 3 (three) times daily as needed for muscle spasms.   donepezil 10 MG tablet Commonly known as: ARICEPT Take 1 tablet (10 mg total) by mouth at bedtime.   finasteride 5 MG tablet Commonly known as: PROSCAR TAKE 1 TABLET BY MOUTH  DAILY   losartan 100 MG tablet Commonly known as: COZAAR Take 100 mg by mouth at bedtime.   magnesium oxide 400 MG tablet Commonly known as: MAG-OX Take 400 mg by mouth daily.   memantine 10 MG tablet Commonly  known as: NAMENDA Take 1 tablet (10 mg total) by mouth 2 (two) times daily. Must keep follow up 12/04/2020 for ongoing refills   Myrbetriq 50 MG Tb24 tablet Generic drug: mirabegron ER TAKE 1 TABLET BY MOUTH  DAILY   tadalafil 20 MG tablet Commonly known as: CIALIS Take 1 tablet (20 mg total) by mouth daily as needed for erectile dysfunction.   tamsulosin 0.4 MG Caps capsule Commonly known as: FLOMAX TAKE 1 CAPSULE BY MOUTH  DAILY   traZODone 50 MG tablet Commonly known as: DESYREL Take 50 mg by mouth at bedtime.   vardenafil 20 MG tablet Commonly known as: LEVITRA Take 1 tablet (20 mg total) by mouth as needed for erectile dysfunction.        Allergies:  Allergies  Allergen Reactions   Lisinopril Cough   Hctz [Hydrochlorothiazide]     Unknown reaction   Lactose Intolerance (Gi)    Quetiapine Fumarate Er Other (See Comments)   Risperdal [Risperidone] Other (See Comments)    "not be lucid, be foggy"   Seroquel [Quetiapine Fumarate] Other (See Comments)    Joint swelling   Seroquel [Quetiapine] Other (See Comments)    Family History: Family History  Problem Relation Age of Onset   Renal cancer Father    Cancer Paternal Aunt        breast   Heart attack Paternal Grandmother    Heart attack Paternal Grandfather    Sleep apnea Neg Hx     Social History:  reports that he quit smoking about 36 years ago. His smoking use included cigarettes. He has a 30.00 pack-year smoking history. He has never used smokeless tobacco. He reports current alcohol use. He reports that he does not use drugs.  ROS: All other review of systems were reviewed and are negative except what is noted above in HPI  Physical Exam: BP 112/64   Pulse (!) 102   Constitutional:  Alert and oriented, No acute distress. HEENT: [place] AT, moist mucus membranes.  Trachea midline, no masses. Cardiovascular: No clubbing, cyanosis, or edema. Respiratory: Normal respiratory effort, no increased work of  breathing. GI: Abdomen is soft, nontender, nondistended, no abdominal masses GU: No CVA tenderness.  Lymph: No cervical or inguinal lymphadenopathy. Skin: No rashes, bruises or suspicious lesions. Neurologic: Grossly intact, no focal deficits, moving all 4 extremities. Psychiatric: Normal mood and affect.  Laboratory Data: Lab Results  Component Value Date   WBC 8.1 08/24/2019   HGB 11.9 (L) 08/24/2019   HCT 35.6 (L) 08/24/2019   MCV 93.0 08/24/2019   PLT 248 08/24/2019    Lab Results  Component Value Date   CREATININE 1.70 (H) 10/31/2020    No results found for: PSA  Lab Results  Component Value Date  TESTOSTERONE 473 05/03/2020    No results found for: HGBA1C  Urinalysis    Component Value Date/Time   APPEARANCEUR Clear 07/14/2020 0927   GLUCOSEU Negative 07/14/2020 0927   BILIRUBINUR Negative 07/14/2020 0927   PROTEINUR Negative 07/14/2020 0927   NITRITE Negative 07/14/2020 0927   LEUKOCYTESUR Negative 07/14/2020 0927    Lab Results  Component Value Date   LABMICR See below: 07/14/2020   WBCUA None seen 07/14/2020   LABEPIT None seen 07/14/2020   MUCUS Present 07/14/2020   BACTERIA None seen 07/14/2020    Pertinent Imaging:  No results found for this or any previous visit.  No results found for this or any previous visit.  No results found for this or any previous visit.  No results found for this or any previous visit.  No results found for this or any previous visit.  No results found for this or any previous visit.  No results found for this or any previous visit.  No results found for this or any previous visit.   Assessment & Plan:    1. Prostate cancer ([place]) -RTC 1 year with PSA - Urinalysis, Routine w reflex microscopic  2. OAB (overactive bladder) -Continue mirabegron '50mg'$  daily  3. Erectile dysfunction due to arterial insufficiency -vardenafil prn   No follow-ups on file.  [name], MD  [facility]  [place]

## 2021-07-23 NOTE — Progress Notes (Signed)
PATIENT: Paul Moon DOB: [birth year]  REASON FOR VISIT: follow up HISTORY FROM: patient  Chief Complaint  Patient presents with   Follow-up    Pt in 18  pt with CPAP follow . Pt states since he started to use CPAP his energy level is up and is BP is great       HISTORY OF PRESENT ILLNESS: Today 07/23/21:  Mr. Marcussen is a 76 year old male with a history of memory disturbance and obstructive sleep apnea on CPAP.  At the last visit he was instructed to restart CPAP. Since restarting energy level is better and BP is better. DL is better    04/26/21: Mr. Gopal is a 76 year old male with a history of memory disturbance.  He returns today for follow-up.  He reports that his memory has remained stable.  Reports that his memory has improved with Namenda.  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  No difficulty with maintaining his job.  Moon to complete all ADLs independently.  Reports he has not been using his CPAP for over 6 months.  However after we discussed the risk associated with untreated sleep apnea he states that he will be restarting his CPAP.  Had trouble with his mask i which led him to stop using it.  Returns today for an evaluation  11/15/19: Mr. Pepitone is a 76 year old male with a history of memory disturbance.  He returns today for follow-up.  He reports overall he has been stable.  He is Moon to complete all ADLs independently.  He operates a Teacher, music without difficulty.  He manages his own finances.  He reports that he is semiretired.  Continues to work part-time.  He returns today for an evaluation.  HISTORY 11/24/18:   Mr Hardge is a 76 year old male with a history of memory loss.  He returns today for follow-up.  He reports that his memory has been stable.  He is Moon to complete all ADLs independently.  He manages his own finances without difficulty.  He does not do any cooking but this has been the norm for him.  Denies any changes in his mood or behavior.  He states  that his significant other feels that his memory is worse.  He states that if he forgets to get something at the grocery store she considers that a problem with his memory.  However he states that sometimes he is just preoccupied with other things and does not always process what she is asking.  He continues on Namenda and Aricept.  Tolerates these medications well.  Returns today for an evaluation.  REVIEW OF SYSTEMS: Out of a complete 14 system review of symptoms, the patient complains only of the following symptoms, and all other reviewed systems are negative.  See HPI  ALLERGIES: Allergies  Allergen Reactions   Lisinopril Cough   Hctz [Hydrochlorothiazide]     Unknown reaction   Lactose Intolerance (Gi)    Quetiapine Fumarate Er Other (See Comments)   Risperdal [Risperidone] Other (See Comments)    "not be lucid, be foggy"   Seroquel [Quetiapine Fumarate] Other (See Comments)    Joint swelling   Seroquel [Quetiapine] Other (See Comments)    HOME MEDICATIONS: Outpatient Medications Prior to Visit  Medication Sig Dispense Refill   allopurinol (ZYLOPRIM) 300 MG tablet Take 300 mg by mouth daily.     AMBULATORY NON FORMULARY MEDICATION 0.2 mLs by Intracavernosal route as needed. Medication Name: Trimix  PGE 18mg Pap '30mg'$  Phent '1mg'$   5 mL 5   amLODipine (NORVASC) 10 MG tablet Take 10 mg by mouth daily.     cyclobenzaprine (FLEXERIL) 5 MG tablet Take 1 tablet (5 mg total) by mouth 3 (three) times daily as needed for muscle spasms. (Patient not taking: Reported on 07/23/2021) 30 tablet 0   donepezil (ARICEPT) 10 MG tablet Take 1 tablet (10 mg total) by mouth at bedtime. 7 tablet 0   finasteride (PROSCAR) 5 MG tablet TAKE 1 TABLET BY MOUTH  DAILY 90 tablet 3   losartan (COZAAR) 100 MG tablet Take 100 mg by mouth at bedtime.      magnesium oxide (MAG-OX) 400 MG tablet Take 400 mg by mouth daily.     memantine (NAMENDA) 10 MG tablet Take 1 tablet (10 mg total) by mouth 2 (two) times  daily. Must keep follow up 12/04/2020 for ongoing refills 180 tablet 1   MYRBETRIQ 50 MG TB24 tablet TAKE 1 TABLET BY MOUTH  DAILY 90 tablet 3   tadalafil (CIALIS) 20 MG tablet Take 1 tablet (20 mg total) by mouth daily as needed for erectile dysfunction. (Patient not taking: Reported on 07/23/2021) 10 tablet 5   tamsulosin (FLOMAX) 0.4 MG CAPS capsule TAKE 1 CAPSULE BY MOUTH  DAILY 90 capsule 3   traZODone (DESYREL) 50 MG tablet Take 50 mg by mouth at bedtime.     vardenafil (LEVITRA) 20 MG tablet Take 1 tablet (20 mg total) by mouth as needed for erectile dysfunction. 10 tablet 5   No facility-administered medications prior to visit.    PAST MEDICAL HISTORY: Past Medical History:  Diagnosis Date   Arthritis    INDEX FINGER JOINT LEFT HAND, right knee   Bipolar disorder ([place])    Depression    History of gout 2018   right hand 2 fingers and right wrist-- 05-20-2017 per pt resolved   Hyperplasia of prostate with lower urinary tract symptoms (LUTS)    Hypertension    OSA on CPAP    per study 03-22-2013  moderate OSA w/ AHI 26.8/hr   Pinched cervical nerve root    PT STATES C 7-8 PINCHED NERVE CAUSING NUMBNESS MIDDLE, RING AND LITTLE FINGER LEFT HAND - NO PAIN - STATES HE IS TRYING TO AVOID SURGERY.   Pre-diabetes    followed by pcp   Prostate cancer [facility]) urologist-  dr Alyson Ingles  oncologist-  dr Tammi Klippel   dx 05-24-2016  Stage T1c, Gleason 4+3, PSA 5.49, vol 65.5cc/  pt seek second opinion's at [facility] and [facility] not candidate for focal laser ablation/  scheduled for radiactive seed implants 05-26-2017   Wears glasses     PAST SURGICAL HISTORY: Past Surgical History:  Procedure Laterality Date   CIRCUMCISION  1973 approx.   KNEE ARTHROSCOPY Left 04/28/2013   Procedure: LEFT ARTHROSCOPY KNEE WITH DEBRIDEMENT meniscal debridement;  Surgeon: Gearlean Alf, MD;  Location: [facility];  Service: Orthopedics;  Laterality: Left;   KNEE SURGERY Right 2008   MR GUIDED PROSTATE BIOPSY   12-12-2016    [facility] [place], [place])   w/ general anesthesia   PROSTATE BIOPSY  05-29-2016   dr Alyson Ingles office   RADIOACTIVE SEED IMPLANT N/A 05/26/2017   Procedure: RADIOACTIVE SEED IMPLANT/BRACHYTHERAPY IMPLANT;  Surgeon: Cleon Gustin, MD;  Location: [facility];  Service: Urology;  Laterality: N/A;   SPACE OAR INSTILLATION N/A 05/26/2017   Procedure: SPACE OAR INSTILLATION;  Surgeon: Cleon Gustin, MD;  Location: [facility];  Service: Urology;  Laterality: N/A;   TONSILLECTOMY  1970s   TOTAL KNEE ARTHROPLASTY N/A 08/23/2019   Procedure: TOTAL KNEE ARTHROPLASTY;  Surgeon: Gaynelle Arabian, MD;  Location: [facility];  Service: Orthopedics;  Laterality: N/A;    FAMILY HISTORY: Family History  Problem Relation Age of Onset   Renal cancer Father    Cancer Paternal Aunt        breast   Heart attack Paternal Grandmother    Heart attack Paternal Grandfather    Sleep apnea Neg Hx     SOCIAL HISTORY: Social History   Socioeconomic History   Marital status: Divorced    Spouse name: Not on file   Number of children: 2   Years of education: College   Highest education level: Not on file  Occupational History   Occupation: Semi-Retired  Tobacco Use   Smoking status: Former    Packs/day: 1.50    Years: 20.00    Pack years: 30.00    Types: Cigarettes    Quit date: 05/26/1985    Years since quitting: 36.1   Smokeless tobacco: Never  Vaping Use   Vaping Use: Never used  Substance and Sexual Activity   Alcohol use: Yes    Comment: occasional wine   Drug use: No   Sexual activity: Yes  Other Topics Concern   Not on file  Social History Narrative   Patient lives at home alone.   Caffeine Use: occasionally   Social Determinants of Health   Financial Resource Strain: Not on file  Food Insecurity: Not on file  Transportation Needs: Not on file  Physical Activity: Not on file  Stress: Not on file  Social Connections: Not on file   Intimate Partner Violence: Not on file      PHYSICAL EXAM  There were no vitals filed for this visit.   There is no height or weight on file to calculate BMI.      11/15/2019    9:01 AM 11/24/2018    8:22 AM 11/18/2017    7:26 AM  MMSE - Mini Mental State Exam  Orientation to time '5 5 5  '$ Orientation to Place '5 5 5  '$ Registration '3 3 3  '$ Attention/ Calculation '5 5 5  '$ Recall '3 3 3  '$ Language- name 2 objects '2 2 2  '$ Language- repeat '1 1 1  '$ Language- follow 3 step command '3 3 3  '$ Language- read & follow direction '1 1 1  '$ Write a sentence '1 1 1  '$ Copy design '1 1 1  '$ Total score '30 30 30     '$ Generalized: Well developed, in no acute distress   Neurological examination  Mentation: Alert oriented to time, place, history taking. Follows all commands speech and language fluent Cranial nerve II-XII: Pupils were equal round reactive to light. Extraocular movements were full, visual field were full on confrontational test.  Head turning and shoulder shrug  were normal and symmetric. Motor: The motor testing reveals 5 over 5 strength of all 4 extremities. Good symmetric motor tone is noted throughout.  Sensory: Sensory testing is intact to soft touch on all 4 extremities. No evidence of extinction is noted.  Coordination: Cerebellar testing reveals good finger-nose-finger and heel-to-shin bilaterally.  Gait and station: Gait is normal.     DIAGNOSTIC DATA (LABS, IMAGING, TESTING) - I reviewed patient records, labs, notes, testing and imaging myself where available.  Lab Results  Component Value Date   WBC 8.1 08/24/2019   HGB 11.9 (L) 08/24/2019   HCT 35.6 (  L) 08/24/2019   MCV 93.0 08/24/2019   PLT 248 08/24/2019      Component Value Date/Time   NA 134 (L) 08/24/2019 0301   K 4.4 08/24/2019 0301   CL 101 08/24/2019 0301   CO2 24 08/24/2019 0301   GLUCOSE 150 (H) 08/24/2019 0301   BUN 13 08/24/2019 0301   CREATININE 1.70 (H) 10/31/2020 1654   CALCIUM 8.6 (L) 08/24/2019 0301    PROT 7.4 08/16/2019 0844   ALBUMIN 4.0 08/16/2019 0844   AST 23 08/16/2019 0844   ALT 23 08/16/2019 0844   ALKPHOS 73 08/16/2019 0844   BILITOT 0.7 08/16/2019 0844   GFRNONAA 58 (L) 08/24/2019 0301   GFRAA >60 08/24/2019 0301    Lab Results  Component Value Date   TSH 2.620 03/22/2013      ASSESSMENT AND PLAN 76 y.o. year old male  has a past medical history of Arthritis, Bipolar disorder ([place]), Depression, History of gout (2018), Hyperplasia of prostate with lower urinary tract symptoms (LUTS), Hypertension, OSA on CPAP, Pinched cervical nerve root, Pre-diabetes, Prostate cancer [facility]) (urologist-  dr Alyson Ingles  oncologist-  dr Tammi Klippel), and Wears glasses. here with:  1.  Memory disturbance  Continue Aricept 10 mg at bedtime Continue Namenda '10mg'$  BID   2.  Obstructive sleep apnea on CPAP  Good Compliance Residual AHI in normal range Encourage patient to continue using CPAP nightly and greater than 4 hours each night  FU in 1 year or sooner if needed  Ward Givens, MSN, NP-C 07/23/2021, 1:43 PM [facility] [address] [place], [place] [address] [phone]

## 2021-07-23 NOTE — Patient Instructions (Signed)

## 2021-07-25 LAB — TESTOSTERONE,FREE AND TOTAL
Testosterone, Free: 8.2 pg/mL (ref 6.6–18.1)
Testosterone: 413 ng/dL (ref 264–916)

## 2021-10-29 ENCOUNTER — Telehealth: Payer: Self-pay

## 2021-10-29 NOTE — Telephone Encounter (Signed)
Patient called this morning advising he wished for you to contact him at your earliest convenience. I asked patient if he could advise what it was he needed to speak with you about. 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 I made patient aware that you were in clinic all day today and patient voiced understanding.

## 2021-11-30 ENCOUNTER — Encounter: Payer: Self-pay | Admitting: Urology

## 2021-11-30 ENCOUNTER — Ambulatory Visit: Payer: Medicare Other | Admitting: Urology

## 2021-11-30 VITALS — BP 143/67 | HR 89

## 2021-11-30 DIAGNOSIS — N3281 Overactive bladder: Secondary | ICD-10-CM | POA: Diagnosis not present

## 2021-11-30 DIAGNOSIS — N5201 Erectile dysfunction due to arterial insufficiency: Secondary | ICD-10-CM

## 2021-11-30 DIAGNOSIS — C61 Malignant neoplasm of prostate: Secondary | ICD-10-CM | POA: Diagnosis not present

## 2021-11-30 MED ORDER — AMOXICILLIN 250 MG PO CAPS: 250.0000 mg | ORAL_CAPSULE | Freq: Three times a day (TID) | ORAL | 0 refills | Status: AC

## 2021-11-30 NOTE — Progress Notes (Unsigned)
11/30/2021 12:40 PM   DEMAURI ADVINCULA [birth year] [id number]  Referring provider: Sonia Side., FNP [address],  [place] [address]  Followup OAB and erectile dysfunction   HPI: [name] is a 76yo here for followup for OAB and erectile dysfunction. He has been using vardenafil and trimix prn with fair results. He has tried sildenafil and tadalafil with poor results. He has stable LUTS on flomax 0.'4mg'$  daily and mirabegron '50mg'$  daily. He has occasional morning urinary incontinence.    PMH: Past Medical History:  Diagnosis Date   Arthritis    INDEX FINGER JOINT LEFT HAND, right knee   Bipolar disorder ([place])    Depression    History of gout 2018   right hand 2 fingers and right wrist-- 05-20-2017 per pt resolved   Hyperplasia of prostate with lower urinary tract symptoms (LUTS)    Hypertension    OSA on CPAP    per study 03-22-2013  moderate OSA w/ AHI 26.8/hr   Pinched cervical nerve root    PT STATES C 7-8 PINCHED NERVE CAUSING NUMBNESS MIDDLE, RING AND LITTLE FINGER LEFT HAND - NO PAIN - STATES HE IS TRYING TO AVOID SURGERY.   Pre-diabetes    followed by pcp   Prostate cancer [facility]) urologist-  dr Alyson Ingles  oncologist-  dr Tammi Klippel   dx 05-24-2016  Stage T1c, Gleason 4+3, PSA 5.49, vol 65.5cc/  pt seek [facility] opinion's [facility] and [facility] not candidate for focal laser ablation/  scheduled for radiactive seed implants 05-26-2017   Wears glasses     Surgical History: Past Surgical History:  Procedure Laterality Date   CIRCUMCISION  1973 approx.   KNEE ARTHROSCOPY Left 04/28/2013   Procedure: LEFT ARTHROSCOPY KNEE WITH DEBRIDEMENT meniscal debridement;  Surgeon: Gearlean Alf, MD;  [facility];  Service: Orthopedics;  Laterality: Left;   KNEE SURGERY Right 2008   MR GUIDED PROSTATE BIOPSY  12-12-2016    [facility] [place], [place])   w/ general anesthesia   PROSTATE BIOPSY  05-29-2016   dr Alyson Ingles office   RADIOACTIVE SEED IMPLANT N/A 05/26/2017    Procedure: RADIOACTIVE SEED IMPLANT/BRACHYTHERAPY IMPLANT;  Surgeon: Cleon Gustin, MD;  Location: [facility];  Service: Urology;  Laterality: N/A;   SPACE OAR INSTILLATION N/A 05/26/2017   Procedure: SPACE OAR INSTILLATION;  Surgeon: Cleon Gustin, MD;  Location: [facility];  Service: Urology;  Laterality: N/A;   TONSILLECTOMY  1970s   TOTAL KNEE ARTHROPLASTY N/A 08/23/2019   Procedure: TOTAL KNEE ARTHROPLASTY;  Surgeon: Gaynelle Arabian, MD;  [facility];  Service: Orthopedics;  Laterality: N/A;    Home Medications:  Allergies as of 11/30/2021       Reactions   Lisinopril Cough   Hctz [hydrochlorothiazide]    Unknown reaction   Lactose Intolerance (gi)    Quetiapine Fumarate Er Other (See Comments)   Risperdal [risperidone] Other (See Comments)   "not be lucid, be foggy"   Seroquel [quetiapine Fumarate] Other (See Comments)   Joint swelling   Seroquel [quetiapine] Other (See Comments)        Medication List        Accurate as of November 30, 2021 12:40 PM. If you have any questions, ask your nurse or doctor.          allopurinol 300 MG tablet Commonly known as: ZYLOPRIM Take 300 mg by mouth daily.   AMBULATORY NON FORMULARY MEDICATION 0.2 mLs by Intracavernosal route as  needed. Medication Name: Trimix  PGE 44mg Pap '30mg'$  Phent '1mg'$    amLODipine 10 MG tablet Commonly known as: NORVASC Take 10 mg by mouth daily.   cyclobenzaprine 5 MG tablet Commonly known as: FLEXERIL Take 1 tablet (5 mg total) by mouth 3 (three) times daily as needed for muscle spasms.   donepezil 10 MG tablet Commonly known as: ARICEPT Take 1 tablet (10 mg total) by mouth at bedtime.   finasteride 5 MG tablet Commonly known as: PROSCAR TAKE 1 TABLET BY MOUTH  DAILY   losartan 100 MG tablet Commonly known as: COZAAR Take 100 mg by mouth at bedtime.   magnesium oxide 400 MG tablet Commonly known as: MAG-OX Take 400 mg by mouth daily.    memantine 10 MG tablet Commonly known as: NAMENDA Take 1 tablet (10 mg total) by mouth 2 (two) times daily. Must keep follow up 12/04/2020 for ongoing refills   Myrbetriq 50 MG Tb24 tablet Generic drug: mirabegron ER TAKE 1 TABLET BY MOUTH  DAILY   tadalafil 20 MG tablet Commonly known as: CIALIS Take 1 tablet (20 mg total) by mouth daily as needed for erectile dysfunction.   tamsulosin 0.4 MG Caps capsule Commonly known as: FLOMAX TAKE 1 CAPSULE BY MOUTH  DAILY   traZODone 50 MG tablet Commonly known as: DESYREL Take 50 mg by mouth at bedtime.   vardenafil 20 MG tablet Commonly known as: LEVITRA Take 1 tablet (20 mg total) by mouth as needed for erectile dysfunction.        Allergies:  Allergies  Allergen Reactions   Lisinopril Cough   Hctz [Hydrochlorothiazide]     Unknown reaction   Lactose Intolerance (Gi)    Quetiapine Fumarate Er Other (See Comments)   Risperdal [Risperidone] Other (See Comments)    "not be lucid, be foggy"   Seroquel [Quetiapine Fumarate] Other (See Comments)    Joint swelling   Seroquel [Quetiapine] Other (See Comments)    Family History: Family History  Problem Relation Age of Onset   Renal cancer Father    Cancer Paternal Aunt        breast   Heart attack Paternal Grandmother    Heart attack Paternal Grandfather    Sleep apnea Neg Hx     Social History:  reports that he quit smoking about 36 years ago. His smoking use included cigarettes. He has a 30.00 pack-year smoking history. He has never used smokeless tobacco. He reports current alcohol use. He reports that he does not use drugs.  ROS: All other review of systems were reviewed and are negative except what is noted above in HPI  Physical Exam: BP (!) 143/67   Pulse 89   Constitutional:  Alert and oriented, No acute distress. HEENT: [place] AT, moist mucus membranes.  Trachea midline, no masses. Cardiovascular: No clubbing, cyanosis, or edema. Respiratory: Normal respiratory  effort, no increased work of breathing. GI: Abdomen is soft, nontender, nondistended, no abdominal masses GU: No CVA tenderness.  Lymph: No cervical or inguinal lymphadenopathy. Skin: No rashes, bruises or suspicious lesions. Neurologic: Grossly intact, no focal deficits, moving all 4 extremities. Psychiatric: Normal mood and affect.  Laboratory Data: Lab Results  Component Value Date   WBC 8.1 08/24/2019   HGB 11.9 (L) 08/24/2019   HCT 35.6 (L) 08/24/2019   MCV 93.0 08/24/2019   PLT 248 08/24/2019    Lab Results  Component Value Date   CREATININE 1.70 (H) 10/31/2020    No results found for: "PSA"  Lab  Results  Component Value Date   TESTOSTERONE 413 07/23/2021    No results found for: "HGBA1C"  Urinalysis    Component Value Date/Time   APPEARANCEUR Clear 07/23/2021 0859   GLUCOSEU Negative 07/23/2021 0859   BILIRUBINUR Negative 07/23/2021 0859   PROTEINUR 1+ (A) 07/23/2021 0859   NITRITE Negative 07/23/2021 0859   LEUKOCYTESUR Negative 07/23/2021 0859    Lab Results  Component Value Date   LABMICR See below: 07/23/2021   WBCUA None seen 07/23/2021   LABEPIT 0-10 07/23/2021   MUCUS Present 07/23/2021   BACTERIA None seen 07/23/2021    Pertinent Imaging:  No results found for this or any previous visit.  No results found for this or any previous visit.  No results found for this or any previous visit.  No results found for this or any previous visit.  No results found for this or any previous visit.  No valid procedures specified. No results found for this or any previous visit.  No results found for this or any previous visit.   Assessment & Plan:    1. Prostate cancer ([place]) -RTC 6 months with PSA  2. Erectile dysfunction due to arterial insufficiency -We discussed VED, muse and IPP. Patient is undecided at this time   3. OAB (overactive bladder) -continue mirabegron '50mg'$     No follow-ups on file.  [name], MD  [facility]  Urology [place]

## 2021-11-30 NOTE — Patient Instructions (Signed)
Erectile Dysfunction ?Erectile dysfunction (ED) is the inability to get or keep an erection in order to have sexual intercourse. ED is considered a symptom of an underlying disorder and is not considered a disease. ED may include: ?Inability to get an erection. ?Lack of enough hardness of the erection to allow penetration. ?Loss of erection before sex is finished. ?What are the causes? ?This condition may be caused by: ?Physical causes, such as: ?Artery problems. This may include heart disease, high blood pressure, atherosclerosis, and diabetes. ?Hormonal problems, such as low testosterone. ?Obesity. ?Nerve problems. This may include back or pelvic injuries, multiple sclerosis, Parkinson's disease, spinal cord injury, and stroke. ?Certain medicines, such as: ?Pain relievers. ?Antidepressants. ?Blood pressure medicines and water pills (diuretics). ?Cancer medicines. ?Antihistamines. ?Muscle relaxants. ?Lifestyle factors, such as: ?Use of drugs such as marijuana, cocaine, or opioids. ?Excessive use of alcohol. ?Smoking. ?Lack of physical activity or exercise. ?Psychological causes, such as: ?Anxiety or stress. ?Sadness or depression. ?Exhaustion. ?Fear about sexual performance. ?Guilt. ?What are the signs or symptoms? ?Symptoms of this condition include: ?Inability to get an erection. ?Lack of enough hardness of the erection to allow penetration. ?Loss of the erection before sex is finished. ?Sometimes having normal erections, but with frequent unsatisfactory episodes. ?Low sexual satisfaction in either partner due to erection problems. ?A curved penis occurring with erection. The curve may cause pain, or the penis may be too curved to allow for intercourse. ?Never having nighttime or morning erections. ?How is this diagnosed? ?This condition is often diagnosed by: ?Performing a physical exam to find other diseases or specific problems with the penis. ?Asking you detailed questions about the problem. ?Doing tests,  such as: ?Blood tests to check for diabetes mellitus or high cholesterol, or to measure hormone levels. ?Other tests to check for underlying health conditions. ?An ultrasound exam to check for scarring. ?A test to check blood flow to the penis. ?Doing a sleep study at home to measure nighttime erections. ?How is this treated? ?This condition may be treated by: ?Medicines, such as: ?Medicine taken by mouth to help you achieve an erection (oral medicine). ?Hormone replacement therapy to replace low testosterone levels. ?Medicine that is injected into the penis. Your health care provider may instruct you how to give yourself these injections at home. ?Medicine that is delivered with a short applicator tube. The tube is inserted into the opening at the tip of the penis, which is the opening of the urethra. A tiny pellet of medicine is put in the urethra. The pellet dissolves and enhances erectile function. This is also called MUSE (medicated urethral system for erections) therapy. ?Vacuum pump. This is a pump with a ring on it. The pump and ring are placed on the penis and used to create pressure that helps the penis become erect. ?Penile implant surgery. In this procedure, you may receive: ?An inflatable implant. This consists of cylinders, a pump, and a reservoir. The cylinders can be inflated with a fluid that helps to create an erection, and they can be deflated after intercourse. ?A semi-rigid implant. This consists of two silicone rubber rods. The rods provide some rigidity. They are also flexible, so the penis can both curve downward in its normal position and become straight for sexual intercourse. ?Blood vessel surgery to improve blood flow to the penis. During this procedure, a blood vessel from a different part of the body is placed into the penis to allow blood to flow around (bypass) damaged or blocked blood vessels. ?Lifestyle changes,   such as exercising more, losing weight, and quitting smoking. ?Follow  these instructions at home: ?Medicines ? ?Take over-the-counter and prescription medicines only as told by your health care provider. Do not increase the dosage without first discussing it with your health care provider. ?If you are using self-injections, do injections as directed by your health care provider. Make sure you avoid any veins that are on the surface of the penis. After giving an injection, apply pressure to the injection site for 5 minutes. ?Talk to your health care provider about how to prevent headaches while taking ED medicines. These medicines may cause a sudden headache due to the increase in blood flow in your body. ?General instructions ?Exercise regularly, as directed by your health care provider. Work with your health care provider to lose weight, if needed. ?Do not use any products that contain nicotine or tobacco. These products include cigarettes, chewing tobacco, and vaping devices, such as e-cigarettes. If you need help quitting, ask your health care provider. ?Before using a vacuum pump, read the instructions that come with the pump and discuss any questions with your health care provider. ?Keep all follow-up visits. This is important. ?Contact a health care provider if: ?You feel nauseous. ?You are vomiting. ?You get sudden headaches while taking ED medicines. ?You have any concerns about your sexual health. ?Get help right away if: ?You are taking oral or injectable medicines and you have an erection that lasts longer than 4 hours. If your health care provider is unavailable, go to the nearest emergency room for evaluation. An erection that lasts much longer than 4 hours can result in permanent damage to your penis. ?You have severe pain in your groin or abdomen. ?You develop redness or severe swelling of your penis. ?You have redness spreading at your groin or lower abdomen. ?You are unable to urinate. ?You experience chest pain or a rapid heartbeat (palpitations) after taking oral  medicines. ?These symptoms may represent a serious problem that is an emergency. Do not wait to see if the symptoms will go away. Get medical help right away. Call your local emergency services (911 in the [place] Do not drive yourself to the hospital. ?Summary ?Erectile dysfunction (ED) is the inability to get or keep an erection during sexual intercourse. ?This condition is diagnosed based on a physical exam, your symptoms, and tests to determine the cause. Treatment varies depending on the cause and may include medicines, hormone therapy, surgery, or a vacuum pump. ?You may need follow-up visits to make sure that you are using your medicines or devices correctly. ?Get help right away if you are taking or injecting medicines and you have an erection that lasts longer than 4 hours. ?This information is not intended to replace advice given to you by your health care provider. Make sure you discuss any questions you have with your health care provider. ?Document Revised: 05/17/2020 Document Reviewed: 05/17/2020 ?Elsevier Patient Education ? 2023 [employer] Inc[employer] ? ?

## 2021-12-20 ENCOUNTER — Telehealth: Payer: Self-pay

## 2021-12-20 NOTE — Telephone Encounter (Signed)
Patient calling in about 30 days samples for Myrebetriq due to his insurance where he is in a donut hold with his insurance..  Made patient  aware that I put samples up front for patient and he voiced he would come get them tomorrow. Patient voiced understanding

## 2022-01-23 ENCOUNTER — Other Ambulatory Visit: Payer: Self-pay | Admitting: Urology

## 2022-01-23 DIAGNOSIS — N3281 Overactive bladder: Secondary | ICD-10-CM

## 2022-02-01 ENCOUNTER — Ambulatory Visit
Admission: RE | Admit: 2022-02-01 | Discharge: 2022-02-01 | Disposition: A | Discharge status: Home / Self Care | Payer: Medicare Other | Source: Ambulatory Visit | Attending: Family | Admitting: Family

## 2022-02-01 ENCOUNTER — Other Ambulatory Visit: Payer: Self-pay | Admitting: Family

## 2022-02-01 DIAGNOSIS — M5440 Lumbago with sciatica, unspecified side: Secondary | ICD-10-CM

## 2022-02-04 ENCOUNTER — Other Ambulatory Visit: Payer: Self-pay | Admitting: Family

## 2022-02-04 DIAGNOSIS — M159 Polyosteoarthritis, unspecified: Secondary | ICD-10-CM

## 2022-02-07 ENCOUNTER — Ambulatory Visit
Admission: RE | Admit: 2022-02-07 | Discharge: 2022-02-07 | Disposition: A | Discharge status: Home / Self Care | Payer: Medicare Other | Source: Ambulatory Visit | Attending: Family | Admitting: Family

## 2022-02-07 DIAGNOSIS — M159 Polyosteoarthritis, unspecified: Secondary | ICD-10-CM

## 2022-02-18 ENCOUNTER — Ambulatory Visit (INDEPENDENT_AMBULATORY_CARE_PROVIDER_SITE_OTHER): Payer: Medicare Other

## 2022-02-18 ENCOUNTER — Ambulatory Visit (INDEPENDENT_AMBULATORY_CARE_PROVIDER_SITE_OTHER): Payer: Medicare Other | Admitting: Orthopedic Surgery

## 2022-02-18 ENCOUNTER — Encounter: Payer: Self-pay | Admitting: Orthopedic Surgery

## 2022-02-18 VITALS — BP 155/82 | HR 80 | Ht 67.0 in | Wt 228.0 lb

## 2022-02-18 DIAGNOSIS — M545 Low back pain, unspecified: Secondary | ICD-10-CM

## 2022-02-18 NOTE — Progress Notes (Signed)
Orthopedic Spine Surgery Office Note  Assessment: Patient is a 76 y.o. male with acute onset of low back pain.  Periodically gets numbness into the right anterior thigh.  No other radicular symptoms.  X-ray shows disc at loss at L4-5 and L5-S1.  He has facet arthropathy at those levels and LBP is worse with extension.  MRI shows right-sided paracentral disc herniation at L4-5 and foraminal stenosis at L4-5 bilaterally.   Plan: -Explained that initially conservative treatment is tried as a significant number of patients may experience relief with these treatment modalities. Discussed that the conservative treatments include:  -activity modification  -physical therapy  -over the counter pain medications  -medrol dosepak  -lumbar steroid injections -Patient has tried Tylenol -Recommended core strengthening and physical therapy.  A referral was provided to him today for physical therapy.  He should continue to work on core strengthening 3-4 times per week outside of physical therapy and when physical therapy ends. -He can continue with the 1000 mg Tylenol 3 times daily, he can continue with tai chi -Patient should return to office in 6 weeks, repeat x-rays of lumbar spine at next visit: None   Patient expressed understanding of the plan and all questions were answered to the patient's satisfaction.   ___________________________________________________________________________   History:  Patient is a 76 y.o. male who presents today for lumbar spine.  Patient had acute onset of low back pain approximately 3 to 4 weeks ago.  He states he was visiting his friend at [facility] who was in the hospital.  He was leaving when he noticed it.  Pain is felt in his lower back.  For the most part, it does not radiate anywhere.  He does periodically get right-sided anterior thigh numbness but that is not particularly bothersome to him.  He does not get any pain into the right lower extremity.  It is mainly  the back pain that bothers him.  He has not had back pain like this before.  In the last couple weeks, he has had trouble golfing or swimming or going to the gym because of the pain.  He has tried tai chi and that seemed to help.  He has been taking Tylenol 1000 mg 3 times daily and that helps him be more active.  There is no trauma or injury that brought on the pain.   Weakness: Feels weak in his low back, no other weakness noted Symptoms of imbalance: Denies Paresthesias and numbness: Yes, numbness into the right anterior thigh.  This resolves without any intervention.  No other numbness or paresthesias Bowel or bladder incontinence: Denies Saddle anesthesia: Denies  Treatments tried: Activity modification, Tylenol  Review of systems: Denies fevers and chills, night sweats, unexplained weight loss, history of cancer (chart review though shows history prostate cancer), pain that wakes them at night  Past medical history: Hypertension Depression Sleep apnea Bipolar Prostate cancer  Allergies: Lisinopril, hydrochlorothiazide, lactulose, quetiapine, Risperdal, Seroquel  Past surgical history:  Bilateral TKAs Radioactive seed implantation Left knee arthroscopy Tonsillectomy  Social history: Denies use of nicotine product (smoking, vaping, patches, smokeless) Alcohol use: Denies Denies recreational drug use   Physical Exam:  General: no acute distress, appears stated age Neurologic: alert, answering questions appropriately, following commands Respiratory: unlabored breathing on room air, symmetric chest rise Psychiatric: appropriate affect, normal cadence to speech   MSK (spine):  -Strength exam      Left  Right EHL    5/5  5/5 TA    5/5  5/5 GSC    5/5  5/5 Knee extension  5/5  5/5 Hip flexion   5/5  5/5  -Sensory exam    Sensation intact to light touch in L3-S1 nerve distributions of bilateral lower extremities  -Achilles DTR: 1/4 on the left, 1/4 on the  right -Patellar tendon DTR: 0/4 on the left, 0/4 on the right  -Straight leg raise: Negative -Contralateral straight leg raise: Negative -Femoral nerve stretch test: Negative bilaterally -Clonus: no beats bilaterally  -Left hip exam: No pain through range of motion, negative Stinchfield, negative Faber -Right hip exam: No pain through range of motion, negative Stinchfield, negative Faber  Imaging: XR of the lumbar spine from 02/18/2022 and 02/01/2022 was independently reviewed and interpreted, showing joint space narrowing in bilateral hips.  Disc height loss at L4-5, L5-S1, L2-3.  No fracture or dislocation.  No evidence of instability on flexion/extension. Facet arthropathy in his lower lumbar spine.   MRI of the lumbar spine from 02/07/2022 was independently reviewed and interpreted, showing right-sided disc paracentral disc herniation at L4-5.  There is foraminal stenosis at L4-5, worse on the right side.  DDD at L4-5 and L5-S1 with modic change at L5/S1.   Patient name: Paul Moon Patient MRN: [id number] Date of visit: 02/18/22

## 2022-02-21 ENCOUNTER — Telehealth: Payer: Self-pay | Admitting: Orthopedic Surgery

## 2022-02-21 NOTE — Telephone Encounter (Signed)
I called and advised that per Dr. Laurance Flatten he could play golf.

## 2022-02-21 NOTE — Telephone Encounter (Signed)
Pt asking for a call from from [place] asking if pt is allowed to play golf. Pt phone number is [phone]

## 2022-03-11 ENCOUNTER — Ambulatory Visit: Payer: Medicare HMO | Admitting: Urology

## 2022-03-11 ENCOUNTER — Telehealth: Payer: Self-pay | Admitting: Orthopedic Surgery

## 2022-03-11 VITALS — BP 159/69 | HR 79 | Wt 228.8 lb

## 2022-03-11 DIAGNOSIS — C61 Malignant neoplasm of prostate: Secondary | ICD-10-CM

## 2022-03-11 DIAGNOSIS — N5201 Erectile dysfunction due to arterial insufficiency: Secondary | ICD-10-CM | POA: Diagnosis not present

## 2022-03-11 DIAGNOSIS — N3281 Overactive bladder: Secondary | ICD-10-CM

## 2022-03-11 LAB — URINALYSIS, ROUTINE W REFLEX MICROSCOPIC
Bilirubin, UA: NEGATIVE
Glucose, UA: NEGATIVE
Ketones, UA: NEGATIVE
Leukocytes,UA: NEGATIVE
Nitrite, UA: NEGATIVE
Protein,UA: NEGATIVE
RBC, UA: NEGATIVE
Specific Gravity, UA: 1.02 (ref 1.005–1.030)
Urobilinogen, Ur: 0.2 mg/dL (ref 0.2–1.0)
pH, UA: 5.5 (ref 5.0–7.5)

## 2022-03-11 MED ORDER — MIRABEGRON ER 50 MG PO TB24: 50.0000 mg | ORAL_TABLET | Freq: Every day | ORAL | 3 refills | Status: DC

## 2022-03-11 MED ORDER — TAMSULOSIN HCL 0.4 MG PO CAPS: 0.4000 mg | ORAL_CAPSULE | Freq: Every day | ORAL | 3 refills | Status: DC

## 2022-03-11 NOTE — Progress Notes (Signed)
03/11/2022 10:45 AM   Paul Moon [birth year] [id number]  Referring provider: Sonia Side., FNP [place],  [place] [address]  Right side pain   HPI: Mr Casasola is a 77yo here for followup for prostate cancer, OAB, ED and new right side pain. He had intermittent right side pain 1-2 weeks ago which similar to the pain when he was diagnosed with prostate cancer. He is doing well on flomax and mirabegron. Urine stream strong. No straining to urinate. No other complaints today   PMH: Past Medical History:  Diagnosis Date   Arthritis    INDEX FINGER JOINT LEFT HAND, right knee   Bipolar disorder ([place])    Depression    History of gout 2018   right hand 2 fingers and right wrist-- 05-20-2017 per pt resolved   Hyperplasia of prostate with lower urinary tract symptoms (LUTS)    Hypertension    OSA on CPAP    per study 03-22-2013  moderate OSA w/ AHI 26.8/hr   Pinched cervical nerve root    PT STATES C 7-8 PINCHED NERVE CAUSING NUMBNESS MIDDLE, RING AND LITTLE FINGER LEFT HAND - NO PAIN - STATES HE IS TRYING TO AVOID SURGERY.   Pre-diabetes    followed by pcp   Prostate cancer [facility]) urologist-  dr Alyson Ingles  oncologist-  dr Tammi Klippel   dx 05-24-2016  Stage T1c, Gleason 4+3, PSA 5.49, vol 65.5cc/  pt seek second opinion's at [facility] and [facility] not candidate for focal laser ablation/  scheduled for radiactive seed implants 05-26-2017   Wears glasses     Surgical History: Past Surgical History:  Procedure Laterality Date   CIRCUMCISION  1973 approx.   KNEE ARTHROSCOPY Left 04/28/2013   Procedure: LEFT ARTHROSCOPY KNEE WITH DEBRIDEMENT meniscal debridement;  Surgeon: Gearlean Alf, MD;  Location: [facility];  Service: Orthopedics;  Laterality: Left;   KNEE SURGERY Right 2008   MR GUIDED PROSTATE BIOPSY  12-12-2016    [facility] [place], [place])   w/ general anesthesia   PROSTATE BIOPSY  05-29-2016   dr Alyson Ingles office   RADIOACTIVE SEED IMPLANT N/A 05/26/2017    Procedure: RADIOACTIVE SEED IMPLANT/BRACHYTHERAPY IMPLANT;  Surgeon: Cleon Gustin, MD;  Location: [facility];  Service: Urology;  Laterality: N/A;   SPACE OAR INSTILLATION N/A 05/26/2017   Procedure: SPACE OAR INSTILLATION;  Surgeon: Cleon Gustin, MD;  Location: [facility];  Service: Urology;  Laterality: N/A;   TONSILLECTOMY  1970s   TOTAL KNEE ARTHROPLASTY N/A 08/23/2019   Procedure: TOTAL KNEE ARTHROPLASTY;  Surgeon: Gaynelle Arabian, MD;  Location: [facility];  Service: Orthopedics;  Laterality: N/A;    Home Medications:  Allergies as of 03/11/2022       Reactions   Lisinopril Cough   Hctz [hydrochlorothiazide]    Unknown reaction   Lactose Intolerance (gi)    Quetiapine Fumarate Er Other (See Comments)   Risperdal [risperidone] Other (See Comments)   "not be lucid, be foggy"   Seroquel [quetiapine Fumarate] Other (See Comments)   Joint swelling   Seroquel [quetiapine] Other (See Comments)        Medication List        Accurate as of March 11, 2022 10:45 AM. If you have any questions, ask your nurse or doctor.          allopurinol 300 MG tablet Commonly known as: ZYLOPRIM Take 300 mg by mouth daily.   AMBULATORY NON FORMULARY MEDICATION 0.2 mLs  by Intracavernosal route as needed. Medication Name: Trimix  PGE 52mg Pap '30mg'$  Phent '1mg'$    amLODipine 10 MG tablet Commonly known as: NORVASC Take 10 mg by mouth daily.   amoxicillin 250 MG capsule Commonly known as: AMOXIL Take 1 capsule (250 mg total) by mouth 3 (three) times daily.   cyclobenzaprine 5 MG tablet Commonly known as: FLEXERIL Take 1 tablet (5 mg total) by mouth 3 (three) times daily as needed for muscle spasms.   donepezil 10 MG tablet Commonly known as: ARICEPT Take 1 tablet (10 mg total) by mouth at bedtime.   finasteride 5 MG tablet Commonly known as: PROSCAR TAKE 1 TABLET BY MOUTH  DAILY   losartan 100 MG tablet Commonly known as: COZAAR Take 100  mg by mouth at bedtime.   magnesium oxide 400 MG tablet Commonly known as: MAG-OX Take 400 mg by mouth daily.   memantine 10 MG tablet Commonly known as: NAMENDA Take 1 tablet (10 mg total) by mouth 2 (two) times daily. Must keep follow up 12/04/2020 for ongoing refills   Myrbetriq 50 MG Tb24 tablet Generic drug: mirabegron ER TAKE 1 TABLET BY MOUTH  DAILY   tadalafil 20 MG tablet Commonly known as: CIALIS Take 1 tablet (20 mg total) by mouth daily as needed for erectile dysfunction.   tamsulosin 0.4 MG Caps capsule Commonly known as: FLOMAX TAKE 1 CAPSULE BY MOUTH DAILY   traZODone 50 MG tablet Commonly known as: DESYREL Take 100 mg by mouth at bedtime.   vardenafil 20 MG tablet Commonly known as: LEVITRA Take 1 tablet (20 mg total) by mouth as needed for erectile dysfunction.        Allergies:  Allergies  Allergen Reactions   Lisinopril Cough   Hctz [Hydrochlorothiazide]     Unknown reaction   Lactose Intolerance (Gi)    Quetiapine Fumarate Er Other (See Comments)   Risperdal [Risperidone] Other (See Comments)    "not be lucid, be foggy"   Seroquel [Quetiapine Fumarate] Other (See Comments)    Joint swelling   Seroquel [Quetiapine] Other (See Comments)    Family History: Family History  Problem Relation Age of Onset   Renal cancer Father    Cancer Paternal Aunt        breast   Heart attack Paternal Grandmother    Heart attack Paternal Grandfather    Sleep apnea Neg Hx     Social History:  reports that he quit smoking about 36 years ago. His smoking use included cigarettes. He has a 30.00 pack-year smoking history. He has never used smokeless tobacco. He reports current alcohol use. He reports that he does not use drugs.  ROS: All other review of systems were reviewed and are negative except what is noted above in HPI  Physical Exam: BP (!) 159/69   Pulse 79   Wt 228 lb 12.8 oz (103.8 kg)   BMI 35.84 kg/m   Constitutional:  Alert and oriented,  No acute distress. HEENT: [place] AT, moist mucus membranes.  Trachea midline, no masses. Cardiovascular: No clubbing, cyanosis, or edema. Respiratory: Normal respiratory effort, no increased work of breathing. GI: Abdomen is soft, nontender, nondistended, no abdominal masses GU: No CVA tenderness.  Lymph: No cervical or inguinal lymphadenopathy. Skin: No rashes, bruises or suspicious lesions. Neurologic: Grossly intact, no focal deficits, moving all 4 extremities. Psychiatric: Normal mood and affect.  Laboratory Data: Lab Results  Component Value Date   WBC 8.1 08/24/2019   HGB 11.9 (L) 08/24/2019  HCT 35.6 (L) 08/24/2019   MCV 93.0 08/24/2019   PLT 248 08/24/2019    Lab Results  Component Value Date   CREATININE 1.70 (H) 10/31/2020    No results found for: "PSA"  Lab Results  Component Value Date   TESTOSTERONE 413 07/23/2021    No results found for: "HGBA1C"  Urinalysis    Component Value Date/Time   APPEARANCEUR Clear 07/23/2021 0859   GLUCOSEU Negative 07/23/2021 0859   BILIRUBINUR Negative 07/23/2021 0859   PROTEINUR 1+ (A) 07/23/2021 0859   NITRITE Negative 07/23/2021 0859   LEUKOCYTESUR Negative 07/23/2021 0859    Lab Results  Component Value Date   LABMICR See below: 07/23/2021   WBCUA None seen 07/23/2021   LABEPIT 0-10 07/23/2021   MUCUS Present 07/23/2021   BACTERIA None seen 07/23/2021    Pertinent Imaging:  No results found for this or any previous visit.  No results found for this or any previous visit.  No results found for this or any previous visit.  No results found for this or any previous visit.  No results found for this or any previous visit.  No valid procedures specified. No results found for this or any previous visit.  No results found for this or any previous visit.   Assessment & Plan:    1. Prostate cancer ([place]) -PSA today, if stable 6 months with PSA - Urinalysis, Routine w reflex microscopic  2. OAB (overactive  bladder) -continue mirabegron    No follow-ups on file.  [name], MD  [facility] [place]

## 2022-03-11 NOTE — Telephone Encounter (Signed)
[facility] P/T --[phone], needs order for P/T asap, he has an appt for tomorrow..Joylene Igo number is..[phone]

## 2022-03-11 NOTE — Patient Instructions (Signed)

## 2022-03-12 LAB — PSA: Prostate Specific Ag, Serum: 0.4 ng/mL (ref 0.0–4.0)

## 2022-03-12 NOTE — Telephone Encounter (Signed)
PT referral faxed this a.m.

## 2022-03-18 ENCOUNTER — Encounter: Payer: Self-pay | Admitting: Urology

## 2022-03-21 ENCOUNTER — Ambulatory Visit (INDEPENDENT_AMBULATORY_CARE_PROVIDER_SITE_OTHER): Payer: Medicare HMO | Admitting: Adult Health

## 2022-03-21 ENCOUNTER — Telehealth: Payer: Self-pay | Admitting: Adult Health

## 2022-03-21 ENCOUNTER — Encounter: Payer: Self-pay | Admitting: Adult Health

## 2022-03-21 VITALS — BP 159/74 | HR 80 | Ht 67.0 in | Wt 227.0 lb

## 2022-03-21 DIAGNOSIS — R413 Other amnesia: Secondary | ICD-10-CM

## 2022-03-21 DIAGNOSIS — G4733 Obstructive sleep apnea (adult) (pediatric): Secondary | ICD-10-CM

## 2022-03-21 DIAGNOSIS — R519 Headache, unspecified: Secondary | ICD-10-CM | POA: Diagnosis not present

## 2022-03-21 NOTE — Telephone Encounter (Signed)
[employer] medicare sent to [facility] they obtain auth [id number] 

## 2022-03-21 NOTE — Progress Notes (Signed)
PATIENT: Paul Moon DOB: 1945-08-10  REASON FOR VISIT: follow up HISTORY FROM: patient  Chief Complaint  Patient presents with   Follow-up    Pt in 4 Pt here for CPAP f/u  Pt states having issues with short term memory Pt states sharp pains on left side of head      HISTORY OF PRESENT ILLNESS: Today 03/21/22:  Paul Moon is a 77 y.o. male with a history of Memory Disturbance and OSA on CPAP. Returns today for follow-up. Reports that he was playing golf with someone he plays with reguarly but couldn't remember his name for about 3 hours but then he was able to recall it. He reports that 2 months ago started having pain left side of head orginally just at night when he would wake up but now feels it during the day. Short lived sharp pain 2/10 pain scale.  Starting to happen more frequently. Now happening about twice a week.  typically he is taking medication as events are very short  Reports that CPAP is working well for him.  Denies any new issues.  Download is below    5/22/2023[name] is a 77 year old male with a history of memory disturbance and obstructive sleep apnea on CPAP.  At the last visit he was instructed to restart CPAP. Since restarting energy level is better and BP is better. DL is better    04/26/21: Paul Moon is a 77 year old male with a history of memory disturbance.  He returns today for follow-up.  He reports that his memory has remained stable.  Reports that his memory has improved with Namenda.  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  No difficulty with maintaining his job.  Able to complete all ADLs independently.  Reports he has not been using his CPAP for over 6 months.  However after we discussed the risk associated with untreated sleep apnea he states that he will be restarting his CPAP.  Had trouble with his mask i which led him to stop using it.  Returns today for an evaluation  11/15/19: Paul Moon is a 77 year old male with a history of memory  disturbance.  He returns today for follow-up.  He reports overall he has been stable.  He is able to complete all ADLs independently.  He operates a Teacher, music without difficulty.  He manages his own finances.  He reports that he is semiretired.  Continues to work part-time.  He returns today for an evaluation.  HISTORY 11/24/18:   Paul Moon is a 77 year old male with a history of memory loss.  He returns today for follow-up.  He reports that his memory has been stable.  He is able to complete all ADLs independently.  He manages his own finances without difficulty.  He does not do any cooking but this has been the norm for him.  Denies any changes in his mood or behavior.  He states that his significant other feels that his memory is worse.  He states that if he forgets to get something at the grocery store she considers that a problem with his memory.  However he states that sometimes he is just preoccupied with other things and does not always process what she is asking.  He continues on Namenda and Aricept.  Tolerates these medications well.  Returns today for an evaluation.  REVIEW OF SYSTEMS: Out of a complete 14 system review of symptoms, the patient complains only of the following symptoms, and all other  reviewed systems are negative.  See HPI  ALLERGIES: Allergies  Allergen Reactions   Lisinopril Cough   Hctz [Hydrochlorothiazide]     Unknown reaction   Lactose Intolerance (Gi)    Quetiapine Fumarate Er Other (See Comments)   Risperdal [Risperidone] Other (See Comments)    "not be lucid, be foggy"   Seroquel [Quetiapine Fumarate] Other (See Comments)    Joint swelling   Seroquel [Quetiapine] Other (See Comments)    HOME MEDICATIONS: Outpatient Medications Prior to Visit  Medication Sig Dispense Refill   allopurinol (ZYLOPRIM) 300 MG tablet Take 300 mg by mouth daily.     AMBULATORY NON FORMULARY MEDICATION 0.2 mLs by Intracavernosal route as needed. Medication Name:  Trimix  PGE 25mg Pap '30mg'$  Phent '1mg'$  5 mL 5   amLODipine (NORVASC) 10 MG tablet Take 10 mg by mouth daily.     cyclobenzaprine (FLEXERIL) 5 MG tablet Take 1 tablet (5 mg total) by mouth 3 (three) times daily as needed for muscle spasms. 30 tablet 0   donepezil (ARICEPT) 10 MG tablet Take 1 tablet (10 mg total) by mouth at bedtime. 7 tablet 0   losartan (COZAAR) 100 MG tablet Take 100 mg by mouth at bedtime.      magnesium oxide (MAG-OX) 400 MG tablet Take 400 mg by mouth daily.     memantine (NAMENDA) 10 MG tablet Take 1 tablet (10 mg total) by mouth 2 (two) times daily. Must keep follow up 12/04/2020 for ongoing refills 180 tablet 1   mirabegron ER (MYRBETRIQ) 50 MG TB24 tablet Take 1 tablet (50 mg total) by mouth daily. 90 tablet 3   tamsulosin (FLOMAX) 0.4 MG CAPS capsule Take 1 capsule (0.4 mg total) by mouth daily. 90 capsule 3   traZODone (DESYREL) 50 MG tablet Take 100 mg by mouth at bedtime.     amoxicillin (AMOXIL) 250 MG capsule Take 1 capsule (250 mg total) by mouth 3 (three) times daily. 21 capsule 0   finasteride (PROSCAR) 5 MG tablet TAKE 1 TABLET BY MOUTH  DAILY 90 tablet 3   tadalafil (CIALIS) 20 MG tablet Take 1 tablet (20 mg total) by mouth daily as needed for erectile dysfunction. 10 tablet 5   vardenafil (LEVITRA) 20 MG tablet Take 1 tablet (20 mg total) by mouth as needed for erectile dysfunction. 10 tablet 5   No facility-administered medications prior to visit.    PAST MEDICAL HISTORY: Past Medical History:  Diagnosis Date   Arthritis    INDEX FINGER JOINT LEFT HAND, right knee   [place]    Depression    History of gout 2018   right hand 2 fingers and right wrist-- 05-20-2017 per pt resolved   Hyperplasia of prostate with lower urinary tract symptoms (LUTS)    Hypertension    OSA on CPAP    per study 03-22-2013  moderate OSA w/ AHI 26.8/hr   Pinched cervical nerve root    PT STATES C 7-8 PINCHED NERVE CAUSING NUMBNESS MIDDLE, RING AND LITTLE  FINGER LEFT HAND - NO PAIN - STATES HE IS TRYING TO AVOID SURGERY.   Pre-diabetes    followed by pcp   Prostate cancer ([facility] urologist-  dr mAlyson Ingles oncologist-  dr mTammi Klippel  dx 05-24-2016  Stage T1c, Gleason 4+3, PSA 5.49, vol 65.5cc/  [facility]/  scheduled for radiactive seed implants 05-26-2017   Wears glasses     PAST  SURGICAL HISTORY: Past Surgical History:  Procedure Laterality Date   CIRCUMCISION  1973 approx.   KNEE ARTHROSCOPY Left 04/28/2013   Procedure: LEFT ARTHROSCOPY KNEE WITH DEBRIDEMENT meniscal debridement;  Surgeon: Gearlean Alf, MD;  Location: [facility];  Service: Orthopedics;  Laterality: Left;   KNEE SURGERY Right 2008   Paul GUIDED PROSTATE BIOPSY  12-12-2016    [facility] [place])   w/ general anesthesia   PROSTATE BIOPSY  05-29-2016   dr Alyson Ingles office   RADIOACTIVE SEED IMPLANT N/A 05/26/2017   Procedure: RADIOACTIVE SEED IMPLANT/BRACHYTHERAPY IMPLANT;  Surgeon: Cleon Gustin, MD;  Location: [facility];  Service: Urology;  Laterality: N/A;   SPACE OAR INSTILLATION N/A 05/26/2017   Procedure: SPACE OAR INSTILLATION;  Surgeon: Cleon Gustin, MD;  [facility];  Service: Urology;  Laterality: N/A;   TONSILLECTOMY  1970s   TOTAL KNEE ARTHROPLASTY N/A 08/23/2019   Procedure: TOTAL KNEE ARTHROPLASTY;  Surgeon: Gaynelle Arabian, MD;  Location: [facility];  Service: Orthopedics;  Laterality: N/A;    FAMILY HISTORY: Family History  Problem Relation Age of Onset   Renal cancer Father    Cancer Paternal Aunt        breast   Heart attack Paternal Grandmother    Heart attack Paternal Grandfather    Sleep apnea Neg Hx     SOCIAL HISTORY: Social History   Socioeconomic History   Marital status: Divorced    Spouse name: Not on file   Number of children: 2   Years of education: College   Highest education level: Not on file   Occupational History   Occupation: Semi-Retired  Tobacco Use   Smoking status: Former    Packs/day: 1.50    Years: 20.00    Total pack years: 30.00    Types: Cigarettes    Quit date: 05/26/1985    Years since quitting: 36.8   Smokeless tobacco: Never  Vaping Use   Vaping Use: Never used  Substance and Sexual Activity   Alcohol use: Yes    Comment: occasional wine   Drug use: No   Sexual activity: Yes  Other Topics Concern   Not on file  Social History Narrative   Patient lives at home alone.   Caffeine Use: occasionally   Social Determinants of Health   Financial Resource Strain: Not on file  Food Insecurity: Not on file  Transportation Needs: Not on file  Physical Activity: Not on file  Stress: Not on file  Social Connections: Not on file  Intimate Partner Violence: Not on file      PHYSICAL EXAM  Vitals:   03/21/22 1443  BP: (!) 159/74  Pulse: 80  Weight: 227 lb (103 kg)  Height: '5\' 7"'$  (1.702 m)     Body mass index is 35.55 kg/m.      03/21/2022    2:46 PM 11/15/2019    9:01 AM 11/24/2018    8:22 AM  MMSE - Mini Mental State Exam  Orientation to time '5 5 5  '$ Orientation to Place '5 5 5  '$ Registration '3 3 3  '$ Attention/ Calculation '5 5 5  '$ Recall '3 3 3  '$ Language- name 2 objects '2 2 2  '$ Language- repeat '1 1 1  '$ Language- follow 3 step command '3 3 3  '$ Language- read & follow direction '1 1 1  '$ Write a sentence '1 1 1  '$ Copy design 0 1 1  Total score 29 30 30  Generalized: Well developed, in no acute distress   Neurological examination  Mentation: Alert oriented to time, place, history taking. Follows all commands speech and language fluent Cranial nerve II-XII: Pupils were equal round reactive to light. Extraocular movements were full, visual field were full on confrontational test.  Head turning and shoulder shrug  were normal and symmetric. Motor: The motor testing reveals 5 over 5 strength of all 4 extremities. Good symmetric motor tone is noted  throughout.  Sensory: Sensory testing is intact to soft touch on all 4 extremities. No evidence of extinction is noted.  Coordination: Cerebellar testing reveals good finger-nose-finger and heel-to-shin bilaterally.  Gait and station: Gait is normal.     DIAGNOSTIC DATA (LABS, IMAGING, TESTING) - I reviewed patient records, labs, notes, testing and imaging myself where available.  Lab Results  Component Value Date   WBC 8.1 08/24/2019   HGB 11.9 (L) 08/24/2019   HCT 35.6 (L) 08/24/2019   MCV 93.0 08/24/2019   PLT 248 08/24/2019      Component Value Date/Time   NA 134 (L) 08/24/2019 0301   K 4.4 08/24/2019 0301   CL 101 08/24/2019 0301   CO2 24 08/24/2019 0301   GLUCOSE 150 (H) 08/24/2019 0301   BUN 13 08/24/2019 0301   CREATININE 1.70 (H) 10/31/2020 1654   CALCIUM 8.6 (L) 08/24/2019 0301   PROT 7.4 08/16/2019 0844   ALBUMIN 4.0 08/16/2019 0844   AST 23 08/16/2019 0844   ALT 23 08/16/2019 0844   ALKPHOS 73 08/16/2019 0844   BILITOT 0.7 08/16/2019 0844   GFRNONAA 58 (L) 08/24/2019 0301   GFRAA >60 08/24/2019 0301    Lab Results  Component Value Date   TSH 2.620 03/22/2013      ASSESSMENT AND PLAN 77 y.o. year old male  has a past medical history of Arthritis, Bipolar disorder ([place]), Depression, History of gout (2018), Hyperplasia of prostate with lower urinary tract symptoms (LUTS), Hypertension, OSA on CPAP, Pinched cervical nerve root, Pre-diabetes, Prostate cancer [facility]) (urologist-  dr Alyson Ingles  oncologist-  dr Tammi Klippel), and Wears glasses. here with:  1.  Memory disturbance  Continue Aricept 10 mg at bedtime Continue Namenda '10mg'$  BID   2.  Obstructive sleep apnea on CPAP  Good Compliance Residual AHI in normal range Encourage patient to continue using CPAP nightly and greater than 4 hours each night   3.  New onset headaches  Physical exam is relatively unremarkable. Patient is requesting imaging.  Will do the MRI of the brain without  contrast    FU in 6 months or sooner if needed  Ward Givens, MSN, NP-C 03/21/2022, 2:43 PM [facility] [address], [place], [place] [address] [phone]

## 2022-03-21 NOTE — Patient Instructions (Signed)
Your Plan:  Continue Aricept and namenda  MRI brain wo contrast If your symptoms worsen or you develop new symptoms please let us know.    Thank you for coming to see [place] at [facility]. I hope we have been able to provide you high quality care today.  You may receive a patient satisfaction survey over the next few weeks. We would appreciate your feedback and comments so that we may continue to improve ourselves and the health of our patients.

## 2022-03-29 ENCOUNTER — Ambulatory Visit: Payer: Medicare HMO | Admitting: Orthopedic Surgery

## 2022-03-29 DIAGNOSIS — M5136 Other intervertebral disc degeneration, lumbar region: Secondary | ICD-10-CM

## 2022-03-29 NOTE — Progress Notes (Signed)
Orthopedic Spine Surgery Office Note  Assessment: Patient is a 77 y.o. male with acute onset of low back pain.  Periodically gets numbness into the right anterior thigh.  No other radicular symptoms.  X-ray shows disc at loss at L4-5 and L5-S1.  He has facet arthropathy at those levels and LBP is worse with extension.  MRI shows right-sided paracentral disc herniation at L4-5 and foraminal stenosis at L4-5 bilaterally.    Plan: -Patient has had significant improvement with conservative treatment and his symptoms are tolerable at this point -Patient has tried activity modification, tylenol, core strengthening, PT -Recommended he continue with the core strengthening and exercises prescribed by PT. Told him to keep them up even after completing the PT as that may cause the pain to return -Encouraged him to continue to work on weight loss to offload the lumbar spine -Patient should return to office on an as needed basis   Patient expressed understanding of the plan and all questions were answered to the patient's satisfaction.   ___________________________________________________________________________  History: Patient is a 77 y.o. male who has been previously seen in the office for symptoms of low back pain and periodic right anterior thigh numbness. Since the last visit, his symptoms have significantly improved. He rates his pain as a 1 or 2 out of 10. Feels it in his low back. The right anterior thigh numbness has resolved. Was able to play 18 holes of golf recently. No radiating leg pain. Denies paresthesias and numbness.   Previous treatments: activity modification, tylenol, core strengthening, PT  COPY OF FIRST NOTE Patient is a 77 y.o. male who presents today for lumbar spine.  Patient had acute onset of low back pain approximately 3 to 4 weeks ago.  He states he was visiting his friend at [facility] who was in the hospital.  He was leaving when he noticed it.  Pain is felt in his lower  back.  For the most part, it does not radiate anywhere.  He does periodically get right-sided anterior thigh numbness but that is not particularly bothersome to him.  He does not get any pain into the right lower extremity.  It is mainly the back pain that bothers him.  He has not had back pain like this before.  In the last couple weeks, he has had trouble golfing or swimming or going to the gym because of the pain.  He has tried tai chi and that seemed to help.  He has been taking Tylenol 1000 mg 3 times daily and that helps him be more active.  There is no trauma or injury that brought on the pain.     Weakness: Feels weak in his low back, no other weakness noted Symptoms of imbalance: Denies Paresthesias and numbness: Yes, numbness into the right anterior thigh.  This resolves without any intervention.  No other numbness or paresthesias Bowel or bladder incontinence: Denies Saddle anesthesia: Denies END OF COPY  Physical Exam:  General: no acute distress, appears stated age Neurologic: alert, answering questions appropriately, following commands Respiratory: unlabored breathing on room air, symmetric chest rise Psychiatric: appropriate affect, normal cadence to speech   MSK (spine):  -Strength exam      Left  Right EHL    5/5  5/5 TA    5/5  5/5 GSC    5/5  5/5 Knee extension  5/5  5/5 Hip flexion   5/5  5/5  -Sensory exam    Sensation intact to light touch in  L3-S1 nerve distributions of bilateral lower extremities  -Achilles DTR: 1/4 on the left, 1/4 on the right -Patellar tendon DTR: 0/4 on the left, 0/4 on the right   -Straight leg raise: Negative -Contralateral straight leg raise: Negative -Femoral nerve stretch test: Negative bilaterally -Clonus: no beats bilaterally   -Left hip exam: No pain through range of motion, negative Stinchfield, negative Faber -Right hip exam: No pain through range of motion, negative Stinchfield, negative Faber  Imaging: XR of the  lumbar spine from 02/18/2022 and 02/01/2022 was previously independently reviewed and interpreted, showing joint space narrowing in bilateral hips.  Disc height loss at L4-5, L5-S1, L2-3.  No fracture or dislocation.  No evidence of instability on flexion/extension. Facet arthropathy in his lower lumbar spine.    MRI of the lumbar spine from 02/07/2022 was previously independently reviewed and interpreted, showing right-sided disc paracentral disc herniation at L4-5.  There is foraminal stenosis at L4-5, worse on the right side.  DDD at L4-5 and L5-S1 with modic change at L5/S1.   Patient name: Paul Moon Patient MRN: [id number] Date of visit: 03/29/22

## 2022-04-02 ENCOUNTER — Encounter: Payer: Self-pay | Admitting: Adult Health

## 2022-04-03 ENCOUNTER — Ambulatory Visit
Admission: RE | Admit: 2022-04-03 | Discharge: 2022-04-03 | Disposition: A | Discharge status: Home / Self Care | Payer: Medicare HMO | Source: Ambulatory Visit | Attending: Adult Health | Admitting: Adult Health

## 2022-04-03 DIAGNOSIS — R519 Headache, unspecified: Secondary | ICD-10-CM

## 2022-04-09 ENCOUNTER — Ambulatory Visit: Payer: Medicare Other | Admitting: Adult Health

## 2022-04-30 ENCOUNTER — Ambulatory Visit: Payer: Medicare Other | Admitting: Adult Health

## 2022-07-17 ENCOUNTER — Ambulatory Visit: Payer: Medicare Other | Admitting: Urology

## 2022-07-24 ENCOUNTER — Ambulatory Visit: Payer: Medicare Other | Admitting: Adult Health

## 2022-08-28 ENCOUNTER — Other Ambulatory Visit: Payer: Medicare HMO

## 2022-09-04 ENCOUNTER — Ambulatory Visit: Payer: Medicare HMO | Admitting: Urology

## 2022-09-04 DIAGNOSIS — C61 Malignant neoplasm of prostate: Secondary | ICD-10-CM

## 2022-09-21 ENCOUNTER — Telehealth: Payer: Self-pay

## 2022-09-21 NOTE — Telephone Encounter (Signed)
Pharmacy sent via fax Insurance does not cover generic myrbetriq  Plan will cover myrbetriq or Gemtesa. Message sent to provider for alternative.

## 2022-09-27 NOTE — Telephone Encounter (Signed)
Please see PA request

## 2022-10-02 ENCOUNTER — Ambulatory Visit: Payer: Medicare HMO | Admitting: Adult Health

## 2022-10-02 ENCOUNTER — Encounter: Payer: Self-pay | Admitting: Adult Health

## 2022-10-02 VITALS — BP 141/80 | HR 75 | Ht 69.6 in | Wt 223.0 lb

## 2022-10-02 DIAGNOSIS — R413 Other amnesia: Secondary | ICD-10-CM | POA: Diagnosis not present

## 2022-10-02 DIAGNOSIS — G4733 Obstructive sleep apnea (adult) (pediatric): Secondary | ICD-10-CM

## 2022-10-02 NOTE — Progress Notes (Signed)
PATIENT: Paul Moon DOB: 07-09-1945  REASON FOR VISIT: follow up HISTORY FROM: patient  Chief Complaint  Patient presents with   Memory Loss    Rm 5 alone Pt is well, reports his CPAP humidification is not working properly. He is interested in a new machine. Feels his memory has slightly decided since last visit Headaches are stable.       HISTORY OF PRESENT ILLNESS: Today 10/02/22:  Paul Moon is a 77 y.o. male with a history of memory disturbance and OSA on CPAP. Returns today for follow-up.   OSA on CPAP: working well but humidification is not working. Setup date was in 2015. Would like a new machine.  He states that once he gets up to go to the bathroom he usually takes the machine off.  May sleep another 1-1/2 hours before he gets up.  Memory: has trouble focusing which affects his ability to get things done. This seemed to occur when he was not taking trazodone. He is back on his regular med schedule and now things seem to be better.  He remains on Aricept and Namenda.     03/21/22: Paul Moon is a 77 y.o. male with a history of Memory Disturbance and OSA on CPAP. Returns today for follow-up. Reports that he was playing golf with someone he plays with reguarly but couldn't remember his name for about 3 hours but then he was able to recall it. He reports that 2 months ago started having pain left side of head orginally just at night when he would wake up but now feels it during the day. Short lived sharp pain 2/10 pain scale.  Starting to happen more frequently. Now happening about twice a week.  typically he is taking medication as events are very short  Reports that CPAP is working well for him.  Denies any new issues.  Download is below    5/22/2023[name] is a 78 year old male with a history of memory disturbance and obstructive sleep apnea on CPAP.  At the last visit he was instructed to restart CPAP. Since restarting energy level is better and BP is  better. DL is better    1/61/09: Paul Moon is a 77 year old male with a history of memory disturbance.  He returns today for follow-up.  He reports that his memory has remained stable.  Reports that his memory has improved with Namenda.  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  No difficulty with maintaining his job.  Able to complete all ADLs independently.  Reports he has not been using his CPAP for over 6 months.  However after we discussed the risk associated with untreated sleep apnea he states that he will be restarting his CPAP.  Had trouble with his mask i which led him to stop using it.  Returns today for an evaluation  11/15/19: Paul Moon is a 77 year old male with a history of memory disturbance.  He returns today for follow-up.  He reports overall he has been stable.  He is able to complete all ADLs independently.  He operates a Librarian, academic without difficulty.  He manages his own finances.  He reports that he is semiretired.  Continues to work part-time.  He returns today for an evaluation.  HISTORY 11/24/18:   Paul Moon is a 77 year old male with a history of memory loss.  He returns today for follow-up.  He reports that his memory has been stable.  He is able to complete all ADLs  independently.  He manages his own finances without difficulty.  He does not do any cooking but this has been the norm for him.  Denies any changes in his mood or behavior.  He states that his significant other feels that his memory is worse.  He states that if he forgets to get something at the grocery store she considers that a problem with his memory.  However he states that sometimes he is just preoccupied with other things and does not always process what she is asking.  He continues on Namenda and Aricept.  Tolerates these medications well.  Returns today for an evaluation.  REVIEW OF SYSTEMS: Out of a complete 14 system review of symptoms, the patient complains only of the following symptoms, and all other  reviewed systems are negative.  See HPI  ALLERGIES: Allergies  Allergen Reactions   Lisinopril Cough   Hctz [Hydrochlorothiazide]     Unknown reaction   Lactose Intolerance (Gi)    Quetiapine Fumarate Er Other (See Comments)   Risperdal [Risperidone] Other (See Comments)    "not be lucid, be foggy"   Seroquel [Quetiapine Fumarate] Other (See Comments)    Joint swelling   Seroquel [Quetiapine] Other (See Comments)    HOME MEDICATIONS: Outpatient Medications Prior to Visit  Medication Sig Dispense Refill   allopurinol (ZYLOPRIM) 300 MG tablet Take 300 mg by mouth daily.     AMBULATORY NON FORMULARY MEDICATION 0.2 mLs by Intracavernosal route as needed. Medication Name: Trimix  PGE Pap 30mg  Phent 1mg  5 mL 5   amLODipine (NORVASC) 10 MG tablet Take 10 mg by mouth daily.     amoxicillin (AMOXIL) 250 MG capsule Take 1 capsule (250 mg total) by mouth 3 (three) times daily. 21 capsule 0   cyclobenzaprine (FLEXERIL) 5 MG tablet Take 1 tablet (5 mg total) by mouth 3 (three) times daily as needed for muscle spasms. 30 tablet 0   donepezil (ARICEPT) 10 MG tablet Take 1 tablet (10 mg total) by mouth at bedtime. 7 tablet 0   finasteride (PROSCAR) 5 MG tablet TAKE 1 TABLET BY MOUTH  DAILY 90 tablet 3   losartan (COZAAR) 100 MG tablet Take 100 mg by mouth at bedtime.      magnesium oxide (MAG-OX) 400 MG tablet Take 400 mg by mouth daily.     memantine (NAMENDA) 10 MG tablet Take 1 tablet (10 mg total) by mouth 2 (two) times daily. Must keep follow up 12/04/2020 for ongoing refills 180 tablet 1   mirabegron ER (MYRBETRIQ) 50 MG TB24 tablet Take 1 tablet (50 mg total) by mouth daily. 90 tablet 3   tadalafil (CIALIS) 20 MG tablet Take 1 tablet (20 mg total) by mouth daily as needed for erectile dysfunction. 10 tablet 5   tamsulosin (FLOMAX) 0.4 MG CAPS capsule Take 1 capsule (0.4 mg total) by mouth daily. 90 capsule 3   traZODone (DESYREL) 50 MG tablet Take 100 mg by mouth at bedtime.      vardenafil (LEVITRA) 20 MG tablet Take 1 tablet (20 mg total) by mouth as needed for erectile dysfunction. 10 tablet 5   No facility-administered medications prior to visit.    PAST MEDICAL HISTORY: Past Medical History:  Diagnosis Date   Arthritis    INDEX FINGER JOINT LEFT HAND, right knee   Bipolar disorder (HCC)    Depression    History of gout 2018   right hand 2 fingers and right wrist-- 05-20-2017 per pt resolved   Hyperplasia of  prostate with lower urinary tract symptoms (LUTS)    Hypertension    OSA on CPAP    per study 03-22-2013  moderate OSA w/ AHI 26.8/hr   Pinched cervical nerve root    PT STATES C 7-8 PINCHED NERVE CAUSING NUMBNESS MIDDLE, RING AND LITTLE FINGER LEFT HAND - NO PAIN - STATES HE IS TRYING TO AVOID SURGERY.   Pre-diabetes    followed by pcp   Prostate cancer [facility]) urologist-  dr Ronne Binning  oncologist-  dr Kathrynn Running   dx 05-24-2016  Stage T1c, Gleason 4+3, PSA 5.49, vol 65.5cc/  pt seek second opinion's at [facility] and [facility] not candidate for focal laser ablation/  scheduled for radiactive seed implants 05-26-2017   Wears glasses     PAST SURGICAL HISTORY: Past Surgical History:  Procedure Laterality Date   CIRCUMCISION  1973 approx.   KNEE ARTHROSCOPY Left 04/28/2013   Procedure: LEFT ARTHROSCOPY KNEE WITH DEBRIDEMENT meniscal debridement;  Surgeon: Loanne Drilling, MD;  Location: [facility];  Service: Orthopedics;  Laterality: Left;   KNEE SURGERY Right 2008   left knee replacment      2023   Paul GUIDED PROSTATE BIOPSY  12-12-2016    [facility] [place], [place])   w/ general anesthesia   PROSTATE BIOPSY  05-29-2016   dr Ronne Binning office   RADIOACTIVE SEED IMPLANT N/A 05/26/2017   Procedure: RADIOACTIVE SEED IMPLANT/BRACHYTHERAPY IMPLANT;  Surgeon: Malen Gauze, MD;  Location: [facility];  Service: Urology;  Laterality: N/A;   SPACE OAR INSTILLATION N/A 05/26/2017   Procedure: SPACE OAR INSTILLATION;  Surgeon: Malen Gauze,  MD;  Location: [facility];  Service: Urology;  Laterality: N/A;   TONSILLECTOMY  1970s   TOTAL KNEE ARTHROPLASTY N/A 08/23/2019   Procedure: TOTAL KNEE ARTHROPLASTY;  Surgeon: Ollen Gross, MD;  Location: [facility];  Service: Orthopedics;  Laterality: N/A;    FAMILY HISTORY: Family History  Problem Relation Age of Onset   Renal cancer Father    Cancer Paternal Aunt        breast   Heart attack Paternal Grandmother    Heart attack Paternal Grandfather    Sleep apnea Neg Hx     SOCIAL HISTORY: Social History   Socioeconomic History   Marital status: Divorced    Spouse name: Not on file   Number of children: 2   Years of education: College   Highest education level: Not on file  Occupational History   Occupation: Semi-Retired  Tobacco Use   Smoking status: Former    Current packs/day: 0.00    Average packs/day: 1.5 packs/day for 20.0 years (30.0 ttl pk-yrs)    Types: Cigarettes    Start date: 05/26/1965    Quit date: 05/26/1985    Years since quitting: 37.3   Smokeless tobacco: Never  Vaping Use   Vaping status: Never Used  Substance and Sexual Activity   Alcohol use: Yes    Comment: occasional wine   Drug use: No   Sexual activity: Yes  Other Topics Concern   Not on file  Social History Narrative   Patient lives at home alone.   Caffeine Use: occasionally   Social Determinants of Health   Financial Resource Strain: Not on file  Food Insecurity: Not on file  Transportation Needs: Not on file  Physical Activity: Not on file  Stress: Not on file  Social Connections: Not on file  Intimate Partner Violence: Not on file      PHYSICAL  EXAM  Vitals:   10/02/22 1024  BP: (!) 141/80  Pulse: 75  Weight: 223 lb (101.2 kg)  Height: 5' 9.6" (1.768 m)      Body mass index is 32.37 kg/m.      10/02/2022   10:30 AM 03/21/2022    2:46 PM 11/15/2019    9:01 AM  MMSE - Mini Mental State Exam  Orientation to time 5 5 5   Orientation to Place 5 5  5   Registration 3 3 3   Attention/ Calculation 5 5 5   Recall 3 3 3   Language- name 2 objects 2 2 2   Language- repeat 1 1 1   Language- follow 3 step command 3 3 3   Language- read & follow direction 1 1 1   Write a sentence 1 1 1   Copy design 1 0 1  Total score 30 29 30      Generalized: Well developed, in no acute distress   Neurological examination  Mentation: Alert oriented to time, place, history taking. Follows all commands speech and language fluent Cranial nerve II-XII: Facial symmetry noted Gait and station: Gait is normal.     DIAGNOSTIC DATA (LABS, IMAGING, TESTING) - I reviewed patient records, labs, notes, testing and imaging myself where available.  Lab Results  Component Value Date   WBC 8.1 08/24/2019   HGB 11.9 (L) 08/24/2019   HCT 35.6 (L) 08/24/2019   MCV 93.0 08/24/2019   PLT 248 08/24/2019      Component Value Date/Time   NA 134 (L) 08/24/2019 0301   K 4.4 08/24/2019 0301   CL 101 08/24/2019 0301   CO2 24 08/24/2019 0301   GLUCOSE 150 (H) 08/24/2019 0301   BUN 13 08/24/2019 0301   CREATININE 1.70 (H) 10/31/2020 1654   CALCIUM 8.6 (L) 08/24/2019 0301   PROT 7.4 08/16/2019 0844   ALBUMIN 4.0 08/16/2019 0844   AST 23 08/16/2019 0844   ALT 23 08/16/2019 0844   ALKPHOS 73 08/16/2019 0844   BILITOT 0.7 08/16/2019 0844   GFRNONAA 58 (L) 08/24/2019 0301   GFRAA >60 08/24/2019 0301    Lab Results  Component Value Date   TSH 2.620 03/22/2013      ASSESSMENT AND PLAN 76 y.o. year old male  has a past medical history of Arthritis, Bipolar disorder (HCC), Depression, History of gout (2018), Hyperplasia of prostate with lower urinary tract symptoms (LUTS), Hypertension, OSA on CPAP, Pinched cervical nerve root, Pre-diabetes, Prostate cancer [facility]) (urologist-  dr Ronne Binning  oncologist-  dr Kathrynn Running), and Wears glasses. here with:  1.  Memory disturbance  Continue Aricept 10 mg at bedtime Continue Namenda 10mg  BID MMSE 30/30   2.  Obstructive sleep  apnea on CPAP  Good Compliance Residual AHI in normal range Encourage patient to continue using CPAP nightly and greater than 4 hours each night Home sleep test ordered pending results we will order new machine      FU in 6 months or sooner if needed  Butch Penny, MSN, NP-C 10/02/2022, 10:11 AM [facility] [address], [address] [place], [place] [address] [phone]

## 2022-10-02 NOTE — Patient Instructions (Signed)
Continue using CPAP nightly and greater than 4 hours each night Continue Aricept and namenda If your symptoms worsen or you develop new symptoms please let us know.

## 2022-10-03 NOTE — Telephone Encounter (Signed)
Per drug change request form the preferred alternative is Myrbetriq. Mrybetruiq 50mg  po every day sent in via fax form per Dr. Ronne Binning.

## 2022-10-14 ENCOUNTER — Telehealth: Payer: Self-pay

## 2022-10-14 NOTE — Telephone Encounter (Signed)
Patient is requesting someone reach out to [facility] regarding his trimix rx.  There is some confusion on filling the prescription.

## 2022-10-15 NOTE — Telephone Encounter (Signed)
Trimix rx has not been refilled since 01/2021. Ok to send refill?

## 2022-10-15 NOTE — Telephone Encounter (Signed)
I spoke with Tamela Oddi at [facility].  She states that she has contacted our office and they have sent over refill request to have patient's trimix rx refilled.  I confirmed they had the right fax number and confirmed refill request will be forwarded to MD and Nurse.

## 2022-10-18 NOTE — Telephone Encounter (Signed)
Pharmacist Bettise from [facility] is made aware prescription has been filled since 2022 and a message has been sent to provider. Bettise voiced understanding

## 2022-10-23 ENCOUNTER — Ambulatory Visit: Payer: Medicare HMO | Admitting: Urology

## 2022-10-23 VITALS — BP 146/77 | HR 79

## 2022-10-23 DIAGNOSIS — C61 Malignant neoplasm of prostate: Secondary | ICD-10-CM

## 2022-10-23 DIAGNOSIS — N3281 Overactive bladder: Secondary | ICD-10-CM

## 2022-10-23 DIAGNOSIS — N5201 Erectile dysfunction due to arterial insufficiency: Secondary | ICD-10-CM | POA: Diagnosis not present

## 2022-10-23 MED ORDER — TAMSULOSIN HCL 0.4 MG PO CAPS: 0.4000 mg | ORAL_CAPSULE | Freq: Every day | ORAL | 3 refills | Status: DC

## 2022-10-23 MED ORDER — AMBULATORY NON FORMULARY MEDICATION: 0.2000 mL | 5 refills | Status: DC | PRN

## 2022-10-23 MED ORDER — FINASTERIDE 5 MG PO TABS: 5.0000 mg | ORAL_TABLET | Freq: Every day | ORAL | 3 refills | Status: DC

## 2022-10-23 MED ORDER — MIRABEGRON ER 50 MG PO TB24: 50.0000 mg | ORAL_TABLET | Freq: Every day | ORAL | 3 refills | Status: DC

## 2022-10-23 NOTE — Progress Notes (Signed)
10/23/2022 12:19 PM   Paul Moon [birth year] [id number]  Referring provider: Raymon Mutton., FNP [address] [place],  [place] [address]  Followup prostate cancer and BPH   HPI: [name] is a 77yo here for followup for prostate cancer, OAB and BPH. IPSS 4 QOL 2. PSA 0.4 seven months ago. No recent PSA. Urine stream strong. No straining to urinate. He has stable urinary urgency and frequency on mirabegron. He uses trimix prn with good results   PMH: Past Medical History:  Diagnosis Date   Arthritis    INDEX FINGER JOINT LEFT HAND, right knee   Bipolar disorder (HCC)    Depression    History of gout 2018   right hand 2 fingers and right wrist-- 05-20-2017 per pt resolved   Hyperplasia of prostate with lower urinary tract symptoms (LUTS)    Hypertension    OSA on CPAP    per study 03-22-2013  moderate OSA w/ AHI 26.8/hr   Pinched cervical nerve root    PT STATES C 7-8 PINCHED NERVE CAUSING NUMBNESS MIDDLE, RING AND LITTLE FINGER LEFT HAND - NO PAIN - STATES HE IS TRYING TO AVOID SURGERY.   Pre-diabetes    followed by pcp   Prostate cancer [facility]) urologist-  dr Ronne Binning  oncologist-  dr Kathrynn Running   dx 05-24-2016  Stage T1c, Gleason 4+3, PSA 5.49, vol 65.5cc/  pt seek second opinion's at [facility] not candidate for focal laser ablation/  scheduled for radiactive seed implants 05-26-2017   Wears glasses     Surgical History: Past Surgical History:  Procedure Laterality Date   CIRCUMCISION  1973 approx.   KNEE ARTHROSCOPY Left 04/28/2013   Procedure: LEFT ARTHROSCOPY KNEE WITH DEBRIDEMENT meniscal debridement;  Surgeon: Loanne Drilling, MD;  Location: [facility];  Service: Orthopedics;  Laterality: Left;   KNEE SURGERY Right 2008   left knee replacment      2023   MR GUIDED PROSTATE BIOPSY  12-12-2016    [facility] [place], [place])   w/ general anesthesia   PROSTATE BIOPSY  05-29-2016   dr Ronne Binning office   RADIOACTIVE SEED IMPLANT N/A 05/26/2017    Procedure: RADIOACTIVE SEED IMPLANT/BRACHYTHERAPY IMPLANT;  Surgeon: Malen Gauze, MD;  Location: [facility];  Service: Urology;  Laterality: N/A;   SPACE OAR INSTILLATION N/A 05/26/2017   Procedure: SPACE OAR INSTILLATION;  Surgeon: Malen Gauze, MD;  Location: [facility];  Service: Urology;  Laterality: N/A;   TONSILLECTOMY  1970s   TOTAL KNEE ARTHROPLASTY N/A 08/23/2019   Procedure: TOTAL KNEE ARTHROPLASTY;  Surgeon: Ollen Gross, MD;  Location: [facility];  Service: Orthopedics;  Laterality: N/A;    Home Medications:  Allergies as of 10/23/2022       Reactions   Lisinopril Cough   Hctz [hydrochlorothiazide]    Unknown reaction   Lactose Intolerance (gi)    Quetiapine Fumarate Er Other (See Comments)   Risperdal [risperidone] Other (See Comments)   "not be lucid, be foggy"   Seroquel [quetiapine Fumarate] Other (See Comments)   Joint swelling   Seroquel [quetiapine] Other (See Comments)        Medication List        Accurate as of October 23, 2022 12:19 PM. If you have any questions, ask your nurse or doctor.          allopurinol 300 MG tablet Commonly known as: ZYLOPRIM Take 300 mg by mouth daily.   AMBULATORY NON  FORMULARY MEDICATION 0.2 mLs by Intracavernosal route as needed. Medication Name: Trimix  PGE Pap 30mg  Phent 1mg    amLODipine 10 MG tablet Commonly known as: NORVASC Take 10 mg by mouth daily.   amoxicillin 250 MG capsule Commonly known as: AMOXIL Take 1 capsule (250 mg total) by mouth 3 (three) times daily.   cyclobenzaprine 5 MG tablet Commonly known as: FLEXERIL Take 1 tablet (5 mg total) by mouth 3 (three) times daily as needed for muscle spasms.   donepezil 10 MG tablet Commonly known as: ARICEPT Take 1 tablet (10 mg total) by mouth at bedtime.   finasteride 5 MG tablet Commonly known as: PROSCAR TAKE 1 TABLET BY MOUTH  DAILY   losartan 100 MG tablet Commonly known as: COZAAR Take  100 mg by mouth at bedtime.   magnesium oxide 400 MG tablet Commonly known as: MAG-OX Take 400 mg by mouth daily.   memantine 10 MG tablet Commonly known as: NAMENDA Take 1 tablet (10 mg total) by mouth 2 (two) times daily. Must keep follow up 12/04/2020 for ongoing refills   mirabegron ER 50 MG Tb24 tablet Commonly known as: Myrbetriq Take 1 tablet (50 mg total) by mouth daily.   tamsulosin 0.4 MG Caps capsule Commonly known as: FLOMAX Take 1 capsule (0.4 mg total) by mouth daily.   traZODone 50 MG tablet Commonly known as: DESYREL Take 100 mg by mouth at bedtime.        Allergies:  Allergies  Allergen Reactions   Lisinopril Cough   Hctz [Hydrochlorothiazide]     Unknown reaction   Lactose Intolerance (Gi)    Quetiapine Fumarate Er Other (See Comments)   Risperdal [Risperidone] Other (See Comments)    "not be lucid, be foggy"   Seroquel [Quetiapine Fumarate] Other (See Comments)    Joint swelling   Seroquel [Quetiapine] Other (See Comments)    Family History: Family History  Problem Relation Age of Onset   Renal cancer Father    Cancer Paternal Aunt        breast   Heart attack Paternal Grandmother    Heart attack Paternal Grandfather    Sleep apnea Neg Hx     Social History:  reports that he quit smoking about 37 years ago. His smoking use included cigarettes. He started smoking about 57 years ago. He has a 30 pack-year smoking history. He has never used smokeless tobacco. He reports current alcohol use. He reports that he does not use drugs.  ROS: All other review of systems were reviewed and are negative except what is noted above in HPI  Physical Exam: BP (!) 141/73   Pulse 77   Constitutional:  Alert and oriented, No acute distress. HEENT: [place] AT, moist mucus membranes.  Trachea midline, no masses. Cardiovascular: No clubbing, cyanosis, or edema. Respiratory: Normal respiratory effort, no increased work of breathing. GI: Abdomen is soft, nontender,  nondistended, no abdominal masses GU: No CVA tenderness.  Lymph: No cervical or inguinal lymphadenopathy. Skin: No rashes, bruises or suspicious lesions. Neurologic: Grossly intact, no focal deficits, moving all 4 extremities. Psychiatric: Normal mood and affect.  Laboratory Data: Lab Results  Component Value Date   WBC 8.1 08/24/2019   HGB 11.9 (L) 08/24/2019   HCT 35.6 (L) 08/24/2019   MCV 93.0 08/24/2019   PLT 248 08/24/2019    Lab Results  Component Value Date   CREATININE 1.70 (H) 10/31/2020    No results found for: "PSA"  Lab Results  Component Value Date  TESTOSTERONE 413 07/23/2021    No results found for: "HGBA1C"  Urinalysis    Component Value Date/Time   APPEARANCEUR Clear 03/11/2022 1017   GLUCOSEU Negative 03/11/2022 1017   BILIRUBINUR Negative 03/11/2022 1017   PROTEINUR Negative 03/11/2022 1017   NITRITE Negative 03/11/2022 1017   LEUKOCYTESUR Negative 03/11/2022 1017    Lab Results  Component Value Date   LABMICR Comment 03/11/2022   WBCUA None seen 07/23/2021   LABEPIT 0-10 07/23/2021   MUCUS Present 07/23/2021   BACTERIA None seen 07/23/2021    Pertinent Imaging:  No results found for this or any previous visit.  No results found for this or any previous visit.  No results found for this or any previous visit.  No results found for this or any previous visit.  No results found for this or any previous visit.  No valid procedures specified. No results found for this or any previous visit.  No results found for this or any previous visit.   Assessment & Plan:    1. Prostate cancer (HCC) PSa today. Followup 6 months with PSA - PSA - PSA; Future  2. Erectile dysfunction due to arterial insufficiency Trimix PRN  3. OAB (overactive bladder) -mirabegron 50mg  daily   No follow-ups on file.  Wilkie Aye, MD  [facility] [place]

## 2022-10-24 LAB — PSA: Prostate Specific Ag, Serum: 0.6 ng/mL (ref 0.0–4.0)

## 2022-10-29 ENCOUNTER — Ambulatory Visit (INDEPENDENT_AMBULATORY_CARE_PROVIDER_SITE_OTHER): Payer: Medicare HMO | Admitting: Neurology

## 2022-10-29 ENCOUNTER — Encounter: Payer: Self-pay | Admitting: Urology

## 2022-10-29 DIAGNOSIS — G4733 Obstructive sleep apnea (adult) (pediatric): Secondary | ICD-10-CM | POA: Diagnosis not present

## 2022-10-29 NOTE — Patient Instructions (Signed)

## 2022-10-30 NOTE — Procedures (Signed)
[facility]  HOME SLEEP TEST (Watch PAT) REPORT  STUDY DATE: 10/29/2022  DOB: January 28, 1946  MRN: [id number]  ORDERING CLINICIAN: Huston Foley, MD, PhD   REFERRING CLINICIAN: Butch Penny, NP  CLINICAL INFORMATION/HISTORY: 77 year old male with an underlying medical history of hypertension, gout, arthritis, depression, prediabetes, history of prostate cancer, memory loss and mild obesity, who was previously diagnosed with obstructive sleep apnea and placed on PAP therapy.  He is currently on CPAP of 12 cm with EPR of 3 with good tolerance of treatment and adequate apnea control.  He should be eligible for new equipment.  He has an older machine.  BMI: 33 kg/m  FINDINGS:   Sleep Summary:   Total Recording Time (hours, min): 6 hours, 25 min  Total Sleep Time (hours, min):  5 hours, 23 min  Percent REM (%):    19.7%   Respiratory Indices:   Calculated pAHI (per hour):  13.4/hour         REM pAHI:    14.2/hour       NREM pAHI: 13.2/hour  Central pAHI: 1.1/hour  Oxygen Saturation Statistics:    Oxygen Saturation (%) Mean: 94%   Minimum oxygen saturation (%):                 84%   O2 Saturation Range (%): 84-98%    O2 Saturation (minutes) <=88%: 2.3 min  Pulse Rate Statistics:   Pulse Mean (bpm):    63/min    Pulse Range (53-88/min)   IMPRESSION: OSA (obstructive sleep apnea)    RECOMMENDATION:  This home sleep test demonstrates overall mild obstructive sleep apnea with a total AHI of 13.4/hour and O2 nadir of 84% with time below or at 88% saturation of 2.3 minutes for the night.  Snoring was intermittent, in the mild to moderate range.  This patient has been on CPAP therapy of 12 cm with EPR of 3 with good tolerance of treatment and adequate apnea control.  He has an older machine and should be eligible for new equipment.  I recommend staying with his current treatment settings of a set pressure of 12 cm with EPR of 3, mask of choice, sized to  fit. A full night, in-lab PAP titration study may aid in improving proper treatment settings and with mask fit, if needed, down the road. Alternative treatments may include weight loss (where appropriate) along with avoidance of the supine sleep position (if possible), or an oral appliance in appropriate candidates.   Please note that untreated obstructive sleep apnea may carry additional perioperative morbidity. Patients with significant obstructive sleep apnea should receive perioperative PAP therapy and the surgeons and particularly the anesthesiologist should be informed of the diagnosis and the severity of the sleep disordered breathing. The patient should be cautioned not to drive, work at heights, or operate dangerous or heavy equipment when tired or sleepy. Review and reiteration of good sleep hygiene measures should be pursued with any patient. Other causes of the patient's symptoms, including circadian rhythm disturbances, an underlying mood disorder, medication effect and/or an underlying medical problem cannot be ruled out based on this test. Clinical correlation is recommended.  The patient and his referring provider will be notified of the test results. The patient will be seen in follow up in sleep clinic at [facility], as necessary.  I certify that I have reviewed the raw data recording prior to the issuance of this report in accordance with the standards of the American Academy of Sleep Medicine (AASM).  INTERPRETING PHYSICIAN:   Huston Foley, MD, PhD Medical Director, [facility]) Diplomat, ABPN (Neurology and Sleep)   [facility] [address], [address] [place], [place] [address] [phone]

## 2022-10-30 NOTE — Progress Notes (Signed)
See procedure note.

## 2022-10-31 ENCOUNTER — Telehealth: Payer: Self-pay | Admitting: *Deleted

## 2022-10-31 NOTE — Telephone Encounter (Signed)
Order for new machine sent to [facility]

## 2022-10-31 NOTE — Addendum Note (Signed)
Addended by: Enedina Finner on: 10/31/2022 11:25 AM   Modules accepted: Orders

## 2022-10-31 NOTE — Telephone Encounter (Signed)
-----   Message from Butch Penny sent at 10/31/2022 11:25 AM EDT ----- Order placed for new machine

## 2022-10-31 NOTE — Telephone Encounter (Signed)
Pt called back, he would like to continue with [facility]

## 2022-11-07 NOTE — Telephone Encounter (Signed)
Order confirmed by [facility] today.

## 2022-12-13 ENCOUNTER — Ambulatory Visit: Payer: Medicare HMO | Admitting: Urology

## 2022-12-19 ENCOUNTER — Telehealth: Payer: Self-pay

## 2022-12-19 NOTE — Telephone Encounter (Signed)
Patient states he tried the trimix 20 units and he has not had positive results.

## 2022-12-30 ENCOUNTER — Telehealth: Payer: Self-pay

## 2022-12-30 NOTE — Telephone Encounter (Signed)
Returned call to follow up on triage call. Message left to return call to office.

## 2022-12-30 NOTE — Telephone Encounter (Signed)
-----   Message from Ludlow H sent at 12/30/2022  2:52 PM EDT ----- Please review.

## 2023-05-01 NOTE — Progress Notes (Signed)
 Setup date 12/11/2022

## 2023-05-05 ENCOUNTER — Ambulatory Visit: Payer: Medicare HMO | Admitting: Adult Health

## 2023-05-05 VITALS — BP 132/84 | HR 77 | Ht 68.0 in | Wt 223.0 lb

## 2023-05-05 DIAGNOSIS — G4733 Obstructive sleep apnea (adult) (pediatric): Secondary | ICD-10-CM

## 2023-05-05 DIAGNOSIS — R413 Other amnesia: Secondary | ICD-10-CM

## 2023-05-05 NOTE — Progress Notes (Signed)
PATIENT: Paul Moon DOB: 1945/09/15  REASON FOR VISIT: follow up HISTORY FROM: patient  Chief Complaint  Patient presents with   Follow-up    Patient in room #17 and alone. Patient states he was wondering if he could have another order for a MRI of head to see if any changes a current. Patient states all is well when it come to his CPAP. ESS-6 & FSS-12     HISTORY OF PRESENT ILLNESS: Today 05/05/23:  Paul Moon is a 78 y.o. male with a history of OSA on CPAP and memory disturbance. Returns today for follow-up. Reports that he has two machines. Some of the missed nights is when he was using his old machine.  Download is below  Memory: memory is better.  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  Able to complete all ADLs independently.  Currently attending college to get his undergraduate.  Remains on Aricept and Namenda    10/02/22: Paul Moon is a 78 y.o. male with a history of memory disturbance and OSA on CPAP. Returns today for follow-up.   OSA on CPAP: working well but humidification is not working. Setup date was in 2015. Would like a new machine.  He states that once he gets up to go to the bathroom he usually takes the machine off.  May sleep another 1-1/2 hours before he gets up.  Memory: has trouble focusing which affects his ability to get things done. This seemed to occur when he was not taking trazodone. He is back on his regular med schedule and now things seem to be better.  He remains on Aricept and Namenda.     03/21/22: Paul Moon is a 78 y.o. male with a history of Memory Disturbance and OSA on CPAP. Returns today for follow-up. Reports that he was playing golf with someone he plays with reguarly but couldn't remember his name for about 3 hours but then he was able to recall it. He reports that 2 months ago started having pain left side of head orginally just at night when he would wake up but now feels it during the day. Short lived sharp pain 2/10 pain  scale.  Starting to happen more frequently. Now happening about twice a week.  typically he is taking medication as events are very short  Reports that CPAP is working well for him.  Denies any new issues.  Download is below    5/22/2023[name] is a 78 year old male with a history of memory disturbance and obstructive sleep apnea on CPAP.  At the last visit he was instructed to restart CPAP. Since restarting energy level is better and BP is better. DL is better    06/11/79: [name] is a 78 year old male with a history of memory disturbance.  He returns today for follow-up.  He reports that his memory has remained stable.  Reports that his memory has improved with Namenda.  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  No difficulty with maintaining his job.  Able to complete all ADLs independently.  Reports he has not been using his CPAP for over 6 months.  However after we discussed the risk associated with untreated sleep apnea he states that he will be restarting his CPAP.  Had trouble with his mask i which led him to stop using it.  Returns today for an evaluation  11/15/19: [name] is a 78 year old male with a history of memory disturbance.  He returns today for follow-up.  He reports overall he has been stable.  He is able to complete all ADLs independently.  He operates a Librarian, academic without difficulty.  He manages his own finances.  He reports that he is semiretired.  Continues to work part-time.  He returns today for an evaluation.  HISTORY 11/24/18:   [name] is a 78 year old male with a history of memory loss.  He returns today for follow-up.  He reports that his memory has been stable.  He is able to complete all ADLs independently.  He manages his own finances without difficulty.  He does not do any cooking but this has been the norm for him.  Denies any changes in his mood or behavior.  He states that his significant other feels that his memory is worse.  He states that if he forgets  to get something at the grocery store she considers that a problem with his memory.  However he states that sometimes he is just preoccupied with other things and does not always process what she is asking.  He continues on Namenda and Aricept.  Tolerates these medications well.  Returns today for an evaluation.  REVIEW OF SYSTEMS: Out of a complete 14 system review of symptoms, the patient complains only of the following symptoms, and all other reviewed systems are negative.  See HPI  ALLERGIES: Allergies  Allergen Reactions   Lisinopril Cough   Hctz [Hydrochlorothiazide]     Unknown reaction   Lactose Intolerance (Gi)    Quetiapine Fumarate Er Other (See Comments)   Risperdal [Risperidone] Other (See Comments)    "not be lucid, be foggy"   Seroquel [Quetiapine Fumarate] Other (See Comments)    Joint swelling   Seroquel [Quetiapine] Other (See Comments)    HOME MEDICATIONS: Outpatient Medications Prior to Visit  Medication Sig Dispense Refill   allopurinol (ZYLOPRIM) 300 MG tablet Take 300 mg by mouth daily.     AMBULATORY NON FORMULARY MEDICATION 0.2 mLs by Intracavernosal route as needed. Medication Name: Trimix  PGE Pap 30mg  Phent 1mg  5 mL 5   amLODipine (NORVASC) 10 MG tablet Take 10 mg by mouth daily.     cyclobenzaprine (FLEXERIL) 5 MG tablet Take 1 tablet (5 mg total) by mouth 3 (three) times daily as needed for muscle spasms. 30 tablet 0   donepezil (ARICEPT) 10 MG tablet Take 1 tablet (10 mg total) by mouth at bedtime. 7 tablet 0   finasteride (PROSCAR) 5 MG tablet Take 1 tablet (5 mg total) by mouth daily. 90 tablet 3   losartan (COZAAR) 100 MG tablet Take 100 mg by mouth at bedtime.      memantine (NAMENDA) 10 MG tablet Take 1 tablet (10 mg total) by mouth 2 (two) times daily. Must keep follow up 12/04/2020 for ongoing refills 180 tablet 1   tamsulosin (FLOMAX) 0.4 MG CAPS capsule Take 1 capsule (0.4 mg total) by mouth daily. 90 capsule 3   traZODone (DESYREL)  100 MG tablet Take 100 mg by mouth at bedtime.     traZODone (DESYREL) 50 MG tablet Take 100 mg by mouth at bedtime.     amoxicillin (AMOXIL) 250 MG capsule Take 1 capsule (250 mg total) by mouth 3 (three) times daily. (Patient not taking: Reported on 05/05/2023) 21 capsule 0   magnesium oxide (MAG-OX) 400 MG tablet Take 400 mg by mouth daily. (Patient not taking: Reported on 05/05/2023)     mirabegron ER (MYRBETRIQ) 50 MG TB24 tablet Take 1 tablet (50 mg total)  by mouth daily. (Patient not taking: Reported on 05/05/2023) 90 tablet 3   No facility-administered medications prior to visit.    PAST MEDICAL HISTORY: Past Medical History:  Diagnosis Date   Arthritis    INDEX FINGER JOINT LEFT HAND, right knee   Bipolar disorder (HCC)    Depression    History of gout 2018   right hand 2 fingers and right wrist-- 05-20-2017 per pt resolved   Hyperplasia of prostate with lower urinary tract symptoms (LUTS)    Hypertension    OSA on CPAP    per study 03-22-2013  moderate OSA w/ AHI 26.8/hr   Pinched cervical nerve root    PT STATES C 7-8 PINCHED NERVE CAUSING NUMBNESS MIDDLE, RING AND LITTLE FINGER LEFT HAND - NO PAIN - STATES HE IS TRYING TO AVOID SURGERY.   Pre-diabetes    followed by pcp   Prostate cancer [facility]) urologist-  dr Ronne Binning  oncologist-  dr Kathrynn Running   dx 05-24-2016  Stage T1c, Gleason 4+3, PSA 5.49, vol 65.5cc/  pt seek second opinion's at [facility] not candidate for focal laser ablation/  scheduled for radiactive seed implants 05-26-2017   Wears glasses     PAST SURGICAL HISTORY: Past Surgical History:  Procedure Laterality Date   CIRCUMCISION  1973 approx.   KNEE ARTHROSCOPY Left 04/28/2013   Procedure: LEFT ARTHROSCOPY KNEE WITH DEBRIDEMENT meniscal debridement;  Surgeon: Loanne Drilling, MD;  Location: [facility];  Service: Orthopedics;  Laterality: Left;   KNEE SURGERY Right 2008   left knee replacment      2023   MR GUIDED PROSTATE BIOPSY  12-12-2016    [facility]  [place], [place])   w/ general anesthesia   PROSTATE BIOPSY  05-29-2016   dr Ronne Binning office   RADIOACTIVE SEED IMPLANT N/A 05/26/2017   Procedure: RADIOACTIVE SEED IMPLANT/BRACHYTHERAPY IMPLANT;  Surgeon: Malen Gauze, MD;  Location: [facility];  Service: Urology;  Laterality: N/A;   SPACE OAR INSTILLATION N/A 05/26/2017   Procedure: SPACE OAR INSTILLATION;  Surgeon: Malen Gauze, MD;  Location: [facility];  Service: Urology;  Laterality: N/A;   TONSILLECTOMY  1970s   TOTAL KNEE ARTHROPLASTY N/A 08/23/2019   Procedure: TOTAL KNEE ARTHROPLASTY;  Surgeon: Ollen Gross, MD;  Location: [facility];  Service: Orthopedics;  Laterality: N/A;    FAMILY HISTORY: Family History  Problem Relation Age of Onset   Renal cancer Father    Cancer Paternal Aunt        breast   Heart attack Paternal Grandmother    Heart attack Paternal Grandfather    Sleep apnea Neg Hx     SOCIAL HISTORY: Social History   Socioeconomic History   Marital status: Divorced    Spouse name: Not on file   Number of children: 2   Years of education: College   Highest education level: Not on file  Occupational History   Occupation: Semi-Retired  Tobacco Use   Smoking status: Former    Current packs/day: 0.00    Average packs/day: 1.5 packs/day for 20.0 years (30.0 ttl pk-yrs)    Types: Cigarettes    Start date: 05/26/1965    Quit date: 05/26/1985    Years since quitting: 37.9   Smokeless tobacco: Never  Vaping Use   Vaping status: Never Used  Substance and Sexual Activity   Alcohol use: Yes    Comment: occasional wine   Drug use: No   Sexual activity: Yes  Other Topics  Concern   Not on file  Social History Narrative   Patient lives at home alone.   Caffeine Use: occasionally   Social Drivers of Corporate investment banker Strain: Not on file  Food Insecurity: Not on file  Transportation Needs: Not on file  Physical Activity: Not on file  Stress: Not on file   Social Connections: Not on file  Intimate Partner Violence: Not on file      PHYSICAL EXAM  Vitals:   05/05/23 1010  BP: 132/84  Pulse: 77  Weight: 223 lb (101.2 kg)  Height: 5\' 8"  (1.727 m)      Body mass index is 33.91 kg/m.      05/05/2023   10:50 AM 10/02/2022   10:30 AM 03/21/2022    2:46 PM  MMSE - Mini Mental State Exam  Orientation to time 5 5 5   Orientation to Place 5 5 5   Registration 3 3 3   Attention/ Calculation 5 5 5   Recall 3 3 3   Language- name 2 objects 2 2 2   Language- repeat 1 1 1   Language- follow 3 step command 3 3 3   Language- read & follow direction 1 1 1   Write a sentence 1 1 1   Copy design 1 1 0  Total score 30 30 29      Generalized: Well developed, in no acute distress   Neurological examination  Mentation: Alert oriented to time, place, history taking. Follows all commands speech and language fluent Cranial nerve II-XII: Facial symmetry noted Gait and station: Gait is normal.     DIAGNOSTIC DATA (LABS, IMAGING, TESTING) - I reviewed patient records, labs, notes, testing and imaging myself where available.  Lab Results  Component Value Date   WBC 8.1 08/24/2019   HGB 11.9 (L) 08/24/2019   HCT 35.6 (L) 08/24/2019   MCV 93.0 08/24/2019   PLT 248 08/24/2019      Component Value Date/Time   NA 134 (L) 08/24/2019 0301   K 4.4 08/24/2019 0301   CL 101 08/24/2019 0301   CO2 24 08/24/2019 0301   GLUCOSE 150 (H) 08/24/2019 0301   BUN 13 08/24/2019 0301   CREATININE 1.70 (H) 10/31/2020 1654   CALCIUM 8.6 (L) 08/24/2019 0301   PROT 7.4 08/16/2019 0844   ALBUMIN 4.0 08/16/2019 0844   AST 23 08/16/2019 0844   ALT 23 08/16/2019 0844   ALKPHOS 73 08/16/2019 0844   BILITOT 0.7 08/16/2019 0844   GFRNONAA 58 (L) 08/24/2019 0301   GFRAA >60 08/24/2019 0301    Lab Results  Component Value Date   TSH 2.620 03/22/2013      ASSESSMENT AND PLAN 78 y.o. year old male  has a past medical history of Arthritis, Bipolar disorder  (HCC), Depression, History of gout (2018), Hyperplasia of prostate with lower urinary tract symptoms (LUTS), Hypertension, OSA on CPAP, Pinched cervical nerve root, Pre-diabetes, Prostate cancer [facility]) (urologist-  dr Ronne Binning  oncologist-  dr Kathrynn Running), and Wears glasses. here with:  1.  Memory disturbance  Continue Aricept 10 mg at bedtime Continue Namenda 10mg  BID MMSE 30/30   2.  Obstructive sleep apnea on CPAP  Good Compliance Residual AHI in normal range Encourage patient to continue using CPAP nightly and greater than 4 hours each night Home sleep test ordered pending results we will order new machine   FU in 1 year or sooner if needed  Butch Penny, MSN, NP-C 05/05/2023, 10:30 AM [facility] [address] [place], [place] [address] [phone]  

## 2023-05-13 ENCOUNTER — Other Ambulatory Visit: Payer: Medicare HMO

## 2023-05-13 DIAGNOSIS — C61 Malignant neoplasm of prostate: Secondary | ICD-10-CM

## 2023-05-14 LAB — PSA: Prostate Specific Ag, Serum: 0.4 ng/mL (ref 0.0–4.0)

## 2023-05-21 ENCOUNTER — Ambulatory Visit: Payer: Medicare HMO | Admitting: Urology

## 2023-05-21 VITALS — BP 159/66 | HR 80

## 2023-05-21 DIAGNOSIS — N5201 Erectile dysfunction due to arterial insufficiency: Secondary | ICD-10-CM

## 2023-05-21 DIAGNOSIS — C61 Malignant neoplasm of prostate: Secondary | ICD-10-CM

## 2023-05-21 DIAGNOSIS — N3281 Overactive bladder: Secondary | ICD-10-CM | POA: Diagnosis not present

## 2023-05-21 LAB — URINALYSIS, ROUTINE W REFLEX MICROSCOPIC
Bilirubin, UA: NEGATIVE
Glucose, UA: NEGATIVE
Ketones, UA: NEGATIVE
Leukocytes,UA: NEGATIVE
Nitrite, UA: NEGATIVE
Protein,UA: NEGATIVE
RBC, UA: NEGATIVE
Specific Gravity, UA: 1.02 (ref 1.005–1.030)
Urobilinogen, Ur: 0.2 mg/dL (ref 0.2–1.0)
pH, UA: 7 (ref 5.0–7.5)

## 2023-05-21 MED ORDER — AMBULATORY NON FORMULARY MEDICATION: 0.2000 mL | 5 refills | Status: DC | PRN

## 2023-05-21 MED ORDER — FINASTERIDE 5 MG PO TABS: 5.0000 mg | ORAL_TABLET | Freq: Every day | ORAL | 3 refills | Status: DC

## 2023-05-21 MED ORDER — TAMSULOSIN HCL 0.4 MG PO CAPS: 0.4000 mg | ORAL_CAPSULE | Freq: Every day | ORAL | 3 refills | Status: DC

## 2023-05-21 MED ORDER — MIRABEGRON ER 50 MG PO TB24: 50.0000 mg | ORAL_TABLET | Freq: Every day | ORAL | 3 refills | Status: DC

## 2023-05-21 NOTE — Progress Notes (Signed)
 05/21/2023 11:33 AM   JAIVEON SUPPES 1945/07/05 [id number]  Referring provider: Raymon Mutton., FNP [address] [place],  [place] [address]  Followup prostate cancer   HPI: [name] is a 78yo here for followup for prostate cancer, OAB and erectile dysfunction. PSA 0.4 which is stable.  He remains on mirabegron, flomax and finasteride. IPSS 14 QOL 2. Urine stream good. He has occasional urinary urgency. He continues to have issues getting and maintaining an erection. He tried trimix 0.33ml without success.    PMH: Past Medical History:  Diagnosis Date   Arthritis    INDEX FINGER JOINT LEFT HAND, right knee   Bipolar disorder (HCC)    Depression    History of gout 2018   right hand 2 fingers and right wrist-- 05-20-2017 per pt resolved   Hyperplasia of prostate with lower urinary tract symptoms (LUTS)    Hypertension    OSA on CPAP    per study 03-22-2013  moderate OSA w/ AHI 26.8/hr   Pinched cervical nerve root    PT STATES C 7-8 PINCHED NERVE CAUSING NUMBNESS MIDDLE, RING AND LITTLE FINGER LEFT HAND - NO PAIN - STATES HE IS TRYING TO AVOID SURGERY.   Pre-diabetes    followed by pcp   Prostate cancer [facility]) urologist-  dr Ronne Binning  oncologist-  dr Kathrynn Running   dx 05-24-2016  Stage T1c, Gleason 4+3, PSA 5.49, vol 65.5cc/  pt seek second opinion's at [facility] and [facility] not candidate for focal laser ablation/  scheduled for radiactive seed implants 05-26-2017   Wears glasses     Surgical History: Past Surgical History:  Procedure Laterality Date   CIRCUMCISION  1973 approx.   KNEE ARTHROSCOPY Left 04/28/2013   Procedure: LEFT ARTHROSCOPY KNEE WITH DEBRIDEMENT meniscal debridement;  Surgeon: Loanne Drilling, MD;  Location: [facility];  Service: Orthopedics;  Laterality: Left;   KNEE SURGERY Right 2008   left knee replacment      2023   MR GUIDED PROSTATE BIOPSY  12-12-2016    [facility] [place], [place])   w/ general anesthesia   PROSTATE BIOPSY  05-29-2016   dr Ronne Binning  office   RADIOACTIVE SEED IMPLANT N/A 05/26/2017   Procedure: RADIOACTIVE SEED IMPLANT/BRACHYTHERAPY IMPLANT;  Surgeon: Malen Gauze, MD;  Location: [facility];  Service: Urology;  Laterality: N/A;   SPACE OAR INSTILLATION N/A 05/26/2017   Procedure: SPACE OAR INSTILLATION;  Surgeon: Malen Gauze, MD;  Location: [facility];  Service: Urology;  Laterality: N/A;   TONSILLECTOMY  1970s   TOTAL KNEE ARTHROPLASTY N/A 08/23/2019   Procedure: TOTAL KNEE ARTHROPLASTY;  Surgeon: Ollen Gross, MD;  Location: [facility];  Service: Orthopedics;  Laterality: N/A;    Home Medications:  Allergies as of 05/21/2023       Reactions   Lisinopril Cough   Hctz [hydrochlorothiazide]    Unknown reaction   Lactose Intolerance (gi)    Quetiapine Fumarate Er Other (See Comments)   Risperdal [risperidone] Other (See Comments)   "not be lucid, be foggy"   Seroquel [quetiapine Fumarate] Other (See Comments)   Joint swelling   Seroquel [quetiapine] Other (See Comments)        Medication List        Accurate as of May 21, 2023 11:33 AM. If you have any questions, ask your nurse or doctor.          allopurinol 300 MG tablet Commonly known as: ZYLOPRIM Take 300 mg by  mouth daily.   AMBULATORY NON FORMULARY MEDICATION 0.2 mLs by Intracavernosal route as needed. Medication Name: Trimix  PGE Pap 30mg  Phent 1mg    amLODipine 10 MG tablet Commonly known as: NORVASC Take 10 mg by mouth daily.   amoxicillin 250 MG capsule Commonly known as: AMOXIL Take 1 capsule (250 mg total) by mouth 3 (three) times daily.   cyclobenzaprine 5 MG tablet Commonly known as: FLEXERIL Take 1 tablet (5 mg total) by mouth 3 (three) times daily as needed for muscle spasms.   donepezil 10 MG tablet Commonly known as: ARICEPT Take 1 tablet (10 mg total) by mouth at bedtime.   finasteride 5 MG tablet Commonly known as: PROSCAR Take 1 tablet (5 mg total) by mouth  daily.   losartan 100 MG tablet Commonly known as: COZAAR Take 100 mg by mouth at bedtime.   magnesium oxide 400 MG tablet Commonly known as: MAG-OX Take 400 mg by mouth daily.   memantine 10 MG tablet Commonly known as: NAMENDA Take 1 tablet (10 mg total) by mouth 2 (two) times daily. Must keep follow up 12/04/2020 for ongoing refills   mirabegron ER 50 MG Tb24 tablet Commonly known as: Myrbetriq Take 1 tablet (50 mg total) by mouth daily.   tamsulosin 0.4 MG Caps capsule Commonly known as: FLOMAX Take 1 capsule (0.4 mg total) by mouth daily.   traZODone 50 MG tablet Commonly known as: DESYREL Take 100 mg by mouth at bedtime.   traZODone 100 MG tablet Commonly known as: DESYREL Take 100 mg by mouth at bedtime.        Allergies:  Allergies  Allergen Reactions   Lisinopril Cough   Hctz [Hydrochlorothiazide]     Unknown reaction   Lactose Intolerance (Gi)    Quetiapine Fumarate Er Other (See Comments)   Risperdal [Risperidone] Other (See Comments)    "not be lucid, be foggy"   Seroquel [Quetiapine Fumarate] Other (See Comments)    Joint swelling   Seroquel [Quetiapine] Other (See Comments)    Family History: Family History  Problem Relation Age of Onset   Renal cancer Father    Cancer Paternal Aunt        breast   Heart attack Paternal Grandmother    Heart attack Paternal Grandfather    Sleep apnea Neg Hx     Social History:  reports that he quit smoking about 38 years ago. His smoking use included cigarettes. He started smoking about 58 years ago. He has a 30 pack-year smoking history. He has never used smokeless tobacco. He reports current alcohol use. He reports that he does not use drugs.  ROS: All other review of systems were reviewed and are negative except what is noted above in HPI  Physical Exam: BP (!) 159/66   Pulse 80   Constitutional:  Alert and oriented, No acute distress. HEENT: [place] AT, moist mucus membranes.  Trachea midline, no  masses. Cardiovascular: No clubbing, cyanosis, or edema. Respiratory: Normal respiratory effort, no increased work of breathing. GI: Abdomen is soft, nontender, nondistended, no abdominal masses GU: No CVA tenderness.  Lymph: No cervical or inguinal lymphadenopathy. Skin: No rashes, bruises or suspicious lesions. Neurologic: Grossly intact, no focal deficits, moving all 4 extremities. Psychiatric: Normal mood and affect.  Laboratory Data: Lab Results  Component Value Date   WBC 8.1 08/24/2019   HGB 11.9 (L) 08/24/2019   HCT 35.6 (L) 08/24/2019   MCV 93.0 08/24/2019   PLT 248 08/24/2019    Lab Results  Component Value Date   CREATININE 1.70 (H) 10/31/2020    No results found for: "PSA"  Lab Results  Component Value Date   TESTOSTERONE 413 07/23/2021    No results found for: "HGBA1C"  Urinalysis    Component Value Date/Time   APPEARANCEUR Clear 03/11/2022 1017   GLUCOSEU Negative 03/11/2022 1017   BILIRUBINUR Negative 03/11/2022 1017   PROTEINUR Negative 03/11/2022 1017   NITRITE Negative 03/11/2022 1017   LEUKOCYTESUR Negative 03/11/2022 1017    Lab Results  Component Value Date   LABMICR Comment 03/11/2022   WBCUA None seen 07/23/2021   LABEPIT 0-10 07/23/2021   MUCUS Present 07/23/2021   BACTERIA None seen 07/23/2021    Pertinent Imaging:  No results found for this or any previous visit.  No results found for this or any previous visit.  No results found for this or any previous visit.  No results found for this or any previous visit.  No results found for this or any previous visit.  No results found for this or any previous visit.  No results found for this or any previous visit.  No results found for this or any previous visit.   Assessment & Plan:    1. Prostate cancer (HCC) (Primary) Followup 1 year with PSA - Urinalysis, Routine w reflex microscopic  2. Erectile dysfunction due to arterial insufficiency Increase trimix to  0.55ml  3. OAB (overactive bladder) Continue mirabegron 50mg  daily   No follow-ups on file.  Wilkie Aye, MD  [facility] [place]

## 2023-05-27 ENCOUNTER — Telehealth: Payer: Self-pay | Admitting: Urology

## 2023-05-27 ENCOUNTER — Encounter: Payer: Self-pay | Admitting: Urology

## 2023-05-27 NOTE — Patient Instructions (Signed)

## 2023-05-27 NOTE — Telephone Encounter (Signed)
 Lost his RX for Trimex wants to see if we can get him another copy

## 2023-05-28 ENCOUNTER — Other Ambulatory Visit: Payer: Self-pay

## 2023-05-28 DIAGNOSIS — N5201 Erectile dysfunction due to arterial insufficiency: Secondary | ICD-10-CM

## 2023-05-28 MED ORDER — AMBULATORY NON FORMULARY MEDICATION: 0.2000 mL | 5 refills | Status: AC | PRN

## 2023-05-28 NOTE — Telephone Encounter (Signed)
 Patient called again wanting to pick up RX

## 2023-05-28 NOTE — Telephone Encounter (Signed)
 Called Pt to let him know he can pick up his Rx

## 2023-11-04 ENCOUNTER — Telehealth: Payer: Self-pay

## 2023-11-04 DIAGNOSIS — N3281 Overactive bladder: Secondary | ICD-10-CM

## 2023-11-04 DIAGNOSIS — C61 Malignant neoplasm of prostate: Secondary | ICD-10-CM

## 2023-11-04 MED ORDER — TAMSULOSIN HCL 0.4 MG PO CAPS: 0.4000 mg | ORAL_CAPSULE | Freq: Every day | ORAL | 3 refills | Status: DC

## 2023-11-04 MED ORDER — FINASTERIDE 5 MG PO TABS: 5.0000 mg | ORAL_TABLET | Freq: Every day | ORAL | 3 refills | Status: DC

## 2023-11-04 NOTE — Telephone Encounter (Signed)
 Return call to pharmacy to confirm medication with no answer. Rx refill sent to pt pharmacy

## 2023-11-18 ENCOUNTER — Other Ambulatory Visit

## 2023-11-18 DIAGNOSIS — C61 Malignant neoplasm of prostate: Secondary | ICD-10-CM

## 2023-11-19 LAB — PSA: Prostate Specific Ag, Serum: 0.5 ng/mL (ref 0.0–4.0)

## 2023-11-21 ENCOUNTER — Encounter: Payer: Self-pay | Admitting: Urology

## 2023-11-21 ENCOUNTER — Ambulatory Visit: Admitting: Urology

## 2023-11-21 VITALS — BP 142/62 | HR 78

## 2023-11-21 DIAGNOSIS — C61 Malignant neoplasm of prostate: Secondary | ICD-10-CM

## 2023-11-21 DIAGNOSIS — N5201 Erectile dysfunction due to arterial insufficiency: Secondary | ICD-10-CM | POA: Diagnosis not present

## 2023-11-21 DIAGNOSIS — N3281 Overactive bladder: Secondary | ICD-10-CM

## 2023-11-21 LAB — URINALYSIS, ROUTINE W REFLEX MICROSCOPIC
Bilirubin, UA: NEGATIVE
Glucose, UA: NEGATIVE
Ketones, UA: NEGATIVE
Leukocytes,UA: NEGATIVE
Nitrite, UA: NEGATIVE
Protein,UA: NEGATIVE
RBC, UA: NEGATIVE
Specific Gravity, UA: 1.02 (ref 1.005–1.030)
Urobilinogen, Ur: 0.2 mg/dL (ref 0.2–1.0)
pH, UA: 6 (ref 5.0–7.5)

## 2023-11-21 MED ORDER — FINASTERIDE 5 MG PO TABS: 5.0000 mg | ORAL_TABLET | Freq: Every day | ORAL | 3 refills | Status: AC

## 2023-11-21 MED ORDER — TAMSULOSIN HCL 0.4 MG PO CAPS: 0.4000 mg | ORAL_CAPSULE | Freq: Every day | ORAL | 3 refills | Status: AC

## 2023-11-21 MED ORDER — MIRABEGRON ER 50 MG PO TB24: 50.0000 mg | ORAL_TABLET | Freq: Every day | ORAL | 11 refills | Status: AC

## 2023-11-21 NOTE — Progress Notes (Signed)
 11/21/2023 10:26 AM   Paul Moon Jul 22, 1945 [id number]  Referring provider: Claudene Prentice DELENA Mickey., FNP [address] [place],  [place] [address]  Followup prostate cancer and OAB   HPI: Mr Sherrow is a 77yo here for followup for prostate cancer, OAB and BPH. PSA 0.5. IPSS 3 QOL 1 on flomax  0.4mg  daily and finasteride  5mg  daily. Urine stream strong. No straining to urinate. He continues to have issues getting and maintaining an erection. He was given rx for trimix but has not used it in several months.  His nocturia is dependent on fluid consumption at night. He has urinary frequency every 1-2 hours   PMH: Past Medical History:  Diagnosis Date   Arthritis    INDEX FINGER JOINT LEFT HAND, right knee   Bipolar disorder (HCC)    Depression    History of gout 2018   right hand 2 fingers and right wrist-- 05-20-2017 per pt resolved   Hyperplasia of prostate with lower urinary tract symptoms (LUTS)    Hypertension    OSA on CPAP    per study 03-22-2013  moderate OSA w/ AHI 26.8/hr   Pinched cervical nerve root    PT STATES C 7-8 PINCHED NERVE CAUSING NUMBNESS MIDDLE, RING AND LITTLE FINGER LEFT HAND - NO PAIN - STATES HE IS TRYING TO AVOID SURGERY.   Pre-diabetes    followed by pcp   Prostate cancer [facility]) urologist-  dr thalia  oncologist-  dr patrcia   dx 05-24-2016  Stage T1c, Gleason 4+3, PSA 5.49, vol 65.5cc/  pt seek second opinion's at [facility] and [facility] not candidate for focal laser ablation/  scheduled for radiactive seed implants 05-26-2017   Wears glasses     Surgical History: Past Surgical History:  Procedure Laterality Date   CIRCUMCISION  1973 approx.   KNEE ARTHROSCOPY Left 04/28/2013   Procedure: LEFT ARTHROSCOPY KNEE WITH DEBRIDEMENT meniscal debridement;  Surgeon: Dempsey LULLA Moan, MD;  Location[facility];  Service: Orthopedics;  Laterality: Left;   KNEE SURGERY Right 2008   left knee replacment      2023   MR GUIDED PROSTATE BIOPSY  12-12-2016    [facility] [place], [place])   w/ general anesthesia   PROSTATE BIOPSY  05-29-2016   dr sherrilee office   RADIOACTIVE SEED IMPLANT N/A 05/26/2017   Procedure: RADIOACTIVE SEED IMPLANT/BRACHYTHERAPY IMPLANT;  Surgeon: sherrilee Belvie CROME, MD;  Location: [facility];  Service: Urology;  Laterality: N/A;   SPACE OAR INSTILLATION N/A 05/26/2017   Procedure: SPACE OAR INSTILLATION;  Surgeon: sherrilee Belvie CROME, MD;  Location: [facility];  Service: Urology;  Laterality: N/A;   TONSILLECTOMY  1970s   TOTAL KNEE ARTHROPLASTY N/A 08/23/2019   Procedure: TOTAL KNEE ARTHROPLASTY;  Surgeon: Moan Dempsey, MD;  Location: [facility];  Service: Orthopedics;  Laterality: N/A;    Home Medications:  Allergies as of 11/21/2023       Reactions   Lisinopril Cough   Hctz [hydrochlorothiazide]    Unknown reaction   Lactose Intolerance (gi)    Quetiapine Fumarate Er Other (See Comments)   Risperdal [risperidone] Other (See Comments)   not be lucid, be foggy   Seroquel [quetiapine Fumarate] Other (See Comments)   Joint swelling   Seroquel [quetiapine] Other (See Comments)        Medication List        Accurate as of November 21, 2023 10:26 AM. If you have any questions, ask your nurse or doctor.  allopurinol  300 MG tablet Commonly known as: ZYLOPRIM  Take 300 mg by mouth daily.   AMBULATORY NON FORMULARY MEDICATION 0.2 mLs by Intracavernosal route as needed. Medication Name: Trimix  PGE 30mcg Pap 30mg  Phent 1mg    amLODipine 10 MG tablet Commonly known as: NORVASC Take 10 mg by mouth daily.   amoxicillin  250 MG capsule Commonly known as: AMOXIL  Take 1 capsule (250 mg total) by mouth 3 (three) times daily.   cyclobenzaprine  5 MG tablet Commonly known as: FLEXERIL  Take 1 tablet (5 mg total) by mouth 3 (three) times daily as needed for muscle spasms.   donepezil  10 MG tablet Commonly known as: ARICEPT  Take 1 tablet (10 mg total) by mouth at  bedtime.   finasteride  5 MG tablet Commonly known as: PROSCAR  Take 1 tablet (5 mg total) by mouth daily.   losartan  100 MG tablet Commonly known as: COZAAR  Take 100 mg by mouth at bedtime.   magnesium oxide 400 MG tablet Commonly known as: MAG-OX Take 400 mg by mouth daily.   memantine  10 MG tablet Commonly known as: NAMENDA  Take 1 tablet (10 mg total) by mouth 2 (two) times daily. Must keep follow up 12/04/2020 for ongoing refills   mirabegron  ER 50 MG Tb24 tablet Commonly known as: Myrbetriq  Take 1 tablet (50 mg total) by mouth daily.   tamsulosin  0.4 MG Caps capsule Commonly known as: FLOMAX  Take 1 capsule (0.4 mg total) by mouth daily.   traZODone 50 MG tablet Commonly known as: DESYREL Take 100 mg by mouth at bedtime.   traZODone 100 MG tablet Commonly known as: DESYREL Take 100 mg by mouth at bedtime.        Allergies:  Allergies  Allergen Reactions   Lisinopril Cough   Hctz [Hydrochlorothiazide]     Unknown reaction   Lactose Intolerance (Gi)    Quetiapine Fumarate Er Other (See Comments)   Risperdal [Risperidone] Other (See Comments)    not be lucid, be foggy   Seroquel [Quetiapine Fumarate] Other (See Comments)    Joint swelling   Seroquel [Quetiapine] Other (See Comments)    Family History: Family History  Problem Relation Age of Onset   Renal cancer Father    Cancer Paternal Aunt        breast   Heart attack Paternal Grandmother    Heart attack Paternal Grandfather    Sleep apnea Neg Hx     Social History:  reports that he quit smoking about 38 years ago. His smoking use included cigarettes. He started smoking about 58 years ago. He has a 30 pack-year smoking history. He has never used smokeless tobacco. He reports current alcohol use. He reports that he does not use drugs.  ROS: All other review of systems were reviewed and are negative except what is noted above in HPI  Physical Exam: BP (!) 142/62   Pulse 78   Constitutional:   Alert and oriented, No acute distress. HEENT: [place] AT, moist mucus membranes.  Trachea midline, no masses. Cardiovascular: No clubbing, cyanosis, or edema. Respiratory: Normal respiratory effort, no increased work of breathing. GI: Abdomen is soft, nontender, nondistended, no abdominal masses GU: No CVA tenderness.  Lymph: No cervical or inguinal lymphadenopathy. Skin: No rashes, bruises or suspicious lesions. Neurologic: Grossly intact, no focal deficits, moving all 4 extremities. Psychiatric: Normal mood and affect.  Laboratory Data: Lab Results  Component Value Date   WBC 8.1 08/24/2019   HGB 11.9 (L) 08/24/2019   HCT 35.6 (L) 08/24/2019   MCV  93.0 08/24/2019   PLT 248 08/24/2019    Lab Results  Component Value Date   CREATININE 1.70 (H) 10/31/2020    No results found for: PSA  Lab Results  Component Value Date   TESTOSTERONE  413 07/23/2021    No results found for: HGBA1C  Urinalysis    Component Value Date/Time   APPEARANCEUR Clear 05/21/2023 1126   GLUCOSEU Negative 05/21/2023 1126   BILIRUBINUR Negative 05/21/2023 1126   PROTEINUR Negative 05/21/2023 1126   NITRITE Negative 05/21/2023 1126   LEUKOCYTESUR Negative 05/21/2023 1126    Lab Results  Component Value Date   LABMICR Comment 05/21/2023   WBCUA None seen 07/23/2021   LABEPIT 0-10 07/23/2021   MUCUS Present 07/23/2021   BACTERIA None seen 07/23/2021    Pertinent Imaging:  No results found for this or any previous visit.  No results found for this or any previous visit.  No results found for this or any previous visit.  No results found for this or any previous visit.  No results found for this or any previous visit.  No results found for this or any previous visit.  No results found for this or any previous visit.  No results found for this or any previous visit.   Assessment & Plan:    1. Prostate cancer (HCC) (Primary) Followup 6 months with PSA - Urinalysis, Routine w  reflex microscopic  2. OAB (overactive bladder) We will restart mirabegron  50mg  qhs  3. Erectile dysfunction due to arterial insufficiency -patient defers therapy at this time   No follow-ups on file.  Belvie Clara, MD  [facility] [place]

## 2023-11-21 NOTE — Patient Instructions (Signed)

## 2023-11-24 ENCOUNTER — Ambulatory Visit: Admitting: Adult Health

## 2023-11-24 ENCOUNTER — Encounter: Payer: Self-pay | Admitting: Adult Health

## 2023-11-24 NOTE — Progress Notes (Deleted)
 PATIENT: Paul Moon DOB: [birth year]  REASON FOR VISIT: follow up HISTORY FROM: patient  No chief complaint on file.    HISTORY OF PRESENT ILLNESS: Today 11/24/23:  Paul Moon is a 78 y.o. male with a history of OSA on CPAP and memory disturbance. Returns today for follow-up. Reports that he has two machines. Some of the missed nights is when he was using his old machine.  Download is below  Memory: memory is better.  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  Able to complete all ADLs independently.  Currently attending college to get his undergraduate.  Remains on Aricept  and Namenda     10/02/22: MARCELLOUS Moon is a 78 y.o. male with a history of memory disturbance and OSA on CPAP. Returns today for follow-up.   OSA on CPAP: working well but humidification is not working. Setup date was in 2015. Would like a new machine.  He states that once he gets up to go to the bathroom he usually takes the machine off.  May sleep another 1-1/2 hours before he gets up.  Memory: has trouble focusing which affects his ability to get things done. This seemed to occur when he was not taking trazodone. He is back on his regular med schedule and now things seem to be better.  He remains on Aricept  and Namenda .     03/21/22: Paul Moon is a 78 y.o. male with a history of Memory Disturbance and OSA on CPAP. Returns today for follow-up. Reports that he was playing golf with someone he plays with reguarly but couldn't remember his name for about 3 hours but then he was able to recall it. He reports that 2 months ago started having pain left side of head orginally just at night when he would wake up but now feels it during the day. Short lived sharp pain 2/10 pain scale.  Starting to happen more frequently. Now happening about twice a week.  typically he is taking medication as events are very short  Reports that CPAP is working well for him.  Denies any new issues.  Download is  below    5/22/2023Mr. Paul Moon is a 78 year old male with a history of memory disturbance and obstructive sleep apnea on CPAP.  At the last visit he was instructed to restart CPAP. Since restarting energy level is better and BP is better. DL is better    7/76/76: Mr. Paul Moon is a 78 year old male with a history of memory disturbance.  He returns today for follow-up.  He reports that his memory has remained stable.  Reports that his memory has improved with Namenda .  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  No difficulty with maintaining his job.  Able to complete all ADLs independently.  Reports he has not been using his CPAP for over 6 months.  However after we discussed the risk associated with untreated sleep apnea he states that he will be restarting his CPAP.  Had trouble with his mask i which led him to stop using it.  Returns today for an evaluation  11/15/19: Mr. Paul Moon is a 78 year old male with a history of memory disturbance.  He returns today for follow-up.  He reports overall he has been stable.  He is able to complete all ADLs independently.  He operates a Librarian, academic without difficulty.  He manages his own finances.  He reports that he is semiretired.  Continues to work part-time.  He returns today for an evaluation.  HISTORY  11/24/18:   [name] is a 78 year old male with a history of memory loss.  He returns today for follow-up.  He reports that his memory has been stable.  He is able to complete all ADLs independently.  He manages his own finances without difficulty.  He does not do any cooking but this has been the norm for him.  Denies any changes in his mood or behavior.  He states that his significant other feels that his memory is worse.  He states that if he forgets to get something at the grocery store she considers that a problem with his memory.  However he states that sometimes he is just preoccupied with other things and does not always process what she is asking.  He continues  on Namenda  and Aricept .  Tolerates these medications well.  Returns today for an evaluation.  REVIEW OF SYSTEMS: Out of a complete 14 system review of symptoms, the patient complains only of the following symptoms, and all other reviewed systems are negative.  See HPI  ALLERGIES: Allergies  Allergen Reactions   Lisinopril Cough   Hctz [Hydrochlorothiazide]     Unknown reaction   Lactose Intolerance (Gi)    Quetiapine Fumarate Er Other (See Comments)   Risperdal [Risperidone] Other (See Comments)    not be lucid, be foggy   Seroquel [Quetiapine Fumarate] Other (See Comments)    Joint swelling   Seroquel [Quetiapine] Other (See Comments)    HOME MEDICATIONS: Outpatient Medications Prior to Visit  Medication Sig Dispense Refill   allopurinol  (ZYLOPRIM ) 300 MG tablet Take 300 mg by mouth daily.     AMBULATORY NON FORMULARY MEDICATION 0.2 mLs by Intracavernosal route as needed. Medication Name: Trimix  PGE 30mcg Pap 30mg  Phent 1mg  5 mL 5   amLODipine (NORVASC) 10 MG tablet Take 10 mg by mouth daily.     amoxicillin  (AMOXIL ) 250 MG capsule Take 1 capsule (250 mg total) by mouth 3 (three) times daily. (Patient not taking: Reported on 05/05/2023) 21 capsule 0   cyclobenzaprine  (FLEXERIL ) 5 MG tablet Take 1 tablet (5 mg total) by mouth 3 (three) times daily as needed for muscle spasms. (Patient not taking: Reported on 05/21/2023) 30 tablet 0   donepezil  (ARICEPT ) 10 MG tablet Take 1 tablet (10 mg total) by mouth at bedtime. 7 tablet 0   finasteride  (PROSCAR ) 5 MG tablet Take 1 tablet (5 mg total) by mouth daily. 90 tablet 3   losartan  (COZAAR ) 100 MG tablet Take 100 mg by mouth at bedtime.      magnesium oxide (MAG-OX) 400 MG tablet Take 400 mg by mouth daily.     memantine  (NAMENDA ) 10 MG tablet Take 1 tablet (10 mg total) by mouth 2 (two) times daily. Must keep follow up 12/04/2020 for ongoing refills 180 tablet 1   mirabegron  ER (MYRBETRIQ ) 50 MG TB24 tablet Take 1 tablet (50 mg total)  by mouth daily. 90 tablet 3   mirabegron  ER (MYRBETRIQ ) 50 MG TB24 tablet Take 1 tablet (50 mg total) by mouth daily. 30 tablet 11   tamsulosin  (FLOMAX ) 0.4 MG CAPS capsule Take 1 capsule (0.4 mg total) by mouth daily. 90 capsule 3   traZODone (DESYREL) 100 MG tablet Take 100 mg by mouth at bedtime.     traZODone (DESYREL) 50 MG tablet Take 100 mg by mouth at bedtime.     No facility-administered medications prior to visit.    PAST MEDICAL HISTORY: Past Medical History:  Diagnosis Date   Arthritis  INDEX FINGER JOINT LEFT HAND, right knee   Bipolar disorder (HCC)    Depression    History of gout 2018   right hand 2 fingers and right wrist-- 05-20-2017 per pt resolved   Hyperplasia of prostate with lower urinary tract symptoms (LUTS)    Hypertension    OSA on CPAP    per study 03-22-2013  moderate OSA w/ AHI 26.8/hr   Pinched cervical nerve root    PT STATES C 7-8 PINCHED NERVE CAUSING NUMBNESS MIDDLE, RING AND LITTLE FINGER LEFT HAND - NO PAIN - STATES HE IS TRYING TO AVOID SURGERY.   Pre-diabetes    followed by pcp   Prostate cancer [facility]) urologist-  dr thalia  oncologist-  dr patrcia   dx 05-24-2016  Stage T1c, Gleason 4+3, PSA 5.49, vol 65.5cc/  pt seek second opinion's at [facility] and [facility] not candidate for focal laser ablation/  scheduled for radiactive seed implants 05-26-2017   Wears glasses     PAST SURGICAL HISTORY: Past Surgical History:  Procedure Laterality Date   CIRCUMCISION  1973 approx.   KNEE ARTHROSCOPY Left 04/28/2013   Procedure: LEFT ARTHROSCOPY KNEE WITH DEBRIDEMENT meniscal debridement;  Surgeon: Dempsey LULLA Moan, MD;  Location: [facility];  Service: Orthopedics;  Laterality: Left;   KNEE SURGERY Right 2008   left knee replacment      2023   MR GUIDED PROSTATE BIOPSY  12-12-2016    [facility] [place])   w/ general anesthesia   PROSTATE BIOPSY  05-29-2016   dr sherrilee office   RADIOACTIVE SEED IMPLANT N/A 05/26/2017   Procedure:  RADIOACTIVE SEED IMPLANT/BRACHYTHERAPY IMPLANT;  Surgeon: sherrilee Belvie CROME, MD;  Location: [facility];  Service: Urology;  Laterality: N/A;   SPACE OAR INSTILLATION N/A 05/26/2017   Procedure: SPACE OAR INSTILLATION;  Surgeon: sherrilee Belvie CROME, MD;  Location: [facility];  Service: Urology;  Laterality: N/A;   TONSILLECTOMY  1970s   TOTAL KNEE ARTHROPLASTY N/A 08/23/2019   Procedure: TOTAL KNEE ARTHROPLASTY;  Surgeon: Moan Dempsey, MD;  Location: [facility];  Service: Orthopedics;  Laterality: N/A;    FAMILY HISTORY: Family History  Problem Relation Age of Onset   Renal cancer Father    Cancer Paternal Aunt        breast   Heart attack Paternal Grandmother    Heart attack Paternal Grandfather    Sleep apnea Neg Hx     SOCIAL HISTORY: Social History   Socioeconomic History   Marital status: Divorced    Spouse name: Not on file   Number of children: 2   Years of education: College   Highest education level: Not on file  Occupational History   Occupation: Semi-Retired  Tobacco Use   Smoking status: Former    Current packs/day: 0.00    Average packs/day: 1.5 packs/day for 20.0 years (30.0 ttl pk-yrs)    Types: Cigarettes    Start date: 05/26/1965    Quit date: 05/26/1985    Years since quitting: 38.5   Smokeless tobacco: Never  Vaping Use   Vaping status: Never Used  Substance and Sexual Activity   Alcohol use: Yes    Comment: occasional wine   Drug use: No   Sexual activity: Yes  Other Topics Concern   Not on file  Social History Narrative   Patient lives at home alone.   Caffeine Use: occasionally   Social Drivers of Corporate investment banker Strain: Not on [employer]  Insecurity: Not on file  Transportation Needs: Not on file  Physical Activity: Not on file  Stress: Not on file  Social Connections: Not on file  Intimate Partner Violence: Not on file      PHYSICAL EXAM  There were no vitals filed for this  visit.     There is no height or weight on file to calculate BMI.      05/05/2023   10:50 AM 10/02/2022   10:30 AM 03/21/2022    2:46 PM  MMSE - Mini Mental State Exam  Orientation to time 5 5 5   Orientation to Place 5 5 5   Registration 3 3 3   Attention/ Calculation 5 5 5   Recall 3 3 3   Language- name 2 objects 2 2 2   Language- repeat 1 1 1   Language- follow 3 step command 3 3 3   Language- read & follow direction 1 1 1   Write a sentence 1 1 1   Copy design 1 1 0  Total score 30 30 29      Generalized: Well developed, in no acute distress   Neurological examination  Mentation: Alert oriented to time, place, history taking. Follows all commands speech and language fluent Cranial nerve II-XII: Facial symmetry noted Gait and station: Gait is normal.     DIAGNOSTIC DATA (LABS, IMAGING, TESTING) - I reviewed patient records, labs, notes, testing and imaging myself where available.  Lab Results  Component Value Date   WBC 8.1 08/24/2019   HGB 11.9 (L) 08/24/2019   HCT 35.6 (L) 08/24/2019   MCV 93.0 08/24/2019   PLT 248 08/24/2019      Component Value Date/Time   NA 134 (L) 08/24/2019 0301   K 4.4 08/24/2019 0301   CL 101 08/24/2019 0301   CO2 24 08/24/2019 0301   GLUCOSE 150 (H) 08/24/2019 0301   BUN 13 08/24/2019 0301   CREATININE 1.70 (H) 10/31/2020 1654   CALCIUM 8.6 (L) 08/24/2019 0301   PROT 7.4 08/16/2019 0844   ALBUMIN 4.0 08/16/2019 0844   AST 23 08/16/2019 0844   ALT 23 08/16/2019 0844   ALKPHOS 73 08/16/2019 0844   BILITOT 0.7 08/16/2019 0844   GFRNONAA 58 (L) 08/24/2019 0301   GFRAA >60 08/24/2019 0301    Lab Results  Component Value Date   TSH 2.620 03/22/2013      ASSESSMENT AND PLAN 78 y.o. year old male  has a past medical history of Arthritis, Bipolar disorder (HCC), Depression, History of gout (2018), Hyperplasia of prostate with lower urinary tract symptoms (LUTS), Hypertension, OSA on CPAP, Pinched cervical nerve root, Pre-diabetes,  Prostate cancer [facility]) (urologist-  dr thalia  oncologist-  dr patrcia), and Wears glasses. here with:  1.  Memory disturbance  Continue Aricept  10 mg at bedtime Continue Namenda  10mg  BID MMSE 30/30   2.  Obstructive sleep apnea on CPAP  Good Compliance Residual AHI in normal range Encourage patient to continue using CPAP nightly and greater than 4 hours each night Home sleep test ordered pending results we will order new machine   FU in 1 year or sooner if needed  Duwaine Russell, MSN, NP-C 11/24/2023, 1:23 PM [facility] [address], [address] [place], [place] [address] [phone]

## 2023-11-25 ENCOUNTER — Ambulatory Visit (INDEPENDENT_AMBULATORY_CARE_PROVIDER_SITE_OTHER): Admitting: Adult Health

## 2023-11-25 ENCOUNTER — Encounter: Payer: Self-pay | Admitting: Adult Health

## 2023-11-25 VITALS — BP 133/68 | HR 69 | Ht 68.0 in | Wt 207.0 lb

## 2023-11-25 DIAGNOSIS — R413 Other amnesia: Secondary | ICD-10-CM

## 2023-11-25 DIAGNOSIS — G4733 Obstructive sleep apnea (adult) (pediatric): Secondary | ICD-10-CM

## 2023-11-25 NOTE — Patient Instructions (Signed)
 Your Plan:  Continue using CPAP nightly Will check blood work today for memory disturbance     Thank you for coming to see us  at [facility]. I hope we have been able to provide you high quality care today.  You may receive a patient satisfaction survey over the next few weeks. We would appreciate your feedback and comments so that we may continue to improve ourselves and the health of our patients.

## 2023-11-25 NOTE — Progress Notes (Signed)
 PATIENT: Paul Moon DOB: 26-Dec-1945  REASON FOR VISIT: follow up HISTORY FROM: patient  Chief Complaint  Patient presents with   Rm 4    Patient is here alone for cpap and memory follow-up.      HISTORY OF PRESENT ILLNESS: Today 11/25/23:  Paul Moon is a 78 y.o. male with a history of OSA on CPAP and memory disturbance. Returns today for follow-up.  In regards to CPAP finds that it still working well.  He denies any new issues.  Had a home sleep test in August of last year that showed mild sleep apnea.  In regards to his memory he states that his ex-wife feels that it is worse.  He states that they recently went to a concert and he took the long route back home.  However he states that he has not lived in [place] in 20 years therefore he was not familiar with some of the roads.  He states that he lost his wallet 2 weeks ago.  He states that he can be told to do things and he forgets to do them.  He is able to complete all ADLs independently.  He manages his own medications appointments and finances.  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  He remains on Aricept  and Namenda .  Returns today for an evaluation.      11/25/23: Paul Moon is a 78 y.o. male with a history of OSA on CPAP and memory disturbance. Returns today for follow-up. Reports that he has two machines. Some of the missed nights is when he was using his old machine.  Download is below  Memory: memory is better.  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  Able to complete all ADLs independently.  Currently attending college to get his undergraduate.  Remains on Aricept  and Namenda     10/02/22: Paul Moon is a 78 y.o. male with a history of memory disturbance and OSA on CPAP. Returns today for follow-up.   OSA on CPAP: working well but humidification is not working. Setup date was in 2015. Would like a new machine.  He states that once he gets up to go to the bathroom he usually takes the machine off.  May sleep another  1-1/2 hours before he gets up.  Memory: has trouble focusing which affects his ability to get things done. This seemed to occur when he was not taking trazodone. He is back on his regular med schedule and now things seem to be better.  He remains on Aricept  and Namenda .     03/21/22: Paul Moon is a 78 y.o. male with a history of Memory Disturbance and OSA on CPAP. Returns today for follow-up. Reports that he was playing golf with someone he plays with reguarly but couldn't remember his name for about 3 hours but then he was able to recall it. He reports that 2 months ago started having pain left side of head orginally just at night when he would wake up but now feels it during the day. Short lived sharp pain 2/10 pain scale.  Starting to happen more frequently. Now happening about twice a week.  typically he is taking medication as events are very short  Reports that CPAP is working well for him.  Denies any new issues.  Download is below    5/22/2023[name] is a 78 year old male with a history of memory disturbance and obstructive sleep apnea on CPAP.  At the last visit he was  instructed to restart CPAP. Since restarting energy level is better and BP is better. DL is better    7/76/76: Paul. Current is a 78 year old male with a history of memory disturbance.  He returns today for follow-up.  He reports that his memory has remained stable.  Reports that his memory has improved with Namenda .  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  No difficulty with maintaining his job.  Able to complete all ADLs independently.  Reports he has not been using his CPAP for over 6 months.  However after we discussed the risk associated with untreated sleep apnea he states that he will be restarting his CPAP.  Had trouble with his mask i which led him to stop using it.  Returns today for an evaluation  11/15/19: Paul. Moon is a 78 year old male with a history of memory disturbance.  He returns today for follow-up.   He reports overall he has been stable.  He is able to complete all ADLs independently.  He operates a Librarian, academic without difficulty.  He manages his own finances.  He reports that he is semiretired.  Continues to work part-time.  He returns today for an evaluation.  HISTORY 11/24/18:   Paul Moon is a 78 year old male with a history of memory loss.  He returns today for follow-up.  He reports that his memory has been stable.  He is able to complete all ADLs independently.  He manages his own finances without difficulty.  He does not do any cooking but this has been the norm for him.  Denies any changes in his mood or behavior.  He states that his significant other feels that his memory is worse.  He states that if he forgets to get something at the grocery store she considers that a problem with his memory.  However he states that sometimes he is just preoccupied with other things and does not always process what she is asking.  He continues on Namenda  and Aricept .  Tolerates these medications well.  Returns today for an evaluation.  REVIEW OF SYSTEMS: Out of a complete 14 system review of symptoms, the patient complains only of the following symptoms, and all other reviewed systems are negative.  See HPI  ALLERGIES: Allergies  Allergen Reactions   Lisinopril Cough   Hctz [Hydrochlorothiazide]     Unknown reaction   Lactose Intolerance (Gi)    Quetiapine Fumarate Er Other (See Comments)   Risperdal [Risperidone] Other (See Comments)    not be lucid, be foggy   Seroquel [Quetiapine Fumarate] Other (See Comments)    Joint swelling   Seroquel [Quetiapine] Other (See Comments)    HOME MEDICATIONS: Outpatient Medications Prior to Visit  Medication Sig Dispense Refill   allopurinol  (ZYLOPRIM ) 300 MG tablet Take 300 mg by mouth daily.     AMBULATORY NON FORMULARY MEDICATION 0.2 mLs by Intracavernosal route as needed. Medication Name: Trimix  PGE 30mcg Pap 30mg  Phent 1mg  5 mL 5    amLODipine (NORVASC) 10 MG tablet Take 10 mg by mouth daily.     cyclobenzaprine  (FLEXERIL ) 5 MG tablet Take 1 tablet (5 mg total) by mouth 3 (three) times daily as needed for muscle spasms. 30 tablet 0   donepezil  (ARICEPT ) 10 MG tablet Take 1 tablet (10 mg total) by mouth at bedtime. 7 tablet 0   finasteride  (PROSCAR ) 5 MG tablet Take 1 tablet (5 mg total) by mouth daily. 90 tablet 3   losartan  (COZAAR ) 100 MG tablet Take 100 mg  by mouth at bedtime.      magnesium oxide (MAG-OX) 400 MG tablet Take 400 mg by mouth daily.     memantine  (NAMENDA ) 10 MG tablet Take 1 tablet (10 mg total) by mouth 2 (two) times daily. Must keep follow up 12/04/2020 for ongoing refills 180 tablet 1   tamsulosin  (FLOMAX ) 0.4 MG CAPS capsule Take 1 capsule (0.4 mg total) by mouth daily. 90 capsule 3   traZODone (DESYREL) 100 MG tablet Take 100 mg by mouth at bedtime.     amoxicillin  (AMOXIL ) 250 MG capsule Take 1 capsule (250 mg total) by mouth 3 (three) times daily. (Patient not taking: Reported on 05/05/2023) 21 capsule 0   mirabegron  ER (MYRBETRIQ ) 50 MG TB24 tablet Take 1 tablet (50 mg total) by mouth daily. (Patient not taking: Reported on 11/25/2023) 30 tablet 11   mirabegron  ER (MYRBETRIQ ) 50 MG TB24 tablet Take 1 tablet (50 mg total) by mouth daily. 90 tablet 3   traZODone (DESYREL) 50 MG tablet Take 100 mg by mouth at bedtime.     No facility-administered medications prior to visit.    PAST MEDICAL HISTORY: Past Medical History:  Diagnosis Date   Arthritis    INDEX FINGER JOINT LEFT HAND, right knee   Bipolar disorder (HCC)    Depression    History of gout 2018   right hand 2 fingers and right wrist-- 05-20-2017 per pt resolved   Hyperplasia of prostate with lower urinary tract symptoms (LUTS)    Hypertension    OSA on CPAP    per study 03-22-2013  moderate OSA w/ AHI 26.8/hr   Pinched cervical nerve root    PT STATES C 7-8 PINCHED NERVE CAUSING NUMBNESS MIDDLE, RING AND LITTLE FINGER LEFT HAND - NO  PAIN - STATES HE IS TRYING TO AVOID SURGERY.   Pre-diabetes    followed by pcp   Prostate cancer [facility]) urologist-  dr thalia  oncologist-  dr patrcia   dx 05-24-2016  Stage T1c, Gleason 4+3, PSA 5.49, vol 65.5cc/  pt seek second opinion's at [facility] and [facility] not candidate for focal laser ablation/  scheduled for radiactive seed implants 05-26-2017   Wears glasses     PAST SURGICAL HISTORY: Past Surgical History:  Procedure Laterality Date   CIRCUMCISION  1973 approx.   KNEE ARTHROSCOPY Left 04/28/2013   Procedure: LEFT ARTHROSCOPY KNEE WITH DEBRIDEMENT meniscal debridement;  Surgeon: Dempsey LULLA Moan, MD;  Location: [facility];  Service: Orthopedics;  Laterality: Left;   KNEE SURGERY Right 2008   left knee replacment      2023   Paul GUIDED PROSTATE BIOPSY  12-12-2016    [facility] [place], [place])   w/ general anesthesia   PROSTATE BIOPSY  05-29-2016   dr sherrilee office   RADIOACTIVE SEED IMPLANT N/A 05/26/2017   Procedure: RADIOACTIVE SEED IMPLANT/BRACHYTHERAPY IMPLANT;  Surgeon: sherrilee Belvie CROME, MD;  Location: [facility];  Service: Urology;  Laterality: N/A;   SPACE OAR INSTILLATION N/A 05/26/2017   Procedure: SPACE OAR INSTILLATION;  Surgeon: sherrilee Belvie CROME, MD;  Location: [facility];  Service: Urology;  Laterality: N/A;   TONSILLECTOMY  1970s   TOTAL KNEE ARTHROPLASTY N/A 08/23/2019   Procedure: TOTAL KNEE ARTHROPLASTY;  Surgeon: Moan Dempsey, MD;  Location: [facility];  Service: Orthopedics;  Laterality: N/A;    FAMILY HISTORY: Family History  Problem Relation Age of Onset   Renal cancer Father    Cancer Paternal Aunt  breast   Heart attack Paternal Grandmother    Heart attack Paternal Grandfather    Sleep apnea Neg Hx     SOCIAL HISTORY: Social History   Socioeconomic History   Marital status: Divorced    Spouse name: Not on file   Number of children: 2   Years of education: College   Highest education level: Not on  file  Occupational History   Occupation: Semi-Retired  Tobacco Use   Smoking status: Former    Current packs/day: 0.00    Average packs/day: 1.5 packs/day for 20.0 years (30.0 ttl pk-yrs)    Types: Cigarettes    Start date: 05/26/1965    Quit date: 05/26/1985    Years since quitting: 38.5   Smokeless tobacco: Never  Vaping Use   Vaping status: Never Used  Substance and Sexual Activity   Alcohol use: Yes    Comment: occasional wine   Drug use: No   Sexual activity: Yes  Other Topics Concern   Not on file  Social History Narrative   Patient lives at home alone.   Caffeine Use: occasionally   Social Drivers of Corporate investment banker Strain: Not on file  Food Insecurity: Not on file  Transportation Needs: Not on file  Physical Activity: Not on file  Stress: Not on file  Social Connections: Not on file  Intimate Partner Violence: Not on file      PHYSICAL EXAM  Vitals:   11/25/23 0839  BP: 133/68  Pulse: 69  Weight: 207 lb (93.9 kg)  Height: 5' 8 (1.727 m)       Body mass index is 31.47 kg/m.      05/05/2023   10:50 AM 10/02/2022   10:30 AM 03/21/2022    2:46 PM  MMSE - Mini Mental State Exam  Orientation to time 5 5 5   Orientation to Place 5 5 5   Registration 3 3 3   Attention/ Calculation 5 5 5   Recall 3 3 3   Language- name 2 objects 2 2 2   Language- repeat 1 1 1   Language- follow 3 step command 3 3 3   Language- read & follow direction 1 1 1   Write a sentence 1 1 1   Copy design 1 1 0  Total score 30 30 29      Generalized: Well developed, in no acute distress   Neurological examination  Mentation: Alert oriented to time, place, history taking. Follows all commands speech and language fluent Cranial nerve II-XII: Facial symmetry noted Gait and station: Gait is normal.     DIAGNOSTIC DATA (LABS, IMAGING, TESTING) - I reviewed patient records, labs, notes, testing and imaging myself where available.  Lab Results  Component Value Date    WBC 8.1 08/24/2019   HGB 11.9 (L) 08/24/2019   HCT 35.6 (L) 08/24/2019   MCV 93.0 08/24/2019   PLT 248 08/24/2019      Component Value Date/Time   NA 134 (L) 08/24/2019 0301   K 4.4 08/24/2019 0301   CL 101 08/24/2019 0301   CO2 24 08/24/2019 0301   GLUCOSE 150 (H) 08/24/2019 0301   BUN 13 08/24/2019 0301   CREATININE 1.70 (H) 10/31/2020 1654   CALCIUM 8.6 (L) 08/24/2019 0301   PROT 7.4 08/16/2019 0844   ALBUMIN 4.0 08/16/2019 0844   AST 23 08/16/2019 0844   ALT 23 08/16/2019 0844   ALKPHOS 73 08/16/2019 0844   BILITOT 0.7 08/16/2019 0844   GFRNONAA 58 (L) 08/24/2019 0301   GFRAA >60 08/24/2019  0301    Lab Results  Component Value Date   TSH 2.620 03/22/2013      ASSESSMENT AND PLAN 78 y.o. year old male  has a past medical history of Arthritis, Bipolar disorder (HCC), Depression, History of gout (2018), Hyperplasia of prostate with lower urinary tract symptoms (LUTS), Hypertension, OSA on CPAP, Pinched cervical nerve root, Pre-diabetes, Prostate cancer [facility]) (urologist-  dr thalia  oncologist-  dr patrcia), and Wears glasses. here with:  1.  Memory disturbance  Continue Aricept  10 mg at bedtime Continue Namenda  10mg  BID MMSE,29/30 We will check blood work-ATN and APO E Advised that if blood work is unremarkable we will consider an evaluation by neuropsych for his memory.   2.  Obstructive sleep apnea on CPAP  Good Compliance Residual AHI in normal range Encourage patient to continue using CPAP nightly and greater than 4 hours each night Home sleep test ordered pending results we will order new machine   FU in 1 year or sooner if needed  Duwaine Russell, MSN, NP-C 11/25/2023, 8:44 AM [facility] [address], [address] [place], [place] [address] [phone]

## 2023-12-01 ENCOUNTER — Ambulatory Visit: Payer: Self-pay | Admitting: Adult Health

## 2023-12-01 DIAGNOSIS — R413 Other amnesia: Secondary | ICD-10-CM

## 2023-12-02 ENCOUNTER — Telehealth: Payer: Self-pay | Admitting: Adult Health

## 2023-12-02 NOTE — Telephone Encounter (Signed)
 [facility] Phone: [phone], Fax: [phone]

## 2023-12-03 LAB — APOE ALZHEIMER'S RISK

## 2023-12-03 LAB — ATN PROFILE
A -- Beta-amyloid 42/40 Ratio: 0.108 (ref >0.102)
Beta-amyloid 40: 213.36 pg/mL
Beta-amyloid 42: 22.97 pg/mL
N -- NfL, Plasma: 3.96 pg/mL (ref 0.00–6.04)
T -- p-tau181: 1.38 pg/mL — ABNORMAL HIGH (ref 0.00–0.97)

## 2024-01-14 ENCOUNTER — Encounter: Payer: Self-pay | Admitting: Psychology

## 2024-02-24 ENCOUNTER — Encounter: Admitting: Psychology

## 2024-02-24 DIAGNOSIS — R4189 Other symptoms and signs involving cognitive functions and awareness: Secondary | ICD-10-CM | POA: Diagnosis present

## 2024-02-24 NOTE — Progress Notes (Signed)
 "  NEUROPSYCHOLOGICAL EVALUATION [facility]  Physical Medicine and Rehabilitation     Patient: Paul Moon  DOB: 05-18-1945  Age: 78 y.o. Sex: male  Race/Ethnicity: Black or African American  Years of Ed.: 13  Referring Provider: Sherryl Bouchard, NP  Provider / Neuropsychologist: Evalene DOROTHA Riff, PsyD Date of Service: 02/24/24 Start: 8:05 AM End: 10:40 AM Location of Service:  [place] Physical Medicine & Rehabilitation Department [address], [place], [place] [address] Individuals Present: Patient was seen unaccompanied, in-person, by the provider. 1 hour and 45 minutes spent in face-to-face clinical interview and remaining 55 minutes was spent in record review, documentation, and testing protocol construction.   Billing Code/Service: [id number] (1 Unit), [phone] (1 Unit)  PATIENT CONSENT AND CONFIDENTIALITY The patient's understanding of the reason for referral was intact. Discussed limits of confidentiality including, but not limited to, posting of final evaluation report in the patient's electronic medical record for both the patient and for the referring provider and appropriate medical professionals. Patient was given the opportunity to have their questions answered. The neuropsychological evaluation process was discussed with the patient and they consented to proceed with the evaluation.  Consent for Evaluation and Treatment: Signed: Yes Explanation of Privacy Policies: Signed: Yes Discussion of Confidentiality Limits: Yes  REASON FOR REFERRAL & RECORD REVIEW The patient was referred for neuropsychological evaluation by his provider, Bouchard Sherryl, NP, on 12/01/2023 who met with the patient for neurology follow-up.  The patient has history of OSA (cPAP compliant), arthritis, prostate cancer (s/p radiation Tx 2019), depression, Pinched cervical nerve root, Pre-diabetes, and hypertension per records.  Difficulties  with memory reported during a follow-up with neurology in 2020.  He was already placed on Namenda  and Aricept  by that time.  Memory difficulties are described primarily as stable and subsequent follow-ups.  September 2025 visit indicated memory is improved.  He is able to complete all ADLs independently.  However, the patient reported that his wife felt his memory issues were getting worse.  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  MMSE score was a 29 out of 30 on that visit, consistent with scores during the prior three follow-up visits conducted roughly 6 months apart (30, 30, 29).  Upon interview, the patient expressed interest in undergoing the evaluation although offered that he suspected there was an ADHD diagnosis.  The provider discussed purpose of the evaluation and offered that the primary reason for the referral appeared to be related to concerns for cognitive decline.  The provider offered that indications of ADHD could be explored but detailed the limitations of establishing a neurodevelopmental disorder and late life.  The patient acknowledged that he did have some concerns about cognitive decline and was willing to proceed.  He expressed interest in undergoing the evaluation to better understand the nature of his cognitive difficulties and to learn what may be done to help manage them.  HISTORY OF PRESENTING CONCERNS Cognitive Symptom Onset & Course: The patient reported cognitive changes in the last five to seven years.  Onset was reportedly slow and course was described as progressive.  Difficulties were primarily related to motivation and initiation and with concentration/focus.  Current Cognitive Complaints  Memory:  The patient indicated that short-term memory is not the greatest.  He indicated that he regularly forgets why he entered a store.  He feels that his attention within conversations depends much on his focus  and engagement  level.  He indicated that he is always had some difficulty with remembering conversations and forgetfulness in general but feels like this has worsened over time.  Her descriptions were not compelling for topographical disorientation. Processing Speed:  Patient was uncertain as to whether or not there have been any changes in his thinking speed. Attention & Concentration: The patient described worsening of premorbid difficulties with distractibility, sustained attention, and other aspects of attention and concentration.  Language:  No clear indications of word-finding difficulties.  No problems auditory-verbal comprehension overall.  He is not as fast with mental arithmetic is otherwise intact.  Visual-Spatial:  No indications of changes in visual-spatial abilities. Executive Functioning:  Worsening of premorbid difficulties with organization.  Patient indicate he is able to outline a plan but is having more difficulty following through that in the past.  He denied any significant current or past difficulties with impulsivity.  The patient described reduced confidence in his abilities which may be contributing to slight social withdrawal.  Level of Functional Independence  Basic activities of daily living: Intact Finances: No difficulties remembering to pay his bills although has many on autopay. Shopping / Meal Preparation: Intact. Household Maintenance / Chores: Intact.    Tracking Appointments / Future Obligations: Intact.    Medication Management: Intact.  Has utilized a pill organizer for about 5 years.      Driving: No major changes on accounted for by setting.  Motor/Sensory Complaints Sensory changes: Smell and taste are unchanged.  Some tinnitus in left ear but no significant hearing difficulties.  Wears glasses but vision is otherwise good.  Balance/coordination difficulties: The patient offered that his balance is good as long as he continues to exercise. Has started Tai Chi. No  frequent falls. Walking and mobility is intact. Frequent instances of dizziness/vertigo: No problems vertigo. No current lightheadedness issues.  Other motor difficulties: No tremors. Handwriting roughly unchanged from baseline.   Emotional and Behavioral Functioning:  Depression Symptomatology: Denied difficulties with persistent low mood or anhedonia in recent years. He described onset of some depressive symptoms around 2006 which persisted for a few years following divorce.  No current or past suicidal ideation. Anxiety Symptomatology:  Anxiety medication at one point, after d/c bipolar meds (~10 years ago) which he was unable to tolerate. Brief interview for Bipolar related symptom history was negative.  The patient endorsed some occasional worry but does not feel that is necessarily excessive.  He denied difficulties with relaxing or frequent feelings of tension. Years ago may have had a panic attack. No major issues with respect to anxiety currently. He does have some fairly infrequent situationally related rumination that can make it difficult for him to return to sleep if waking during the night   Other Symptomatology: No clear PTSD.  He was exposed to potentially traumatic events in his environment growing up but he denied any indications of PTSD. No Hallucinations or paranoia, no indications of manic episode. No inpatient psychiatric stays.  Sleep: Typically 6-9. Uses CPAP reliably. Feel rested in morning. No major issues falling asleep. No problems returning to sleep if waking during the night, typically.   Appetite: Appetite is good. Taking monjuro (prediabetic). Been on four months.  Caffeine: Not frequently, maybe a cup every few days.   Alcohol Use: Infrequent. Tobacco Use: Remote. Recreational Substance Use: Remote.  Medical History/Record Review: Per records and patient report, History of traumatic brain injury/concussion: Multiple times hit playing football and knocked out. Maybe 3  times.  No clear residual or persistent changes. History of stroke: No  History of heart attack: No  History of cancer/chemotherapy: No chemo. Radiation prostate.    History of seizure activity: No   Symptoms of chronic pain: No   Experience of frequent headaches/migraines: No   Imaging/Lab Results:  04/03/2022 MR BRAIN WO CONTRAST FINDINGS: The brain parenchyma shows scattered periventricular and subcortical and subtle brainstem T2/FLAIR white matter hyperintensities compatible with changes of age-related chronic small vessel disease.  These appear slightly more advanced compared to previous MRI from 08/14/2017.  There is mild degree of age-appropriate generalized cerebral atrophy.  No structural lesions, tumors or infarcts are noted.  The subarachnoid space and ventricular system show slight dilatation.  Cortical sulci and gyri appear normal.  Extra-axial brain structures appear normal.  Calvarium shows no abnormalities.  Orbits appear unremarkable.  The paranasal sinuses show mild chronic inflammatory changes.  The pituitary gland and cerebellar tonsils appear normal.  The visualized portion of the upper cervical spine shows mild disc degenerative changes.  Flow-voids of large vessels are intact and circulation appear to be patent.  IMPRESSION: MRI scan of brain without contrast showing chronic changes of small vessel disease which appears slightly more advanced compared with previous MRI from 08/14/2017.  There are no acute abnormalities. 11/25/23 ATN A- T+ N- 11/25/23 APOE E3/E3  Past Medical History:  Diagnosis Date   Arthritis    INDEX FINGER JOINT LEFT HAND, right knee   Bipolar disorder (HCC)    Depression    History of gout 2018   right hand 2 fingers and right wrist-- 05-20-2017 per pt resolved   Hyperplasia of prostate with lower urinary tract symptoms (LUTS)    Hypertension    OSA on CPAP    per study 03-22-2013  moderate OSA w/ AHI 26.8/hr   Pinched cervical nerve root    PT STATES  C 7-8 PINCHED NERVE CAUSING NUMBNESS MIDDLE, RING AND LITTLE FINGER LEFT HAND - NO PAIN - STATES HE IS TRYING TO AVOID SURGERY.   Pre-diabetes    followed by pcp   Prostate cancer [facility] [address]) urologist-  dr thalia  oncologist-  dr patrcia   dx 05-24-2016  Stage T1c, Gleason 4+3, PSA 5.49, vol 65.5cc/  pt seek second opinion's at [facility] and [facility] not candidate for focal laser ablation/  scheduled for radiactive seed implants 05-26-2017   Wears glasses    Patient Active Problem List   Diagnosis Date Noted   Depression 02/14/2021   Frequency of micturition 05/09/2020   OAB (overactive bladder) 01/20/2020   Erectile dysfunction due to arterial insufficiency 01/20/2020   OA (osteoarthritis) of knee 08/23/2019   Primary osteoarthritis of right knee 08/23/2019   Mild cognitive impairment 08/07/2017   Prostate cancer (HCC) 06/18/2016   Meralgia paresthetica of right side 02/18/2014   Acute medial meniscal tear 04/27/2013   Obstructive sleep apnea 04/09/2013   Tremor 03/22/2013   Headache 03/22/2013   Cervical radiculopathy 07/10/2011   Family Neurologic/Medical Hx:  Family History  Problem Relation Age of Onset   Renal cancer Father    Cancer Paternal Aunt        breast   Heart attack Paternal Grandmother    Heart attack Paternal Grandfather    Sleep apnea Neg Hx    Medications:  allopurinol  (ZYLOPRIM ) 300 MG tablet AMBULATORY NON FORMULARY MEDICATION amLODipine (NORVASC) 10 MG tablet amoxicillin  (AMOXIL ) 250 MG capsule cyclobenzaprine  (FLEXERIL ) 5 MG tablet donepezil  (ARICEPT ) 10 MG tablet finasteride  (PROSCAR ) 5 MG tablet  losartan  (COZAAR ) 100 MG tablet magnesium oxide (MAG-OX) 400 MG tablet memantine  (NAMENDA ) 10 MG tablet mirabegron  ER (MYRBETRIQ ) 50 MG TB24 tablet tamsulosin  (FLOMAX ) 0.4 MG CAPS capsule traZODone (DESYREL) 100 MG tablet  (For sleep, ~three years).   Academic/Vocational History: Pretty good grades in school growing up.  He graduated with honors from  high school. Went on to college. Dropped out after a year.  He describes difficulties with avoidance and focus in college, and priorities.  He reported he back to school multiple times, but failed every time. He indicated he would get distracted by other things, like working. 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 ADHD Symptomatology:  -Endorsements were based on patient self report during interview.  -Symptoms indicated as currently present (adult): Present at least 6 months but also appear chronic/trait-like.  -Symptoms indicated as present during childhood: Present by 78 years of age, are inconsistent with developmental level.  Attention-Deficit Symptoms: Often difficulties with.. Current Childhood  A1a Attention to detail/careless mistakes:  No No  A1b Sustained attention:  Variable Unclear  A1c Attending when addressed directly:  No No  A1d Follow through on instructions / tasks:   Yes Yes  A1e Organizational difficulty:  Yes Yes  A28f Aversion / avoidance of sustained mental effort:  Yes Yes  A1g Losing things necessary for tasks/activities:  Unclear Unclear/No  A1h Easily distracted by extraneous stimuli:  Yes Yes  A1i Forgetful in daily activities:  Yes Yes  Hyperactivity/Impulsivity Symptoms: Often difficulties with    A2a Fidgeting/squirming in seat: No No  A2b Remaining seated / needing to move around: No No  A2c Restlessness/runs or climbs when inappropriate:  No No  A2d  Engaging in leisure activities quietly: ** No No  A2e Excessive energy:  No No  A56f Talking excessively:  No No  A2g Blurting out answers / interrupting others:  No No  A2h Waiting turn:  No No  A2i Interruption or socially intrusive: No No   Psychosocial: Marital Status: Married 32 years. Divorced 2006.  Children/Grandchildren: 2 kids and 5 grandkids.    Living Situation: Live with brother in home house in [place] and spend time with ex-wife and grandkids.  Mental Status/Behavioral Observations: The patient was seen on an outpatient basis in the [facility] PM&R office for the clinical interview unaccompanied. Sensorium/Arousal: Hearing and vision are adequate for the purpose of the the interview and will be adequate for the purpose of the planned cognitive testing.  Patient was alert. Orientation: Intact. Appearance: Appropriate for the setting. Behavior: Attentive, pleasant, cooperative.    Speech/Language: Conversational speech was prosodic, fluent, and well-articulated.  No clear difficulties with auditory-verbal comprehension. Motor: The patient ambulated independently without issue.  No tremor was observed. Social Comportment: Appropriate for the setting. Mood: Euthymic Affect: Congruent Thought Process/Content: Coherent, linear, and goal directed. Ability to Participate in Interview: Readily answered all questions posed during clinical interview with adequate detail regarding personal history. Insight: WNL    SUMMARY / CLINICAL IMPRESSIONS The patient was referred  for neuropsychological evaluation by his provider, Duwaine Russell, NP, on 12/01/2023 who met with the patient for neurology follow-up.  The patient has history of OSA (cPAP compliant), arthritis, prostate cancer (s/p radiation Tx 2019), depression, Pinched cervical nerve root, Pre-diabetes, and hypertension per records.  Difficulties with memory reported during a follow-up with neurology in 2020.  He was already placed  on Namenda  and Aricept  by that time.  Memory difficulties are described primarily as stable and subsequent follow-ups.  September 2025 visit indicated memory is improved.  He is able to complete all ADLs independently.  However, the patient reported that his wife felt his memory issues were getting worse.  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  MMSE score was a 29 out of 30 on that visit, consistent with scores during the prior three follow-up visits conducted roughly 6 months apart (30, 30, 29).   Upon interview, the patient expressed interest in undergoing the evaluation although offered that he suspected there was an ADHD diagnosis.  The provider discussed purpose of the evaluation and offered that the primary reason for the referral appeared to be related to concerns for cognitive decline.  The provider offered that indications of ADHD could be explored but detailed the limitations of establishing a neurodevelopmental disorder and late life.  The patient acknowledged that he did have some concerns about cognitive decline and was willing to proceed.  He expressed interest in undergoing the evaluation to better understand the nature of his cognitive difficulties and to learn what may be done to help manage them.  Subsequent workup by neurology showed mild age-appropriate generalized cerebral atrophy and white matter changes consistent with age-related chronic small vessel disease.  Genotyping was negative for increased risk of AD, consistent with lab work.  The patient is intact with basic and instrumental activities of daily living.  He denied any significant psychiatric difficulties currently.  He described a worsening of premorbid problems involving task completion and initiation along with forgetfulness.  The patient does describe some indications could reflect premorbid issues consistent with ADHD. Mild vascular changes could theoretically compound  these difficulties. Further evaluation with cognitive testing be beneficial to delineate the patient's current cognitive profile and assess for indications of cognitive impairment on objective performance based measures.   DISPOSITION / PLAN The patient has been set up for a formal neuropsychological assessment to objectively assess her cognitive functioning across domains to establish the patient's cognitive profile. This data, in conjunction with information obtained via clinical interview and medical record review, will help clarify likely etiology and guide treatment recommendations. Once data collection and interpretation have been completed, the findings / diagnosis and recommendations will be reviewed and discussed with the patient during a feedback appointment with the neuropsychologist. Based on the collaborative dialogue with the patient during the feedback, recommendations may be adjusted / tailored as needed. A formal report will be produced and provided to the patient and the referring provider.   Diagnosis: Cognitive changes   FULL REPORT TO FOLLOW   Evalene DOROTHA Riff, PsyD [facility]-Clinical Neuropsychology [address] [place], [place] [address] Main: [phone] Fax: [phone] [place] License # [id number]  This report was generated using voice recognition software. While this document has been carefully reviewed, transcription errors may be present. I apologize in advance for any inconvenience. Please contact me if further clarification is needed.  "

## 2024-02-28 ENCOUNTER — Encounter: Payer: Self-pay | Admitting: Psychology

## 2024-03-07 NOTE — Progress Notes (Unsigned)
 "  Paul Moon - 79 y.o. male MRN [id number]  Date of birth: [birth year]  Office Visit Note: Visit Date: 03/08/2024 PCP: Claudene Prentice DELENA Mickey., FNP Referred by: Claudene Prentice DELENA Mickey., FNP  Subjective: No chief complaint on file.  HPI: Paul Moon is a pleasant 79 y.o. male who presents today for bilateral hand and wrist pain.  He is also describing occasional tingling in the hands with associated nocturnal symptoms.  He is overall healthy and active at baseline.  Has not undergone any significant workup or treatments.  He does mention a prior cervical history of potential stenosis, has not undergone any significant treatments for that as well.  Pertinent ROS were reviewed with the patient and found to be negative unless otherwise specified above in HPI.   Visit Reason: bilateral wrist pain and decreased stregth Duration of symptoms:2 + years Hand dominance: right Occupation: retired Diabetic: Yes- prediabetic Smoking: No Heart/Lung History: OSA Blood Thinners: none  Prior Testing/EMG: none Injections (Date): none Treatments: none Prior Surgery: none    Assessment & Plan: Visit Diagnoses:  1. Pain in both wrists   2. Primary osteoarthritis of both first carpometacarpal joints     Plan: Extensive discussion was had with the patient today about his ongoing upper extremity complaints.  X-rays obtained today do show an element of bilateral thumb CMC arthritis which correlates with his ongoing clinical exam.  He also does have some clinical concern for ongoing bilateral carpal tunnel syndrome.  We discussed the underlying etiology and pathophysiology of both thumb CMC arthritis and carpal tunnel syndrome.  We discussed treatment modalities ranging from conservative to surgical.  From a conservative standpoint, we discussed ongoing activity modification, bracing, nonsteroidal anti-inflammatory medication, therapeutic exercise and stretching and possible cortisone injections.  We discussed  anti-inflammatory medication both topical and oral.    Particularly given his cervical history, I have recommended that he undergo bilateral upper extremity electrodiagnostic studies in order to better investigate potential carpal tunnel syndrome at the wrist level.  This also may help delineate if there is an element of double crush syndrome as well.  For the time being, we will have him fitted with bilateral wrist braces to be utilized particularly for his nocturnal symptoms.  He will follow-up with me after the EMGs are complete to review results and discuss appropriate next treatment steps.  He has been seen previously by Dr. Georgina in our office, has been sent to me today for specific hand surgical evaluation.  I spent 30 minutes in the care of this patient today including review of previous documentation, imaging obtained, face-to-face time discussing all options regarding treatment and documenting the encounter.    Follow-up: No follow-ups on file.   Meds & Orders: No orders of the defined types were placed in this encounter.   Orders Placed This Encounter  Procedures   XR Wrist Complete Left   XR Wrist Complete Right     Procedures: No procedures performed      Clinical History: No specialty comments available.  He reports that he quit smoking about 38 years ago. His smoking use included cigarettes. He started smoking about 58 years ago. He has a 30 pack-year smoking history. He has never used smokeless tobacco. No results for input(s): HGBA1C, LABURIC in the last 8760 hours.  Objective:   Vital Signs: There were no vitals taken for this visit.  Physical Exam  Gen: Well-appearing, in no acute distress; non-toxic CV: Regular Rate. Well-perfused. Warm.  Resp:  Breathing unlabored on room air; no wheezing. Psych: Fluid speech in conversation; appropriate affect; normal thought process  Ortho Exam PHYSICAL EXAM:  General: Patient is well appearing and in no  distress.  Skin and Muscle: No skin changes are apparent to hands.  Muscle bulk and contour normal, minimal signs of atrophy.     Range of Motion and Palpation Tests: Mobility is full about the elbows with flexion and extension.  Forearm supination and pronation are 85/85 bilaterally.  Wrist flexion/extension is 75/65 bilaterally.  Digital flexion and extension are full.  Thumb opposition is full to the base of the small fingers bilaterally.    No cords or nodules are palpated.  No triggering is observed.    Moderate tenderness over the thumb CMC articulations is observed.  Finklestein test is negative bilateral.  Neurologic, Vascular, Motor: Sensation is intact to light touch in the median/radial/ulnar distributions.  Tinel's testing negative at carpal tunnel.    Phalen's mildly positive, Derkan's compression mildly positive bilateral  Fingers pink and well perfused.  Capillary refill is brisk.      No results found for: HGBA1C    Imaging: No results found.  Past Medical/Family/Surgical/Social History: Medications & Allergies reviewed per EMR, new medications updated. Patient Active Problem List   Diagnosis Date Noted   Depression 02/14/2021   Frequency of micturition 05/09/2020   OAB (overactive bladder) 01/20/2020   Erectile dysfunction due to arterial insufficiency 01/20/2020   OA (osteoarthritis) of knee 08/23/2019   Primary osteoarthritis of right knee 08/23/2019   Mild cognitive impairment 08/07/2017   Prostate cancer (HCC) 06/18/2016   Meralgia paresthetica of right side 02/18/2014   Acute medial meniscal tear 04/27/2013   Obstructive sleep apnea 04/09/2013   Tremor 03/22/2013   Headache 03/22/2013   Cervical radiculopathy 07/10/2011   Past Medical History:  Diagnosis Date   Arthritis    INDEX FINGER JOINT LEFT HAND, right knee   Bipolar disorder (HCC)    Depression    History of gout 2018   right hand 2 fingers and right wrist-- 05-20-2017 per pt  resolved   Hyperplasia of prostate with lower urinary tract symptoms (LUTS)    Hypertension    OSA on CPAP    per study 03-22-2013  moderate OSA w/ AHI 26.8/hr   Pinched cervical nerve root    PT STATES C 7-8 PINCHED NERVE CAUSING NUMBNESS MIDDLE, RING AND LITTLE FINGER LEFT HAND - NO PAIN - STATES HE IS TRYING TO AVOID SURGERY.   Pre-diabetes    followed by pcp   Prostate cancer [facility]) urologist-  dr thalia  oncologist-  dr patrcia   dx 05-24-2016  Stage T1c, Gleason 4+3, PSA 5.49, vol 65.5cc/  pt seek second opinion's at [facility] and [facility] not candidate for focal laser ablation/  scheduled for radiactive seed implants 05-26-2017   Wears glasses    Family History  Problem Relation Age of Onset   Renal cancer Father    Cancer Paternal Aunt        breast   Heart attack Paternal Grandmother    Heart attack Paternal Grandfather    Sleep apnea Neg Hx    Past Surgical History:  Procedure Laterality Date   CIRCUMCISION  1973 approx.   KNEE ARTHROSCOPY Left 04/28/2013   Procedure: LEFT ARTHROSCOPY KNEE WITH DEBRIDEMENT meniscal debridement;  Surgeon: Dempsey LULLA Moan, MD;  Location: [facility];  Service: Orthopedics;  Laterality: Left;   KNEE SURGERY Right 2008   left knee replacment  2023   MR GUIDED PROSTATE BIOPSY  12-12-2016    [facility] [place] [place])   w/ general anesthesia   PROSTATE BIOPSY  05-29-2016   dr sherrilee office   RADIOACTIVE SEED IMPLANT N/A 05/26/2017   Procedure: RADIOACTIVE SEED IMPLANT/BRACHYTHERAPY IMPLANT;  Surgeon: Sherrilee Belvie CROME, MD;  Location: [facility];  Service: Urology;  Laterality: N/A;   SPACE OAR INSTILLATION N/A 05/26/2017   Procedure: SPACE OAR INSTILLATION;  Surgeon: Sherrilee Belvie CROME, MD;  Location: [facility];  Service: Urology;  Laterality: N/A;   TONSILLECTOMY  1970s   TOTAL KNEE ARTHROPLASTY N/A 08/23/2019   Procedure: TOTAL KNEE ARTHROPLASTY;  Surgeon: Melodi Lerner, MD;  Location: [facility];   Service: Orthopedics;  Laterality: N/A;   Social History   Occupational History   Occupation: Semi-Retired  Tobacco Use   Smoking status: Former    Current packs/day: 0.00    Average packs/day: 1.5 packs/day for 20.0 years (30.0 ttl pk-yrs)    Types: Cigarettes    Start date: 05/26/1965    Quit date: 05/26/1985    Years since quitting: 38.8   Smokeless tobacco: Never  Vaping Use   Vaping status: Never Used  Substance and Sexual Activity   Alcohol use: Yes    Comment: occasional wine   Drug use: No   Sexual activity: Yes    Diem Dicocco Afton Alderton, M.D. [facility], Hand Surgery  "

## 2024-03-08 ENCOUNTER — Ambulatory Visit: Admitting: Orthopedic Surgery

## 2024-03-08 ENCOUNTER — Other Ambulatory Visit: Payer: Self-pay

## 2024-03-08 DIAGNOSIS — M25531 Pain in right wrist: Secondary | ICD-10-CM

## 2024-03-08 DIAGNOSIS — R202 Paresthesia of skin: Secondary | ICD-10-CM

## 2024-03-08 DIAGNOSIS — M180 Bilateral primary osteoarthritis of first carpometacarpal joints: Secondary | ICD-10-CM

## 2024-03-08 DIAGNOSIS — R200 Anesthesia of skin: Secondary | ICD-10-CM | POA: Diagnosis not present

## 2024-03-08 DIAGNOSIS — M25532 Pain in left wrist: Secondary | ICD-10-CM

## 2024-03-08 NOTE — Addendum Note (Signed)
 Addended by: Kayde Warehime G on: 03/08/2024 11:35 AM   Modules accepted: Orders

## 2024-03-11 ENCOUNTER — Encounter

## 2024-03-17 ENCOUNTER — Encounter: Admitting: Psychology

## 2024-03-25 ENCOUNTER — Encounter: Admitting: Psychology

## 2024-03-25 ENCOUNTER — Encounter: Attending: Psychology

## 2024-03-25 DIAGNOSIS — R4189 Other symptoms and signs involving cognitive functions and awareness: Secondary | ICD-10-CM | POA: Insufficient documentation

## 2024-03-25 NOTE — Progress Notes (Unsigned)
 "  Mental Status/Behavioral Observations (03/25/2024):  Orientation: The patient was oriented to self, place, and time. Sensory/Arousal: Hearing was adequate for testing. Vision was adequate with the assistance of corrective lenses. The patient was alert. He appeared to become somewhat fatigued towards the end of the testing session. Appearance: Dress and hygiene were appropriate for the setting.  Speech/Language: In conversation, the patient's speech was prosodic, fluent, and well-articulated. The patient displayed no indications of word finding difficulties and no word substitution errors were observed.  Motor: The patient ambulated independently and without issue. No tremors were observed.  Social Comportment: Social behavior was appropriate for the setting. Mood/Affect: Mood was largely neutral to frustrated. Affect was congruent with mood.  Attention/Concentration: The patient appeared to maintain consistent engagement throughout the testing session. No frank attentional lapses were observed.  Thought Process/Content: The patient's thought process was coherent, linear, goal directed. There were no indications of psychosis.  Additional Observations: The patient showed no difficulties with understanding task instructions. Some difficulties with frustration tolerance were noted, and the patient made occasional comments criticizing his performance on testing. Pace was somewhat slow throughout the testing session.  Neuropsychology Note Paul Moon completed 195 minutes of neuropsychological testing with technician, Josue Ned, BA, under the supervision of Evalene Riff, PsyD., Clinical Neuropsychologist. The patient did not appear overtly distressed by the testing session, per behavioral observation or via self-report to the technician. Rest breaks were offered.   Clinical Decision Making: In considering the patient's current level of functioning, level of presumed impairment, nature of  symptoms, emotional and behavioral responses during clinical interview, level of literacy, and observed level of motivation/effort, a battery of tests was selected by Dr. Riff during initial consultation on 02/24/2024. This was communicated to the technician. Communication between the neuropsychologist and technician was ongoing throughout the testing session and changes were made as deemed necessary based on patient performance on testing, technician observations and additional pertinent factors such as those listed above.  Tests Administered: Automatic Data Edition (BNT-2) Brief Visuospatial Memory Test-Revised (BVMT-R) Clock Drawing Test Controlled Oral Word Association Test (FAS & Animals) Delis-Kaplan Executive Function System (D-KEFS), select subtests Hopkins Verbal Learning Test - Revised (HVLT-R) Repeatable Battery for the Assessment of Neuropsychological Status Update (RBANS) Trail Making Test (TMT; Part A & B) Wechsler Adult Intelligence Scale-Fourth Edition (WAIS-IV), select subtests Wechsler Abbreviated Scale of Intelligence CIVIL SERVICE FAST STREAMER) Wechsler Memory Scale-Fourth Edition (WMS-IV) , select subtests Wechsler Memory Scale-Third Edition (WMS-III), select subtests  Geriatric Depression Scale-Short Form (GDS-SF) Geriatric Anxiety Inventory (GAI)  NEUROPSYCHOLOGICAL TEST RESULTS: Note: This summary of test scores accompanies the interpretive report and should not be interpreted by unqualified individuals or in isolation without reference to the report.   Test scores are relative to age and further adjusted for educational history and demographics as available when appropriate.  Measurement properties of test scores: IQ, Index, and Standard Scores (SS): Mean = 100; Standard Deviation = 15; Scaled Scores (ss): Mean = 10; Standard Deviation = 3; Z scores (Z): Mean = 0; Standard Deviation = 1; T scores (T); Mean = 50; Standard Deviation = 10  Intellectual/Premorbid Functioning  Estimate   Norm Score Percentile  Range  WASI-II FSIQ-2  SS = 123 94 %ile Above Average   Vocabulary   T = 62 88 %ile High Average   Matrix Reasoning   T = 65 94 %ile Above Average   ATTENTION AND WORKING MEMORY    Norm Score Percentile  Range  WAIS-IV  Digit Span  ss = 8 25 %ile Average   DSF  ss = 7 16 %ile Low Average   Span:    5      DSB  ss = 9 37 %ile Average   Span:    4      DSS  ss = 8 25 %ile Average   Span:    4     WMS-III          Spatial Span  ss = 7 16 %ile Low Average   SSF  ss = 8 25 %ile Average   Span:    5      SSB  ss = 7 16 %ile Low Average   Span:    4      PROCESSING SPEED    Norm Score Percentile  Range  WAIS-IV          Coding  ss = 7 16 %ile Low Average   Symbol Search  ss = 10 50 %ile Average   LANGUAGE    Norm Score Percentile  Range  Boston Naming Test (BNT-2)  T = 71 98 %ile Exceptionally High  COWAT          FAS  T = 50 50 %ile Average   Animals  T = 53 61 %ile Average   EXECUTIVE FUNCTIONING    Norm Score Percentile  Range  DKEFS - Color-Word Interference          Color Naming  ss = 7 16 %ile Low Average   Word Reading  ss = 10 50 %ile Average   Inhibition  ss = 9 37 %ile Average   Errors 3 ss = 10 50 %ile Average   Inhibition Switching  ss = 9 37 %ile Average   Errors 3 ss = 10 50 %ile Average  TMT A  T = 53 61 %ile Average  TMT B  T = 59 81 %ile High Average   MEMORY    Norm Score Percentile  Range  BVMT-R          Trial 1 3 T = 42.0 21 %ile Low Average   Trial 2 4 T = 36 8 %ile Below Average   Trial 3 4 T = 31.0 3 %ile Below Average   Total Recall  T = 34.0 5 %ile Below Average   Learning  T = 35.0 7 %ile Below Average   Delayed Recall 5 T = 39.0 13 %ile Low Average   % Retained    125 >16 %ile WNL   Hits 4.0    6-10 %ile Low to Below Average   False Alarms 0    >16 %ile WNL   Recognition Discriminability 4.0    11-16 %ile Low Average  HVLT          Trial 1 7         Trial 2 8         Trial 3 8         Total  Recall  T = 50.0 50 %ile Average   Delayed Recall 6 T = 42 21 %ile Low Average   %Retention  T = 44.0 27 %ile Average   Rec - True Positives 12         Rec - False Positives 4         Recognition Discriminability 8 T = 37.0 9 %ile Low Average  Wechsler Memory Scale, 4th Edition (WMS-4)  Log. Mem. Immediate Recall  ss = 12 75 %ile High Average   Logical Memory Delayed Recall  ss = 13 84 %ile High Average   Logical Recognition    >75th   %ile High Average   VISUAL-SPATIAL    Norm Score Percentile  Range  Clock                   RBANS Visuospatial Index          RBANS Figure Copy  ss = 9 37 %ile Average   RBANS Line Orientation      17th-25th  %ile Average to Low Average   PERSONALITY AND BEHAVIORAL FUNCTIONING      Score/Interpretation  GDS-SF Raw       4  GDS-SF Severity       Minimal.  GAI Raw       7  GAI Severity       Minimal.    Feedback to Patient: Paul Moon will return on 04/08/2024 for an interactive feedback session with Dr. Hayden at which time his test performances, clinical impressions and treatment recommendations will be reviewed in detail. The patient understands he can contact our office should he require our assistance before this time.  195 minutes spent face-to-face with patient administering standardized tests, 45 minutes spent scoring radiographer, therapeutic). [CPT [id number], [id number]]  Full report to follow. "

## 2024-04-01 ENCOUNTER — Encounter: Admitting: Psychology

## 2024-04-06 ENCOUNTER — Telehealth: Payer: Self-pay | Admitting: Urology

## 2024-04-06 NOTE — Telephone Encounter (Signed)
 SABRA

## 2024-04-08 ENCOUNTER — Encounter: Admitting: Psychology

## 2024-04-16 ENCOUNTER — Encounter: Admitting: Psychology

## 2024-05-18 ENCOUNTER — Other Ambulatory Visit

## 2024-05-24 ENCOUNTER — Ambulatory Visit: Admitting: Urology
